# Patient Record
Sex: Male | Born: 1955
Health system: Southern US, Community
[De-identification: ages and names within clinical notes are randomized; demographics above are authoritative.]

## PROBLEM LIST (undated history)

## (undated) DIAGNOSIS — C8589 Other specified types of non-Hodgkin lymphoma, extranodal and solid organ sites: Secondary | ICD-10-CM

## (undated) DIAGNOSIS — K2270 Barrett's esophagus without dysplasia: Secondary | ICD-10-CM

## (undated) DIAGNOSIS — K649 Unspecified hemorrhoids: Secondary | ICD-10-CM

## (undated) DIAGNOSIS — Z8719 Personal history of other diseases of the digestive system: Secondary | ICD-10-CM

## (undated) DIAGNOSIS — C801 Malignant (primary) neoplasm, unspecified: Secondary | ICD-10-CM

## (undated) DIAGNOSIS — D126 Benign neoplasm of colon, unspecified: Secondary | ICD-10-CM

## (undated) HISTORY — PX: VASECTOMY: SHX75

## (undated) HISTORY — PX: NO PAST SURGERIES: SHX2092

## (undated) HISTORY — DX: Malignant (primary) neoplasm, unspecified: C80.1

## (undated) HISTORY — PX: COLONOSCOPY: SHX174

## (undated) HISTORY — DX: Other specified types of non-hodgkin lymphoma, extranodal and solid organ sites: C85.89

## (undated) HISTORY — DX: Barrett's esophagus without dysplasia: K22.70

## (undated) HISTORY — DX: Benign neoplasm of colon, unspecified: D12.6

---

## 1996-08-15 HISTORY — PX: VASECTOMY: SHX75

## 2000-05-15 DIAGNOSIS — D126 Benign neoplasm of colon, unspecified: Secondary | ICD-10-CM

## 2000-05-15 HISTORY — DX: Benign neoplasm of colon, unspecified: D12.6

## 2000-09-20 ENCOUNTER — Encounter (INDEPENDENT_AMBULATORY_CARE_PROVIDER_SITE_OTHER): Payer: Self-pay | Admitting: Specialist

## 2000-09-20 ENCOUNTER — Other Ambulatory Visit: Admission: RE | Admit: 2000-09-20 | Discharge: 2000-09-20 | Payer: Self-pay | Admitting: Gastroenterology

## 2002-05-20 ENCOUNTER — Other Ambulatory Visit: Admission: RE | Admit: 2002-05-20 | Discharge: 2002-05-20 | Payer: Self-pay | Admitting: Dermatology

## 2003-05-09 ENCOUNTER — Ambulatory Visit (HOSPITAL_COMMUNITY): Admission: RE | Admit: 2003-05-09 | Discharge: 2003-05-09 | Payer: Self-pay | Admitting: Pediatrics

## 2004-03-15 ENCOUNTER — Ambulatory Visit (HOSPITAL_COMMUNITY): Admission: RE | Admit: 2004-03-15 | Discharge: 2004-03-15 | Payer: Self-pay | Admitting: Orthopedic Surgery

## 2004-12-01 ENCOUNTER — Emergency Department (HOSPITAL_COMMUNITY): Admission: EM | Admit: 2004-12-01 | Discharge: 2004-12-02 | Payer: Self-pay | Admitting: Emergency Medicine

## 2005-04-08 ENCOUNTER — Emergency Department (HOSPITAL_COMMUNITY): Admission: EM | Admit: 2005-04-08 | Discharge: 2005-04-09 | Payer: Self-pay | Admitting: *Deleted

## 2007-04-06 ENCOUNTER — Ambulatory Visit (HOSPITAL_COMMUNITY): Admission: RE | Admit: 2007-04-06 | Discharge: 2007-04-06 | Payer: Self-pay | Admitting: Family Medicine

## 2008-10-01 ENCOUNTER — Encounter (INDEPENDENT_AMBULATORY_CARE_PROVIDER_SITE_OTHER): Payer: Self-pay | Admitting: *Deleted

## 2010-06-03 ENCOUNTER — Encounter (INDEPENDENT_AMBULATORY_CARE_PROVIDER_SITE_OTHER): Payer: Self-pay | Admitting: *Deleted

## 2010-07-26 ENCOUNTER — Encounter (INDEPENDENT_AMBULATORY_CARE_PROVIDER_SITE_OTHER): Payer: Self-pay | Admitting: *Deleted

## 2010-07-28 ENCOUNTER — Ambulatory Visit: Payer: Self-pay | Admitting: Gastroenterology

## 2010-08-03 ENCOUNTER — Ambulatory Visit: Payer: Self-pay | Admitting: Gastroenterology

## 2010-09-14 NOTE — Letter (Signed)
Summary: Pre Visit Letter Revised  [facility]  [address] [place], [place] [address]   Phone: [phone]  Fax: [phone]        06/03/2010 MRN: [id number] Adam Alvarado [address] [place], [place]  [address]  [place]             Procedure Date:  08-03-10   Welcome to the Gastroenterology Division at [facility].    You are scheduled to see a nurse for your pre-procedure visit on 07-28-10 at 9:00a.m. on the 3rd floor at [facility], [address]  We ask that you try to arrive at our office 15 minutes prior to your appointment time to allow for check-in.  Please take a minute to review the attached form.  If you answer "Yes" to one or more of the questions on the first page, we ask that you call the person listed at your earliest opportunity.  If you answer "No" to all of the questions, please complete the rest of the form and bring it to your appointment.    Your nurse visit will consist of discussing your medical and surgical history, your immediate family medical history, and your medications.   If you are unable to list all of your medications on the form, please bring the medication bottles to your appointment and we will list them.  We will need to be aware of both prescribed and over the counter drugs.  We will need to know exact dosage information as well.    Please be prepared to read and sign documents such as consent forms, a financial agreement, and acknowledgement forms.  If necessary, and with your consent, a friend or relative is welcome to sit-in on the nurse visit with you.  Please bring your insurance card so that we may make a copy of it.  If your insurance requires a referral to see a specialist, please bring your referral form from your primary care physician.  No co-pay is required for this nurse visit.     If you cannot keep your appointment, please call [phone] to cancel or reschedule prior to your appointment date.  This  allows [place] the opportunity to schedule an appointment for another patient in need of care.    Thank you for choosing [facility] for your medical needs.  We appreciate the opportunity to care for you.  Please visit [place] at our website  to learn more about our practice.  Sincerely, The Gastroenterology Division

## 2010-09-16 NOTE — Miscellaneous (Signed)
Summary: [facility] Previsit/prep  Clinical Lists Changes  Medications: Added new medication of MOVIPREP 100 GM  SOLR (PEG-KCL-NACL-NASULF-NA ASC-C) As per prep instructions. - Signed Rx of MOVIPREP 100 GM  SOLR (PEG-KCL-NACL-NASULF-NA ASC-C) As per prep instructions.;  #1 x 0;  Signed;  Entered by: Wyona Almas RN;  Authorized by: Meryl Dare MD [facility];  Method used: Electronically to [facility] [address], [place], [place], [place]  [address], Ph: [phone], Fax: [phone] Observations: Added new observation of NKA: T (07/28/2010 8:49)    Prescriptions: MOVIPREP 100 GM  SOLR (PEG-KCL-NACL-NASULF-NA ASC-C) As per prep instructions.  #1 x 0   Entered by:   Wyona Almas RN   Authorized by:   Meryl Dare MD [facility]   Signed by:   Wyona Almas RN on 07/28/2010   Method used:   Electronically to        [facility]* (retail)       [address]       [place], [place]  [address]       Ph: [phone]       Fax: [phone]   RxID:   [id number]

## 2010-09-16 NOTE — Procedures (Signed)
Summary: Colonoscopy  Patient: Adam Alvarado Note: All result statuses are Final unless otherwise noted.  Tests: (1) Colonoscopy (COL)   COL Colonoscopy           DONE     [facility]     [address]     [place], [place]  [address]           COLONOSCOPY PROCEDURE REPORT     PATIENT:  Adam, Alvarado  MR#:  [id number]     BIRTHDATE:  1956-01-30, 54 yrs. old  GENDER:  male     ENDOSCOPIST:  Judie Petit T. Russella Dar, MD, [facility]           PROCEDURE DATE:  08/03/2010     PROCEDURE:  Colon w/ inj sclerosis of hemorrhoids     ASA CLASS:  Class I     INDICATIONS:  1) surveillance and high-risk screening  2) history     of adenomatous colon polyps: 05/2000     MEDICATIONS:   Fentanyl 100 mcg IV, Versed 12 mg     DESCRIPTION OF PROCEDURE:   After the risks benefits and     alternatives of the procedure were thoroughly explained, informed     consent was obtained.  Digital rectal exam was performed and     revealed external hemorrhoids.   The LB [id number]-[id number]     endoscope was introduced through the anus and advanced to the     cecum, which was identified by both the appendix and ileocecal     valve, without limitations.  The quality of the prep was good,     using MoviPrep.  The instrument was then slowly withdrawn as the     colon was fully examined.     <<PROCEDUREIMAGES>>     FINDINGS:  A normal appearing cecum, ileocecal valve, and     appendiceal orifice were identified. The ascending, hepatic     flexure, transverse, splenic flexure, descending, sigmoid colon,     and rectum appeared unremarkable.  Internal hemorrhoids were     found. They were moderately sized. Injection sclerosis of internal     hemorrhoids with 2.5 cc of 23.4% saline injected above the dentate     line. Retroflexed views in the rectum revealed no other findings     other than those already described. The time to cecum =  3     minutes. The scope was then withdrawn (time =  11.5  min) from the     patient  and the procedure completed.           COMPLICATIONS: None           ENDOSCOPIC IMPRESSION:     1) Normal colon     2) Internal and external hemorrhoids           RECOMMENDATIONS:     1) Repeat Colonoscopy in 5 years.           Venita Lick. Russella Dar, MD, [facility]           CC: Milford Cage Francoise Schaumann MD           n.     Rosalie DoctorVenita Lick. Stark at 08/03/2010 09:31 AM           Corney, Perlie Gold, [phone]  Note: An exclamation mark (!) indicates a result that was not dispersed into the flowsheet. Document Creation Date: 08/03/2010 9:31 AM _______________________________________________________________________  (1) Order result status: Final Collection or observation date-time: 08/03/2010  09:26 Requested date-time:  Receipt date-time:  Reported date-time:  Referring Physician:   Ordering Physician: Claudette Head [phone]) Specimen Source:  Source: Launa Grill Order Number: [id number] Lab site:   Appended Document: Colonoscopy    Clinical Lists Changes  Observations: Added new observation of COLONNXTDUE: 07/2015 (08/03/2010 9:36)

## 2010-09-16 NOTE — Letter (Signed)
Summary: [facility] Instructions  [facility]  [address] [place], [place] [address]   Phone: [phone]  Fax: [phone]       Adam Alvarado    02/23/1956    MRN: [id number]        Procedure Day /Date:TUESDAY  08/03/10     Arrival Time:  8:00AM     Procedure Time:  9:00AM     Location of Procedure:                    [name] _  [facility] (4th Floor)                      PREPARATION FOR COLONOSCOPY WITH MOVIPREP   Starting 5 days prior to your procedure 07/29/10 do not eat nuts, seeds, popcorn, corn, beans, peas,  salads, or any raw vegetables.  Do not take any fiber supplements (e.g. Metamucil, Citrucel, and Benefiber).  THE DAY BEFORE YOUR PROCEDURE         DATE: 08/02/10  DAY: MONDAY  1.  Drink clear liquids the entire day-NO SOLID FOOD  2.  Do not drink anything colored red or purple.  Avoid juices with pulp.  No orange juice.  3.  Drink at least 64 oz. (8 glasses) of fluid/clear liquids during the day to prevent dehydration and help the prep work efficiently.  CLEAR LIQUIDS INCLUDE: Water Jello Ice Popsicles Tea (sugar ok, no milk/cream) Powdered fruit flavored drinks Coffee (sugar ok, no milk/cream) Gatorade Juice: apple, white grape, white cranberry  Lemonade Clear bullion, consomm, broth Carbonated beverages (any kind) Strained chicken noodle soup Hard Candy                             4.  In the morning, mix first dose of MoviPrep solution:    Empty 1 Pouch A and 1 Pouch B into the disposable container    Add lukewarm drinking water to the top line of the container. Mix to dissolve    Refrigerate (mixed solution should be used within 24 hrs)  5.  Begin drinking the prep at 5:00 p.m. The MoviPrep container is divided by 4 marks.   Every 15 minutes drink the solution down to the next mark (approximately 8 oz) until the full liter is complete.   6.  Follow completed prep with 16 oz of clear liquid of your choice (Nothing  red or purple).  Continue to drink clear liquids until bedtime.  7.  Before going to bed, mix second dose of MoviPrep solution:    Empty 1 Pouch A and 1 Pouch B into the disposable container    Add lukewarm drinking water to the top line of the container. Mix to dissolve    Refrigerate  THE DAY OF YOUR PROCEDURE      DATE: 08/03/10   DAY: TUESDAY  Beginning at 4:00AM (5 hours before procedure):         1. Every 15 minutes, drink the solution down to the next mark (approx 8 oz) until the full liter is complete.  2. Follow completed prep with 16 oz. of clear liquid of your choice.    3. You may drink clear liquids until 7:00AM (2 HOURS BEFORE PROCEDURE).   MEDICATION INSTRUCTIONS  Unless otherwise instructed, you should take regular prescription medications with a small sip of water   as early as possible the morning of your procedure.  OTHER INSTRUCTIONS  You will need a responsible adult at least 54 years of age to accompany you and drive you home.   This person must remain in the waiting room during your procedure.  Wear loose fitting clothing that is easily removed.  Leave jewelry and other valuables at home.  However, you may wish to bring a book to read or  an iPod/MP3 player to listen to music as you wait for your procedure to start.  Remove all body piercing jewelry and leave at home.  Total time from sign-in until discharge is approximately 2-3 hours.  You should go home directly after your procedure and rest.  You can resume normal activities the  day after your procedure.  The day of your procedure you should not:   Drive   Make legal decisions   Operate machinery   Drink alcohol   Return to work  You will receive specific instructions about eating, activities and medications before you leave.    The above instructions have been reviewed and explained to me by  Adam Almas RN  July 28, 2010 9:33 AM     I fully understand and  can verbalize these instructions _____________________________ Date _________

## 2010-12-31 NOTE — Procedures (Signed)
   NAME:  Adam Alvarado, Adam Alvarado                         ACCOUNT NO.:  [id number]   MEDICAL RECORD NO.:  [id number]                   PATIENT TYPE:  OUT   LOCATION:  [facility]                                 FACILITY:  [facility]   PHYSICIAN:  Francoise Schaumann. Halm, D.O.                DATE OF BIRTH:  1956/07/03   DATE OF PROCEDURE:  05/09/2003  DATE OF DISCHARGE:                                    STRESS TEST   REASON FOR STUDY:  Near-syncopal episode.   BRIEF HISTORY:  The patient is a 55 year old gentleman who experienced near  syncope while cutting down a tree within weeks prior to this study.  The  study is performed to rule out myocardial ischemia as a cause of his near  syncope.   The patient underwent Bruce protocol exercise treadmill testing under  standardized conditions.  He had normal initial EKG with no evidence of LVH  or bundle-branch block.  His initial blood pressure was normal.  The patient  signed the informed consent form.  The patient underwent Bruce protocol  treadmill testing and had excellent tolerance to exercise.  The study was  stopped due to the patient reaching his target heart rate of 148.  The  patient actually exceeded his heart rate.  He had no recurrence of his  symptoms.  The patient had normal hemodynamic response to exercise.  EKG at  maximal exertion did show 1 mm ST depression which resolved quickly within  one minute of recovery.  The patient had no symptoms during the study  consistent with ischemia.  There was also no dysrhythmia noted during  exercise.   CONCLUSION:  Optimal treadmill exercise test with no evidence of ischemia.      ___________________________________________                                            Francoise Schaumann. Milford Cage, D.O.   [facility]/MEDQ  D:  05/09/2003  T:  05/09/2003  Job:  [id number]

## 2011-08-16 DIAGNOSIS — C801 Malignant (primary) neoplasm, unspecified: Secondary | ICD-10-CM

## 2011-08-16 DIAGNOSIS — C8589 Other specified types of non-Hodgkin lymphoma, extranodal and solid organ sites: Secondary | ICD-10-CM

## 2011-08-16 HISTORY — DX: Malignant (primary) neoplasm, unspecified: C80.1

## 2011-08-16 HISTORY — DX: Other specified types of non-hodgkin lymphoma, extranodal and solid organ sites: C85.89

## 2012-07-05 ENCOUNTER — Other Ambulatory Visit (HOSPITAL_COMMUNITY): Payer: Self-pay | Admitting: Internal Medicine

## 2012-07-05 DIAGNOSIS — R198 Other specified symptoms and signs involving the digestive system and abdomen: Secondary | ICD-10-CM

## 2012-07-10 ENCOUNTER — Ambulatory Visit (HOSPITAL_COMMUNITY)
Admission: RE | Admit: 2012-07-10 | Discharge: 2012-07-10 | Disposition: A | Discharge status: Home / Self Care | Payer: BC Managed Care – PPO | Source: Ambulatory Visit | Attending: Internal Medicine | Admitting: Internal Medicine

## 2012-07-10 ENCOUNTER — Other Ambulatory Visit (HOSPITAL_COMMUNITY): Payer: Self-pay | Admitting: Internal Medicine

## 2012-07-10 DIAGNOSIS — K824 Cholesterolosis of gallbladder: Secondary | ICD-10-CM | POA: Insufficient documentation

## 2012-07-10 DIAGNOSIS — R198 Other specified symptoms and signs involving the digestive system and abdomen: Secondary | ICD-10-CM

## 2012-07-10 DIAGNOSIS — R1900 Intra-abdominal and pelvic swelling, mass and lump, unspecified site: Secondary | ICD-10-CM | POA: Insufficient documentation

## 2012-07-10 MED ORDER — IOHEXOL 300 MG/ML  SOLN
100.0000 mL | Freq: Once | INTRAMUSCULAR | Status: AC | PRN
  Administered 2012-07-10: 100 mL via INTRAVENOUS

## 2012-07-11 ENCOUNTER — Other Ambulatory Visit: Payer: Self-pay | Admitting: *Deleted

## 2012-07-11 ENCOUNTER — Telehealth: Payer: Self-pay | Admitting: Oncology

## 2012-07-11 DIAGNOSIS — R1900 Intra-abdominal and pelvic swelling, mass and lump, unspecified site: Secondary | ICD-10-CM

## 2012-07-11 NOTE — Telephone Encounter (Signed)
S/W pt in re NP appt 12/04 @ 11:30 w/Dr. Truett Perna.  Referring Dr. Ouida Sills Dx-Lung Mass Welcome packet mailed.

## 2012-07-17 ENCOUNTER — Telehealth: Payer: Self-pay | Admitting: Oncology

## 2012-07-17 ENCOUNTER — Ambulatory Visit (HOSPITAL_COMMUNITY): Payer: BC Managed Care – PPO | Admitting: Oncology

## 2012-07-17 NOTE — Telephone Encounter (Signed)
C/D 07/17/12 for appt 07/18/12

## 2012-07-18 ENCOUNTER — Other Ambulatory Visit: Payer: BC Managed Care – PPO

## 2012-07-18 ENCOUNTER — Ambulatory Visit (HOSPITAL_BASED_OUTPATIENT_CLINIC_OR_DEPARTMENT_OTHER): Payer: BC Managed Care – PPO | Admitting: Oncology

## 2012-07-18 ENCOUNTER — Ambulatory Visit: Payer: BC Managed Care – PPO

## 2012-07-18 ENCOUNTER — Other Ambulatory Visit (HOSPITAL_BASED_OUTPATIENT_CLINIC_OR_DEPARTMENT_OTHER): Payer: BC Managed Care – PPO

## 2012-07-18 ENCOUNTER — Telehealth: Payer: Self-pay | Admitting: Oncology

## 2012-07-18 VITALS — BP 150/78 | HR 61 | Temp 97.7°F | Resp 18 | Ht 70.5 in | Wt 230.0 lb

## 2012-07-18 DIAGNOSIS — R1900 Intra-abdominal and pelvic swelling, mass and lump, unspecified site: Secondary | ICD-10-CM

## 2012-07-18 LAB — COMPREHENSIVE METABOLIC PANEL (CC13)
Albumin: 4.2 g/dL (ref 3.5–5.0)
Alkaline Phosphatase: 96 U/L (ref 40–150)
BUN: 14 mg/dL (ref 7.0–26.0)
Calcium: 9.9 mg/dL (ref 8.4–10.4)
Glucose: 98 mg/dl (ref 70–99)
Potassium: 4.6 mEq/L (ref 3.5–5.1)
Sodium: 142 mEq/L (ref 136–145)

## 2012-07-18 LAB — CBC WITH DIFFERENTIAL/PLATELET
Basophils Absolute: 0 10*3/uL (ref 0.0–0.1)
Eosinophils Absolute: 0.1 10*3/uL (ref 0.0–0.5)
HCT: 48 % (ref 38.4–49.9)
HGB: 16 g/dL (ref 13.0–17.1)
LYMPH%: 12 % — ABNORMAL LOW (ref 14.0–49.0)
MONO%: 12.7 % (ref 0.0–14.0)
NEUT#: 4.7 10*3/uL (ref 1.5–6.5)
Platelets: 225 10*3/uL (ref 140–400)
RBC: 5.48 10*6/uL (ref 4.20–5.82)
WBC: 6.3 10*3/uL (ref 4.0–10.3)

## 2012-07-18 NOTE — Progress Notes (Signed)
[facility] New Patient Consult   Referring MD: Dayron Odland Brierley 56 y.o.  Nov 02, 1955    Reason for Referral: Abdominal mass     HPI: He saw Dr. Ouida Sills to establish as the patient and on physical exam a left abdominal fullness was noted. An abdominal ultrasound on 07/10/2012 revealed a heterogenous mass in the vicinity of the pancreas and central abdomen measuring 19.3 x 10 cm. A CT was recommended. A CT on the same day revealed a normal liver, spleen, gallbladder, pancreas, and adrenal glands. A large mass was noted in the mid abdomen centrally and to the left of midline displacing bowel the blood vessels extending through the mass without evidence of encasement or displacement. Surround the mass numerous small oval soft tissue nodules were consistent with lymph nodes. Small retroperitoneal nodes with the largest measuring 18 x 6 mm in the periaortic region. The CT findings were felt to be most consistent with lymphoma.  Mr. Flenner feels well.  Past medical history: None  Past surgical history: Mastectomy  Current medications: None  Family history: His mother had breast cancer in her 13s. A second cousin had non-Hodgkin's lymphoma. No other family history of cancer.  Allergies: No Known Allergies  Social History: He works in [employer]. He does not smoke. Rare alcohol use. No transfusion history. No risk factor for HIV or hepatitis. No unusual exposure a side from embalming chemicals.   ROS:   Positives include: Few night sweats over the past several months, increased flatulence and intermittent loose stool, "gags "when brushing his tongue and vomits occasionally after brushing his tongue for the past few months. He had a "viral "illness for a few days in September  A complete ROS was otherwise negative.  Physical Exam:  Blood pressure 150/78, pulse 61, temperature 97.7 F (36.5 C), temperature source Oral, resp. rate 18, height 5'  10.5" (1.791 m), weight 230 lb (104.327 kg).  HEENT: Oropharynx without visible mass, neck without mass Lungs: Clear bilaterally Cardiac: Regular rate and rhythm Abdomen: There is a mass filling the majority of the left abdomen. No hepatomegaly, no apparent ascites GU: Testes without mass  Vascular: No leg edema Lymph nodes: No cervical, supraclavicular, axillary, or inguinal nodes Neurologic: Alert and oriented, the motor exam appears intact in the upper and lower extremities Skin: No rash Musculoskeletal: No spine tenderness   LAB:  CBC  Lab Results  Component Value Date   WBC 6.3 07/18/2012   HGB 16.0 07/18/2012   HCT 48.0 07/18/2012   MCV 87.5 07/18/2012   PLT 225 07/18/2012   ANC 4.7  CMP      Component Value Date/Time   NA 142 07/18/2012 1054   K 4.6 07/18/2012 1054   CL 102 07/18/2012 1054   CO2 30* 07/18/2012 1054   GLUCOSE 98 07/18/2012 1054   BUN 14.0 07/18/2012 1054   CREATININE 1.3 07/18/2012 1054   CALCIUM 9.9 07/18/2012 1054   PROT 7.1 07/18/2012 1054   ALBUMIN 4.2 07/18/2012 1054   AST 18 07/18/2012 1054   ALT 20 07/18/2012 1054   ALKPHOS 96 07/18/2012 1054   BILITOT 0.53 07/18/2012 1054   LDH 196  Radiology: I reviewed the abdominal CT from 07/10/2012 with Mr. Nygard and his wife. There is a large abdominal mass centered to the left of midline.    Assessment/Plan:   1. Large Abdominal mass with surrounding small lymph nodes  2. Episodes of vomiting after brushing his tongue,?  Related to the abdominal mass   Disposition:   He was discovered to have a large abdominal mass when he was seen for a routine physical. I reviewed the CT images and discussed the differential diagnosis with Mr. Kirkendall and his wife. The differential diagnosis includes lymphoma and other malignancies. The abdominal mass appears to be amenable to a percutaneous core biopsy. He will be referred to interventional radiology for a core biopsy to be sent for a lymphoma evaluation.  Mr. Mcgrail  will return for an office visit on 07/26/2012. We will initiate additional staging evaluation based on the biopsy result.  Walsie Smeltz 07/18/2012, 8:21 PM

## 2012-07-18 NOTE — Telephone Encounter (Signed)
gv pt appt schedule for December. Pt aware central will contact him w/appt for bx. Per pt he has already been to [facility] and BS asked him to let the schedulers know to disregard request on pof to send him to [facility].

## 2012-07-19 ENCOUNTER — Other Ambulatory Visit (HOSPITAL_COMMUNITY): Payer: Self-pay | Admitting: Oncology

## 2012-07-19 ENCOUNTER — Other Ambulatory Visit: Payer: Self-pay | Admitting: Radiology

## 2012-07-20 ENCOUNTER — Encounter (HOSPITAL_COMMUNITY): Payer: Self-pay

## 2012-07-20 ENCOUNTER — Ambulatory Visit (HOSPITAL_COMMUNITY)
Admission: RE | Admit: 2012-07-20 | Discharge: 2012-07-20 | Disposition: A | Discharge status: Home / Self Care | Payer: BC Managed Care – PPO | Source: Ambulatory Visit | Attending: Oncology | Admitting: Oncology

## 2012-07-20 DIAGNOSIS — R1900 Intra-abdominal and pelvic swelling, mass and lump, unspecified site: Secondary | ICD-10-CM

## 2012-07-20 DIAGNOSIS — Z807 Family history of other malignant neoplasms of lymphoid, hematopoietic and related tissues: Secondary | ICD-10-CM | POA: Insufficient documentation

## 2012-07-20 DIAGNOSIS — Z803 Family history of malignant neoplasm of breast: Secondary | ICD-10-CM | POA: Insufficient documentation

## 2012-07-20 DIAGNOSIS — R1909 Other intra-abdominal and pelvic swelling, mass and lump: Secondary | ICD-10-CM | POA: Insufficient documentation

## 2012-07-20 LAB — PROTIME-INR
INR: 1.05 (ref 0.00–1.49)
Prothrombin Time: 13.6 seconds (ref 11.6–15.2)

## 2012-07-20 MED ORDER — MIDAZOLAM HCL 2 MG/2ML IJ SOLN
INTRAMUSCULAR | Status: AC
  Filled 2012-07-20: qty 4

## 2012-07-20 MED ORDER — HYDROCODONE-ACETAMINOPHEN 5-325 MG PO TABS
1.0000 | ORAL_TABLET | ORAL | Status: DC | PRN
  Filled 2012-07-20: qty 2

## 2012-07-20 MED ORDER — FENTANYL CITRATE 0.05 MG/ML IJ SOLN
INTRAMUSCULAR | Status: AC
  Filled 2012-07-20: qty 4

## 2012-07-20 MED ORDER — SODIUM CHLORIDE 0.9 % IV SOLN
Freq: Once | INTRAVENOUS | Status: AC
  Administered 2012-07-20: 08:00:00 via INTRAVENOUS

## 2012-07-20 MED ORDER — MIDAZOLAM HCL 2 MG/2ML IJ SOLN
INTRAMUSCULAR | Status: AC | PRN
  Administered 2012-07-20: 2 mg via INTRAVENOUS

## 2012-07-20 MED ORDER — FENTANYL CITRATE 0.05 MG/ML IJ SOLN
INTRAMUSCULAR | Status: AC | PRN
  Administered 2012-07-20: 100 ug via INTRAVENOUS

## 2012-07-20 NOTE — H&P (Signed)
Agree 

## 2012-07-20 NOTE — H&P (Signed)
Adam Alvarado is an 56 y.o. male.   Chief Complaint: abdominal mass HPI: Patient with history of large mid abdominal mass suspicious for lymphoma presents today for CT guided biopsy.  ZOX:WRUE AVW:UJWJXBJYN FH:    Mother had breast cancer; second cousin with NHL.                                                                                                                                                                                                                                    Social History:  reports that he has never smoked. He has never used smokeless tobacco. 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   Allergies: No Known Allergies  No current outpatient prescriptions on file. Current facility-administered medications:[COMPLETED] 0.9 %  sodium chloride infusion, , Intravenous, Once, Brayton El, PA, Last Rate: 20 mL/hr at 07/20/12 0800   Results for orders placed in visit on 07/18/12 (from the past 48 hour(s))  CBC WITH DIFFERENTIAL     Status: Abnormal   Collection Time   07/18/12 10:54 AM      Component Value Range Comment   WBC 6.3  4.0 - 10.3 10e3/uL    NEUT# 4.7  1.5 - 6.5 10e3/uL    HGB 16.0  13.0 - 17.1 g/dL    HCT 82.9  56.2 - 13.0 %    Platelets 225  140 - 400 10e3/uL    MCV 87.5  79.3 - 98.0 fL    MCH 29.2  27.2 - 33.4 pg    MCHC 33.3  32.0 - 36.0 g/dL    RBC 8.65  7.84 - 6.96 10e6/uL    RDW 13.4  11.0 - 14.6 %    lymph# 0.8 (*) 0.9 - 3.3 10e3/uL    MONO# 0.8  0.1 - 0.9 10e3/uL    Eosinophils Absolute 0.1  0.0 - 0.5 10e3/uL    Basophils Absolute 0.0  0.0 - 0.1 10e3/uL    NEUT% 73.7  39.0 - 75.0 %    LYMPH% 12.0 (*) 14.0 - 49.0 %    MONO% 12.7  0.0 - 14.0 %    EOS% 1.1  0.0 - 7.0 %    BASO% 0.5  0.0 - 2.0 %   LACTATE DEHYDROGENASE (CC13)     Status: Normal   Collection Time   07/18/12 10:54 AM      Component Value  Range Comment   LDH 196  125 - 245 U/L 06/21/12 - NOTE new reference range.  COMPREHENSIVE  METABOLIC PANEL (CC13)     Status: Abnormal   Collection Time   07/18/12 10:54 AM      Component Value Range Comment   Sodium 142  136 - 145 mEq/L    Potassium 4.6  3.5 - 5.1 mEq/L    Chloride 102  98 - 107 mEq/L    CO2 30 (*) 22 - 29 mEq/L    Glucose 98  70 - 99 mg/dl    BUN 16.1  7.0 - 09.6 mg/dL    Creatinine 1.3  0.7 - 1.3 mg/dL    Total Bilirubin 0.45  0.20 - 1.20 mg/dL    Alkaline Phosphatase 96  40 - 150 U/L    AST 18  5 - 34 U/L    ALT 20  0 - 55 U/L    Total Protein 7.1  6.4 - 8.3 g/dL    Albumin 4.2  3.5 - 5.0 g/dL    Calcium 9.9  8.4 - 40.9 mg/dL    Results for orders placed in visit on 07/18/12  CBC WITH DIFFERENTIAL      Component Value Range   WBC 6.3  4.0 - 10.3 10e3/uL   NEUT# 4.7  1.5 - 6.5 10e3/uL   HGB 16.0  13.0 - 17.1 g/dL   HCT 81.1  91.4 - 78.2 %   Platelets 225  140 - 400 10e3/uL   MCV 87.5  79.3 - 98.0 fL   MCH 29.2  27.2 - 33.4 pg   MCHC 33.3  32.0 - 36.0 g/dL   RBC 9.56  2.13 - 0.86 10e6/uL   RDW 13.4  11.0 - 14.6 %   lymph# 0.8 (*) 0.9 - 3.3 10e3/uL   MONO# 0.8  0.1 - 0.9 10e3/uL   Eosinophils Absolute 0.1  0.0 - 0.5 10e3/uL   Basophils Absolute 0.0  0.0 - 0.1 10e3/uL   NEUT% 73.7  39.0 - 75.0 %   LYMPH% 12.0 (*) 14.0 - 49.0 %   MONO% 12.7  0.0 - 14.0 %   EOS% 1.1  0.0 - 7.0 %   BASO% 0.5  0.0 - 2.0 %  LACTATE DEHYDROGENASE (CC13)      Component Value Range   LDH 196  125 - 245 U/L  COMPREHENSIVE METABOLIC PANEL (CC13)      Component Value Range   Sodium 142  136 - 145 mEq/L   Potassium 4.6  3.5 - 5.1 mEq/L   Chloride 102  98 - 107 mEq/L   CO2 30 (*) 22 - 29 mEq/L   Glucose 98  70 - 99 mg/dl   BUN 57.8  7.0 - 46.9 mg/dL   Creatinine 1.3  0.7 - 1.3 mg/dL   Total Bilirubin 6.29  0.20 - 1.20 mg/dL   Alkaline Phosphatase 96  40 - 150 U/L   AST 18  5 - 34 U/L   ALT 20  0 - 55 U/L   Total Protein 7.1  6.4 - 8.3 g/dL   Albumin 4.2  3.5 - 5.0 g/dL   Calcium 9.9  8.4 - 52.8 mg/dL   PT/PTT pending  41/10/2438 Review of Systems   Constitutional: Negative for fever and weight loss.       Few night sweats over last several months  Respiratory: Negative for cough and shortness of breath.   Cardiovascular: Negative for chest pain.  Gastrointestinal: Negative for nausea  and abdominal pain.       Occ vomiting after brushing tongue  Musculoskeletal: Negative for back pain.  Neurological: Negative for headaches.  Endo/Heme/Allergies: Does not bruise/bleed easily.    Blood pressure 150/69, pulse 59, temperature 98.3 F (36.8 C), temperature source Oral, resp. rate 16, height 5\' 10"  (1.778 m), weight 230 lb (104.327 kg), SpO2 98.00%. Physical Exam  Constitutional: He is oriented to person, place, and time. He appears well-developed and well-nourished.  Cardiovascular: Normal rate and regular rhythm.   Respiratory: Effort normal and breath sounds normal.  GI: Soft. Bowel sounds are normal.       Palpable abdominal mass, sl to left of midline  Musculoskeletal: Normal range of motion. He exhibits no edema.  Neurological: He is alert and oriented to person, place, and time.     Assessment/Plan: Pt with large mid abdominal mass . Plan is for CT guided biopsy of the abdominal mass today. Details/risks of procedure d/w pt/wife with their understanding and consent. ALLRED,D KEVIN 07/20/2012, 7:58 AM

## 2012-07-20 NOTE — Procedures (Signed)
Procedure:  CT guided core biopsy of mesenteric mass Findings:  Solid 18 G core biopsy x 6 via 17 G needle of large left mesenteric mass.  No complications.

## 2012-07-26 ENCOUNTER — Telehealth: Payer: Self-pay | Admitting: Oncology

## 2012-07-26 ENCOUNTER — Ambulatory Visit (HOSPITAL_BASED_OUTPATIENT_CLINIC_OR_DEPARTMENT_OTHER): Payer: BC Managed Care – PPO | Admitting: Oncology

## 2012-07-26 VITALS — BP 134/79 | HR 64 | Temp 97.9°F | Resp 18 | Ht 70.0 in | Wt 224.0 lb

## 2012-07-26 DIAGNOSIS — R197 Diarrhea, unspecified: Secondary | ICD-10-CM

## 2012-07-26 DIAGNOSIS — R1900 Intra-abdominal and pelvic swelling, mass and lump, unspecified site: Secondary | ICD-10-CM

## 2012-07-26 DIAGNOSIS — R1110 Vomiting, unspecified: Secondary | ICD-10-CM

## 2012-07-26 DIAGNOSIS — C8589 Other specified types of non-Hodgkin lymphoma, extranodal and solid organ sites: Secondary | ICD-10-CM

## 2012-07-26 NOTE — Progress Notes (Signed)
   Adam Alvarado    OFFICE PROGRESS NOTE   INTERVAL HISTORY:   He returns as scheduled. He underwent a CT-guided biopsy of the abdominal mass on 07/20/2012. He reports tolerating the procedure well. The pathology revealed "lymphoid atypia "consistent with a B-cell non-Hodgkin's lymphoma.  He reports fullness in the left abdomen. He had one episode of emesis since the last office visit. He now has frequent "loose "stools after eating.  Objective:  Vital signs in last 24 hours:  Blood pressure 134/79, pulse 64, temperature 97.9 F (36.6 C), temperature source Oral, resp. rate 18, height 5\' 10"  (1.778 m), weight 224 lb (101.606 kg).  Resp: Lungs clear bilaterally Cardio: Regular rate and rhythm GI: Palpable mass in the left abdomen, no hepatomegaly Vascular: No leg edema   Lab Results:  Lab Results  Component Value Date   WBC 6.3 07/18/2012   HGB 16.0 07/18/2012   HCT 48.0 07/18/2012   MCV 87.5 07/18/2012   PLT 225 07/18/2012   BUN 14, creatinine 1.3, LDH 196 on 07/18/2012    Medications: I have reviewed the patient's current medications.  Assessment/Plan: 1. Large Abdominal mass with surrounding small lymph nodes -status post a CT-guided biopsy on 07/20/2012 with the pathology suggestive of non-Hodgkin's lymphoma. 2. Episodes of vomiting after brushing his tongue,? Related to the abdominal mass  3. Loose stools after eating-potentially related to the abdominal mass  Disposition:  He appears stable. I discussed the biopsy findings with Dr. Laureen Ochs of hematopathology. He indicates a definitive diagnosis of lymphoma cannot be made off of the core biopsy material. He recommends an incisional biopsy.  I contacted Dr. Derrell Lolling and he graciously agreed to see Adam Alvarado on 07/30/2012. He will schedule the biopsy after this visit. Adam Alvarado will return for an office visit after the biopsy to formulate a treatment plan. A bone marrow biopsy and staging PET scan will be  scheduled as indicated based on the final pathology result.  He is scheduled for a return visit here on 08/10/2012. He will contact us in the interim for new symptoms.  We discussed the likely need for treatment with systemic therapy.  Approximately 30 minutes were spent with patient today. The majority of the time was spent in counseling/coordination of care.   [name], MD  07/26/2012  8:08 PM

## 2012-07-26 NOTE — Telephone Encounter (Signed)
Gave pt appt for December 2013 MD visit only , pt will see Dr. Derrell Lolling on 07/30/12

## 2012-07-30 ENCOUNTER — Encounter (INDEPENDENT_AMBULATORY_CARE_PROVIDER_SITE_OTHER): Payer: Self-pay | Admitting: General Surgery

## 2012-07-30 ENCOUNTER — Ambulatory Visit (INDEPENDENT_AMBULATORY_CARE_PROVIDER_SITE_OTHER): Payer: BC Managed Care – PPO | Admitting: General Surgery

## 2012-07-30 VITALS — BP 148/86 | HR 71 | Temp 98.2°F | Resp 16 | Ht 70.0 in | Wt 226.0 lb

## 2012-07-30 DIAGNOSIS — R1902 Left upper quadrant abdominal swelling, mass and lump: Secondary | ICD-10-CM | POA: Insufficient documentation

## 2012-07-30 NOTE — Patient Instructions (Signed)
You have an abdominal mass in the left upper quadrant which is almost certainly some type of lymphoma. We cannot find any lymph nodes elsewhere to biopsy.  You'll be scheduled for laparoscopy and biopsy of this mass, possible open laparotomy in the near future. He will need to stay in the hospital a night or two after the surgery for observation.

## 2012-07-30 NOTE — Progress Notes (Signed)
Patient ID: Adam Alvarado, male   DOB: 1955-12-19, 56 y.o.   MRN: [id number]  No chief complaint on file.   HPI Adam Alvarado is a 56 y.o. male.  He is referred by Dr. Mancel Bale for evaluation of abdominal mass, presumed lymphoma, for laparoscopic versus open biopsy. His primary care physician is Dr. [name].  The patient is asymptomatic. No weight loss. No night sweats. No bowel pain or change in his GI habits. Dr. Ouida Sills felt something in his upper abdomen. This led to an ultrasound and a CT scan. CT scan shows a 12 x 12 x 20 cm mass in the upper abdomen intimately associated with the jejunal mesentery and, this abuts the aorta and ligament of Treitz. In the upper outer area of this on the left side there does not appear to be as many vessels. A needle biopsy was not diagnosed diagnostic. They're pretty sure this is some type of lymphoma but could not give a type. Dr. Truett Perna needs more tissue to subategorize the lymphoma and direct therapy  The patient is otherwise pretty healthy healthy other than some obesity. He lives in [place]. HPI  Past Medical History  Diagnosis Date  . Cancer 2013    Non-Hodgkins Lymphoma  . Other malignant lymphomas, unspecified site, extranodal and solid organ sites 2013    Past Surgical History  Procedure Date  . Vasectomy   . Vasectomy 1998    Family History  Problem Relation Age of Onset  . Cancer Mother     Breast    Social History History  Substance Use Topics  . Smoking status: Never Smoker   . Smokeless tobacco: Never Used  . Alcohol Use: Yes     Comment: on occassion    No Known Allergies  No current outpatient prescriptions on file.    Review of Systems Review of Systems  Constitutional: Negative for fever, chills and unexpected weight change.  HENT: Negative for hearing loss, congestion, sore throat, trouble swallowing and voice change.   Eyes: Negative for visual disturbance.  Respiratory: Negative for cough  and wheezing.   Cardiovascular: Negative for chest pain, palpitations and leg swelling.  Gastrointestinal: Negative for nausea, vomiting, abdominal pain, diarrhea, constipation, blood in stool, abdominal distention, anal bleeding and rectal pain.  Genitourinary: Negative for hematuria and difficulty urinating.  Musculoskeletal: Negative for arthralgias.  Skin: Negative for rash and wound.  Neurological: Negative for seizures, syncope, weakness and headaches.  Hematological: Negative for adenopathy. Does not bruise/bleed easily.  Psychiatric/Behavioral: Negative for confusion.    Blood pressure 148/86, pulse 71, temperature 98.2 F (36.8 C), temperature source Temporal, resp. rate 16, height 5\' 10"  (1.778 m), weight 226 lb (102.513 kg).  Physical Exam Physical Exam  Constitutional: He is oriented to person, place, and time. He appears well-developed and well-nourished. No distress.  HENT:  Head: Normocephalic.  Nose: Nose normal.  Mouth/Throat: No oropharyngeal exudate.  Eyes: Conjunctivae normal and EOM are normal. Pupils are equal, round, and reactive to light. Right eye exhibits no discharge. Left eye exhibits no discharge. No scleral icterus.  Neck: Normal range of motion. Neck supple. No JVD present. No tracheal deviation present. No thyromegaly present.  Cardiovascular: Normal rate, regular rhythm, normal heart sounds and intact distal pulses.   No murmur heard. Pulmonary/Chest: Effort normal and breath sounds normal. No stridor. No respiratory distress. He has no wheezes. He has no rales. He exhibits no tenderness.       No axillary adenopathy  Abdominal: Soft. Bowel sounds are normal. He exhibits no distension and no mass. There is no tenderness. There is no rebound and no guarding.       No scars. Fixed, nontender palpable mass in the left upper quadrant. I could feel the lateral and I think the medial aspect of this. It does not extend below the umbilicus.  Genitourinary:        No inguinal adenopathy  Musculoskeletal: Normal range of motion. He exhibits no edema and no tenderness.  Lymphadenopathy:    He has no cervical adenopathy.  Neurological: He is alert and oriented to person, place, and time. He has normal reflexes. Coordination normal.  Skin: Skin is warm and dry. No rash noted. He is not diaphoretic. No erythema. No pallor.  Psychiatric: He has a normal mood and affect. His behavior is normal. Judgment and thought content normal.    Data Reviewed CT scan. Cytopathology. Dr. Kalman Drape notes.  Assessment    Left upper quadrant abdominal mass, mesenteric, suspect lymphoma  Mild obesity    Plan    Scheduled for laparoscopic biopsy of his abdominal mass, possible conversion to open laparotomy.  I explained the patient and his wife that we would have to be very careful about how much tissue we took to avoid injury to the intestinal tract into the mesenteric vessels. He knows this may require a laparotomy incision for safety. They're comfortable with this plan  I discussed the indications, details, techniques, and numerous risks of the surgery with him. All their questions were answered. They understand these issues. They agree with this plan.  We will schedule this at the first opportunity       [place]. Derrell Lolling, M.D., [facility] General and Minimally invasive Surgery Breast and Colorectal Surgery Office:   [phone] Pager:   [phone]  07/30/2012, 6:02 PM

## 2012-08-01 ENCOUNTER — Telehealth: Payer: Self-pay | Admitting: Oncology

## 2012-08-01 ENCOUNTER — Other Ambulatory Visit: Payer: Self-pay | Admitting: *Deleted

## 2012-08-01 ENCOUNTER — Telehealth: Payer: Self-pay | Admitting: *Deleted

## 2012-08-01 NOTE — Telephone Encounter (Signed)
Pt's wife called and wants to r/s appt to January, biopsy has been postponed to January 6th, appt has been moved to first opening 09/06/12, nurse notified of biopsy and appt for 09/06/12

## 2012-08-01 NOTE — Telephone Encounter (Signed)
Received message from pt's wife with concerns regarding appt 09/07/11/  Called and spoke with pt's wife; informed per Dr. Truett Perna can see pt 08/23/11 at 11am.  Pt's wife verbalized understanding and expressed appreciation for call back.

## 2012-08-02 ENCOUNTER — Telehealth: Payer: Self-pay | Admitting: Oncology

## 2012-08-02 NOTE — Telephone Encounter (Signed)
LMONVM ADVIISNG THE PT OF HIS R/S JAN APPT FROM 09/06/2012 TO 08/22/2012@11 :00AM

## 2012-08-06 ENCOUNTER — Encounter (HOSPITAL_COMMUNITY): Payer: Self-pay | Admitting: Pharmacist

## 2012-08-10 ENCOUNTER — Ambulatory Visit: Payer: BC Managed Care – PPO | Admitting: Oncology

## 2012-08-14 NOTE — Pre-Procedure Instructions (Signed)
20 Adam Alvarado  08/14/2012   Your procedure is scheduled on:  Monday, January 6th.  Report to [facility] at 6:55 AM.  Call this number if you have problems the morning of surgery: [phone]   Remember: Nothing to eat or drink after Midnight.     Take these medicines the morning of surgery with A SIP OF WATER: -    Do not wear jewelry, make-up or nail polish.  Do not wear lotions, powders, or perfumes. You may wear deodorant.             Men may shave face and neck.  Do not bring valuables to the hospital.  Contacts, dentures or bridgework may not be worn into surgery.  Leave suitcase in the car. After surgery it may be brought to your room.  For patients admitted to the hospital, checkout time is 11:00 AM the day of discharge.   Patients discharged the day of surgery will not be allowed to drive home.  Name and phone number of your driver: --   Special Instructions: Shower using CHG 2 nights before surgery and the night before surgery.  If you shower the day of surgery use CHG.  Use special wash - you have one bottle of CHG for all showers.  You should use approximately 1/3 of the bottle for each shower.   Please read over the following fact sheets that you were given: Pain Booklet, Coughing and Deep Breathing and Surgical Site Infection Prevention

## 2012-08-16 ENCOUNTER — Encounter (HOSPITAL_COMMUNITY)
Admission: RE | Admit: 2012-08-16 | Discharge: 2012-08-16 | Disposition: A | Discharge status: Home / Self Care | Payer: BC Managed Care – PPO | Source: Ambulatory Visit | Attending: General Surgery | Admitting: General Surgery

## 2012-08-16 ENCOUNTER — Encounter (HOSPITAL_COMMUNITY): Payer: Self-pay

## 2012-08-16 HISTORY — DX: Unspecified hemorrhoids: K64.9

## 2012-08-16 HISTORY — DX: Personal history of other diseases of the digestive system: Z87.19

## 2012-08-16 LAB — COMPREHENSIVE METABOLIC PANEL
Albumin: 3.9 g/dL (ref 3.5–5.2)
Alkaline Phosphatase: 85 U/L (ref 39–117)
BUN: 14 mg/dL (ref 6–23)
Creatinine, Ser: 1.18 mg/dL (ref 0.50–1.35)
GFR calc Af Amer: 79 mL/min — ABNORMAL LOW (ref >90)
Glucose, Bld: 108 mg/dL — ABNORMAL HIGH (ref 70–99)
Total Protein: 7 g/dL (ref 6.0–8.3)

## 2012-08-16 LAB — CBC WITH DIFFERENTIAL/PLATELET
Basophils Relative: 0 % (ref 0–1)
Eosinophils Absolute: 0.1 10*3/uL (ref 0.0–0.7)
Eosinophils Relative: 2 % (ref 0–5)
HCT: 46.5 % (ref 39.0–52.0)
Hemoglobin: 15.9 g/dL (ref 13.0–17.0)
Lymphs Abs: 0.6 10*3/uL — ABNORMAL LOW (ref 0.7–4.0)
MCH: 29.2 pg (ref 26.0–34.0)
MCHC: 34.2 g/dL (ref 30.0–36.0)
MCV: 85.5 fL (ref 78.0–100.0)
Monocytes Absolute: 0.7 10*3/uL (ref 0.1–1.0)
Monocytes Relative: 11 % (ref 3–12)
RBC: 5.44 MIL/uL (ref 4.22–5.81)

## 2012-08-16 LAB — PROTIME-INR
INR: 1.02 (ref 0.00–1.49)
Prothrombin Time: 13.3 seconds (ref 11.6–15.2)

## 2012-08-16 LAB — SURGICAL PCR SCREEN: Staphylococcus aureus: POSITIVE — AB

## 2012-08-17 NOTE — H&P (Signed)
Adam Alvarado     MRN: [id number]   Description: 57 year old male  Provider: Ernestene Mention, MD  Department: [facility]        Diagnoses     Abdominal mass, LUQ (left upper quadrant)   - Primary    789.32         Vitals - Last Recorded    BP Pulse Temp Resp Ht Wt    148/86 71 98.2 F (36.8 C) (Temporal) 16 5\' 10"  (1.778 m) 226 lb (102.513 kg)   BMI - 32.43 kg/m2                History and Physical    Ernestene Mention, MD   Patient ID: Adam Alvarado, male   DOB: 1955/11/10, 57 y.o.   MRN: [id number]           HPI Adam Alvarado is a 57 y.o. male.  He is referred by Dr. Mancel Bale for evaluation of abdominal mass, presumed lymphoma, for laparoscopic versus open biopsy. His primary care physician is Dr. [name].   The patient is asymptomatic. No weight loss. No night sweats. No bowel pain or change in his GI habits. Dr. Ouida Sills felt something in his upper abdomen. This led to an ultrasound and a CT scan. CT scan shows a 12 x 12 x 20 cm mass in the upper abdomen intimately associated with the jejunal mesentery and, this abuts the aorta and ligament of Treitz. In the upper outer area of this on the left side there does not appear to be as many vessels. A needle biopsy was not diagnosed diagnostic. They're pretty sure this is some type of lymphoma but could not give a type. Dr. Truett Perna needs more tissue to subategorize the lymphoma and direct therapy   The patient is otherwise pretty healthy healthy other than some obesity. He lives in [place].       Past Medical History   Diagnosis  Date   .  Cancer  2013       Non-Hodgkins Lymphoma   .  Other malignant lymphomas, unspecified site, extranodal and solid organ sites  2013       Past Surgical History   Procedure  Date   .  Vasectomy     .  Vasectomy  1998       Family History   Problem  Relation  Age of Onset   .  Cancer  Mother         Breast      Social History History     Substance Use Topics   .  Smoking status:  Never Smoker    .  Smokeless tobacco:  Never Used   .  Alcohol Use:  Yes         Comment: on occassion      No Known Allergies    No current outpatient prescriptions on file.      Review of Systems   Constitutional: Negative for fever, chills and unexpected weight change.  HENT: Negative for hearing loss, congestion, sore throat, trouble swallowing and voice change.   Eyes: Negative for visual disturbance.  Respiratory: Negative for cough and wheezing.   Cardiovascular: Negative for chest pain, palpitations and leg swelling.  Gastrointestinal: Negative for nausea, vomiting, abdominal pain, diarrhea, constipation, blood in stool, abdominal distention, anal bleeding and rectal pain.  Genitourinary: Negative for hematuria and difficulty urinating.  Musculoskeletal: Negative for arthralgias.  Skin: Negative for  rash and wound.  Neurological: Negative for seizures, syncope, weakness and headaches.  Hematological: Negative for adenopathy. Does not bruise/bleed easily.  Psychiatric/Behavioral: Negative for confusion.    Blood pressure 148/86, pulse 71, temperature 98.2 F (36.8 C), temperature source Temporal, resp. rate 16, height 5\' 10"  (1.778 m), weight 226 lb (102.513 kg).   Physical Exam   Constitutional: He is oriented to person, place, and time. He appears well-developed and well-nourished. No distress.  HENT:   Head: Normocephalic.   Nose: Nose normal.   Mouth/Throat: No oropharyngeal exudate.  Eyes: Conjunctivae normal and EOM are normal. Pupils are equal, round, and reactive to light. Right eye exhibits no discharge. Left eye exhibits no discharge. No scleral icterus.  Neck: Normal range of motion. Neck supple. No JVD present. No tracheal deviation present. No thyromegaly present.  Cardiovascular: Normal rate, regular rhythm, normal heart sounds and intact distal pulses.    No murmur heard. Pulmonary/Chest: Effort normal and  breath sounds normal. No stridor. No respiratory distress. He has no wheezes. He has no rales. He exhibits no tenderness.       No axillary adenopathy  Abdominal: Soft. Bowel sounds are normal. He exhibits no distension and no mass. There is no tenderness. There is no rebound and no guarding.       No scars. Fixed, nontender palpable mass in the left upper quadrant. I could feel the lateral and I think the medial aspect of this. It does not extend below the umbilicus.  Genitourinary:       No inguinal adenopathy  Musculoskeletal: Normal range of motion. He exhibits no edema and no tenderness.  Lymphadenopathy:    He has no cervical adenopathy.  Neurological: He is alert and oriented to person, place, and time. He has normal reflexes. Coordination normal.  Skin: Skin is warm and dry. No rash noted. He is not diaphoretic. No erythema. No pallor.  Psychiatric: He has a normal mood and affect. His behavior is normal. Judgment and thought content normal.    Data Reviewed CT scan. Cytopathology. Dr. Kalman Drape notes.   Assessment Left upper quadrant abdominal mass, mesenteric, suspect lymphoma   Mild obesity   Plan Scheduled for laparoscopic biopsy of his abdominal mass, possible conversion to open laparotomy.   I explained the patient and his wife that we would have to be very careful about how much tissue we took to avoid injury to the intestinal tract into the mesenteric vessels. He knows this may require a laparotomy incision for safety. They're comfortable with this plan   I discussed the indications, details, techniques, and numerous risks of the surgery with him. All their questions were answered. They understand these issues. They agree with this plan.   We will schedule this at the first opportunity       [place]. Derrell Lolling, M.D., [facility] General and Minimally invasive Surgery Breast and Colorectal Surgery Office:   [phone] Pager:    [phone]

## 2012-08-19 MED ORDER — CEFAZOLIN SODIUM-DEXTROSE 2-3 GM-% IV SOLR
2.0000 g | INTRAVENOUS | Status: AC
  Administered 2012-08-20: 2 g via INTRAVENOUS
  Filled 2012-08-19: qty 50

## 2012-08-20 ENCOUNTER — Encounter (HOSPITAL_COMMUNITY)
Admission: RE | Disposition: A | Discharge status: Home / Self Care | Payer: Self-pay | Source: Ambulatory Visit | Attending: General Surgery

## 2012-08-20 ENCOUNTER — Encounter (HOSPITAL_COMMUNITY): Payer: Self-pay | Admitting: Anesthesiology

## 2012-08-20 ENCOUNTER — Observation Stay (HOSPITAL_COMMUNITY)
Admission: RE | Admit: 2012-08-20 | Discharge: 2012-08-21 | Disposition: A | Discharge status: Home / Self Care | Payer: BC Managed Care – PPO | Source: Ambulatory Visit | Attending: General Surgery | Admitting: General Surgery

## 2012-08-20 ENCOUNTER — Ambulatory Visit (HOSPITAL_COMMUNITY): Payer: BC Managed Care – PPO | Admitting: Anesthesiology

## 2012-08-20 DIAGNOSIS — Z01812 Encounter for preprocedural laboratory examination: Secondary | ICD-10-CM | POA: Insufficient documentation

## 2012-08-20 DIAGNOSIS — C8583 Other specified types of non-Hodgkin lymphoma, intra-abdominal lymph nodes: Secondary | ICD-10-CM

## 2012-08-20 DIAGNOSIS — R1902 Left upper quadrant abdominal swelling, mass and lump: Secondary | ICD-10-CM | POA: Diagnosis present

## 2012-08-20 HISTORY — PX: BIOPSY: SHX5522

## 2012-08-20 HISTORY — PX: LAPAROSCOPY: SHX197

## 2012-08-20 SURGERY — LAPAROSCOPY, DIAGNOSTIC
Anesthesia: General | Site: Abdomen | Wound class: Clean

## 2012-08-20 MED ORDER — MIDAZOLAM HCL 5 MG/5ML IJ SOLN
INTRAMUSCULAR | Status: DC | PRN
  Administered 2012-08-20: 2 mg via INTRAVENOUS

## 2012-08-20 MED ORDER — DEXAMETHASONE SODIUM PHOSPHATE 4 MG/ML IJ SOLN
INTRAMUSCULAR | Status: DC | PRN
  Administered 2012-08-20: 8 mg via INTRAVENOUS

## 2012-08-20 MED ORDER — NEOSTIGMINE METHYLSULFATE 1 MG/ML IJ SOLN
INTRAMUSCULAR | Status: DC | PRN
  Administered 2012-08-20: 5 mg via INTRAVENOUS

## 2012-08-20 MED ORDER — MORPHINE SULFATE 2 MG/ML IJ SOLN: 2.0000 mg | INTRAMUSCULAR | Status: DC | PRN

## 2012-08-20 MED ORDER — PROMETHAZINE HCL 25 MG/ML IJ SOLN: 6.2500 mg | INTRAMUSCULAR | Status: DC | PRN

## 2012-08-20 MED ORDER — HYDROMORPHONE HCL PF 1 MG/ML IJ SOLN
INTRAMUSCULAR | Status: AC
  Filled 2012-08-20: qty 1

## 2012-08-20 MED ORDER — ONDANSETRON HCL 4 MG/2ML IJ SOLN
INTRAMUSCULAR | Status: DC | PRN
  Administered 2012-08-20: 4 mg via INTRAVENOUS

## 2012-08-20 MED ORDER — 0.9 % SODIUM CHLORIDE (POUR BTL) OPTIME
TOPICAL | Status: DC | PRN
  Administered 2012-08-20 (×2): 1000 mL

## 2012-08-20 MED ORDER — CHLORHEXIDINE GLUCONATE 4 % EX LIQD: 1.0000 "application " | Freq: Once | CUTANEOUS | Status: DC

## 2012-08-20 MED ORDER — LIDOCAINE HCL (CARDIAC) 20 MG/ML IV SOLN
INTRAVENOUS | Status: DC | PRN
  Administered 2012-08-20: 100 mg via INTRAVENOUS

## 2012-08-20 MED ORDER — HYDROCODONE-ACETAMINOPHEN 10-325 MG PO TABS: 2.0000 | ORAL_TABLET | ORAL | Status: DC | PRN

## 2012-08-20 MED ORDER — PROPOFOL 10 MG/ML IV BOLUS
INTRAVENOUS | Status: DC | PRN
  Administered 2012-08-20: 200 mg via INTRAVENOUS

## 2012-08-20 MED ORDER — BUPIVACAINE-EPINEPHRINE PF 0.25-1:200000 % IJ SOLN
INTRAMUSCULAR | Status: DC | PRN
  Administered 2012-08-20: 9 mL

## 2012-08-20 MED ORDER — OXYCODONE HCL 5 MG PO TABS: 5.0000 mg | ORAL_TABLET | Freq: Once | ORAL | Status: DC | PRN

## 2012-08-20 MED ORDER — SODIUM CHLORIDE 0.9 % IR SOLN
Status: DC | PRN
  Administered 2012-08-20: 1000 mL

## 2012-08-20 MED ORDER — ROCURONIUM BROMIDE 100 MG/10ML IV SOLN
INTRAVENOUS | Status: DC | PRN
  Administered 2012-08-20: 50 mg via INTRAVENOUS

## 2012-08-20 MED ORDER — ONDANSETRON HCL 4 MG PO TABS: 4.0000 mg | ORAL_TABLET | Freq: Four times a day (QID) | ORAL | Status: DC | PRN

## 2012-08-20 MED ORDER — OXYCODONE HCL 5 MG/5ML PO SOLN: 5.0000 mg | Freq: Once | ORAL | Status: DC | PRN

## 2012-08-20 MED ORDER — ONDANSETRON HCL 4 MG/2ML IJ SOLN: 4.0000 mg | Freq: Four times a day (QID) | INTRAMUSCULAR | Status: DC | PRN

## 2012-08-20 MED ORDER — HEMOSTATIC AGENTS (NO CHARGE) OPTIME
TOPICAL | Status: DC | PRN
  Administered 2012-08-20: 1 via TOPICAL

## 2012-08-20 MED ORDER — GLYCOPYRROLATE 0.2 MG/ML IJ SOLN
INTRAMUSCULAR | Status: DC | PRN
  Administered 2012-08-20: .8 mg via INTRAVENOUS

## 2012-08-20 MED ORDER — HYDROMORPHONE HCL PF 1 MG/ML IJ SOLN
0.2500 mg | INTRAMUSCULAR | Status: DC | PRN
  Administered 2012-08-20 (×2): 0.5 mg via INTRAVENOUS

## 2012-08-20 MED ORDER — FENTANYL CITRATE 0.05 MG/ML IJ SOLN
INTRAMUSCULAR | Status: DC | PRN
  Administered 2012-08-20: 50 ug via INTRAVENOUS
  Administered 2012-08-20: 100 ug via INTRAVENOUS
  Administered 2012-08-20 (×2): 50 ug via INTRAVENOUS

## 2012-08-20 MED ORDER — HEPARIN SODIUM (PORCINE) 5000 UNIT/ML IJ SOLN
5000.0000 [IU] | Freq: Three times a day (TID) | INTRAMUSCULAR | Status: DC
  Filled 2012-08-20 (×4): qty 1

## 2012-08-20 MED ORDER — BUPIVACAINE-EPINEPHRINE PF 0.25-1:200000 % IJ SOLN
INTRAMUSCULAR | Status: AC
  Filled 2012-08-20: qty 30

## 2012-08-20 MED ORDER — LACTATED RINGERS IV SOLN
INTRAVENOUS | Status: DC | PRN
  Administered 2012-08-20 (×2): via INTRAVENOUS

## 2012-08-20 MED ORDER — POTASSIUM CHLORIDE IN NACL 20-0.9 MEQ/L-% IV SOLN
INTRAVENOUS | Status: DC
  Administered 2012-08-20 (×2): via INTRAVENOUS
  Filled 2012-08-20 (×4): qty 1000

## 2012-08-20 SURGICAL SUPPLY — 60 items
ADH SKN CLS LQ APL DERMABOND (GAUZE/BANDAGES/DRESSINGS) ×2
BAG SPEC RTRVL LRG 6X4 10 (ENDOMECHANICALS) ×2
BLADE SURG ROTATE 9660 (MISCELLANEOUS) ×2 IMPLANT
CANISTER SUCTION 2500CC (MISCELLANEOUS) ×3 IMPLANT
CHLORAPREP W/TINT 26ML (MISCELLANEOUS) ×3 IMPLANT
CLOTH BEACON ORANGE TIMEOUT ST (SAFETY) ×3 IMPLANT
CONT SPEC 4OZ CLIKSEAL STRL BL (MISCELLANEOUS) ×2 IMPLANT
COVER MAYO STAND STRL (DRAPES) IMPLANT
COVER SURGICAL LIGHT HANDLE (MISCELLANEOUS) ×3 IMPLANT
DERMABOND ADHESIVE PROPEN (GAUZE/BANDAGES/DRESSINGS) ×1
DERMABOND ADVANCED .7 DNX6 (GAUZE/BANDAGES/DRESSINGS) ×1 IMPLANT
DRAPE LAPAROSCOPIC ABDOMINAL (DRAPES) ×3 IMPLANT
DRAPE UTILITY 15X26 W/TAPE STR (DRAPE) ×6 IMPLANT
DRAPE WARM FLUID 44X44 (DRAPE) ×3 IMPLANT
ELECT BLADE 6.5 EXT (BLADE) IMPLANT
ELECT CAUTERY BLADE 6.4 (BLADE) ×3 IMPLANT
ELECT REM PT RETURN 9FT ADLT (ELECTROSURGICAL) ×3
ELECTRODE REM PT RTRN 9FT ADLT (ELECTROSURGICAL) ×2 IMPLANT
GLOVE BIOGEL PI IND STRL 6.5 (GLOVE) ×1 IMPLANT
GLOVE BIOGEL PI IND STRL 7.0 (GLOVE) ×1 IMPLANT
GLOVE BIOGEL PI INDICATOR 6.5 (GLOVE) ×1
GLOVE BIOGEL PI INDICATOR 7.0 (GLOVE) ×1
GLOVE EUDERMIC 7 POWDERFREE (GLOVE) ×3 IMPLANT
GLOVE SS BIOGEL STRL SZ 6.5 (GLOVE) ×1 IMPLANT
GLOVE SUPERSENSE BIOGEL SZ 6.5 (GLOVE) ×1
GLOVE SURG SIGNA 7.5 PF LTX (GLOVE) ×2 IMPLANT
GLOVE SURG SS PI 6.5 STRL IVOR (GLOVE) ×2 IMPLANT
GLOVE SURG SS PI 7.0 STRL IVOR (GLOVE) ×2 IMPLANT
GOWN PREVENTION PLUS XLARGE (GOWN DISPOSABLE) ×5 IMPLANT
GOWN STRL NON-REIN LRG LVL3 (GOWN DISPOSABLE) ×6 IMPLANT
HEMOSTAT SNOW SURGICEL 2X4 (HEMOSTASIS) ×2 IMPLANT
KIT BASIN OR (CUSTOM PROCEDURE TRAY) ×3 IMPLANT
KIT ROOM TURNOVER OR (KITS) ×3 IMPLANT
LIGASURE IMPACT 36 18CM CVD LR (INSTRUMENTS) IMPLANT
NS IRRIG 1000ML POUR BTL (IV SOLUTION) ×6 IMPLANT
PACK GENERAL/GYN (CUSTOM PROCEDURE TRAY) ×3 IMPLANT
PAD ARMBOARD 7.5X6 YLW CONV (MISCELLANEOUS) ×3 IMPLANT
PAD SHARPS MAGNETIC DISPOSAL (MISCELLANEOUS) IMPLANT
POUCH SPECIMEN RETRIEVAL 10MM (ENDOMECHANICALS) ×2 IMPLANT
SCALPEL HARMONIC ACE (MISCELLANEOUS) ×2 IMPLANT
SET IRRIG TUBING LAPAROSCOPIC (IRRIGATION / IRRIGATOR) ×2 IMPLANT
SLEEVE ENDOPATH XCEL 5M (ENDOMECHANICALS) ×2 IMPLANT
SPECIMEN JAR X LARGE (MISCELLANEOUS) IMPLANT
SPONGE LAP 18X18 X RAY DECT (DISPOSABLE) IMPLANT
STAPLER VISISTAT 35W (STAPLE) ×3 IMPLANT
SUCTION POOLE TIP (SUCTIONS) ×1 IMPLANT
SUT MNCRL AB 4-0 PS2 18 (SUTURE) ×2 IMPLANT
SUT PDS AB 1 TP1 96 (SUTURE) ×2 IMPLANT
SUT SILK 2 0 SH CR/8 (SUTURE) ×1 IMPLANT
SUT SILK 2 0 TIES 10X30 (SUTURE) ×1 IMPLANT
SUT SILK 3 0 SH CR/8 (SUTURE) ×1 IMPLANT
SUT SILK 3 0 TIES 10X30 (SUTURE) ×1 IMPLANT
SYR BULB IRRIGATION 50ML (SYRINGE) IMPLANT
TOWEL OR 17X26 10 PK STRL BLUE (TOWEL DISPOSABLE) ×3 IMPLANT
TRAY FOLEY CATH 14FRSI W/METER (CATHETERS) ×2 IMPLANT
TRAY LAPAROSCOPIC (CUSTOM PROCEDURE TRAY) ×2 IMPLANT
TROCAR XCEL BLUNT TIP 100MML (ENDOMECHANICALS) ×2 IMPLANT
TROCAR XCEL NON-BLD 5MMX100MML (ENDOMECHANICALS) ×2 IMPLANT
WATER STERILE IRR 1000ML POUR (IV SOLUTION) IMPLANT
YANKAUER SUCT BULB TIP NO VENT (SUCTIONS) IMPLANT

## 2012-08-20 NOTE — Anesthesia Preprocedure Evaluation (Addendum)
Anesthesia Evaluation  Patient identified by MRN, date of birth, ID band Patient awake    Reviewed: Allergy & Precautions, H&P , NPO status , Patient's Chart, lab work & pertinent test results  History of Anesthesia Complications Negative for: history of anesthetic complications  Airway Mallampati: II TM Distance: >3 FB   Mouth opening: Limited Mouth Opening  Dental  (+) Teeth Intact and Dental Advisory Given   Pulmonary neg pulmonary ROS,    Pulmonary exam normal       Cardiovascular negative cardio ROS      Neuro/Psych negative neurological ROS  negative psych ROS   GI/Hepatic Neg liver ROS, hiatal hernia,   Endo/Other  negative endocrine ROS  Renal/GU negative Renal ROS     Musculoskeletal negative musculoskeletal ROS (+)   Abdominal   Peds  Hematology negative hematology ROS (+)   Anesthesia Other Findings   Reproductive/Obstetrics negative OB ROS                          Anesthesia Physical Anesthesia Plan  ASA: III  Anesthesia Plan: General   Post-op Pain Management:    Induction: Intravenous  Airway Management Planned: Oral ETT  Additional Equipment:   Intra-op Plan:   Post-operative Plan: Extubation in OR  Informed Consent: I have reviewed the patients History and Physical, chart, labs and discussed the procedure including the risks, benefits and alternatives for the proposed anesthesia with the patient or authorized representative who has indicated his/her understanding and acceptance.   Dental advisory given  Plan Discussed with: CRNA, Anesthesiologist and Surgeon  Anesthesia Plan Comments:         Anesthesia Quick Evaluation

## 2012-08-20 NOTE — Op Note (Signed)
Patient Name:           Adam Alvarado   Date of Surgery:        08/20/2012  Pre op Diagnosis:      Abdominal mesenteric mass, suspect lymphoma  Post op Diagnosis:    Same  Procedure:                 Diagnostic laparoscopy, laparoscopic biopsy of abdominal mesenteric mass  Surgeon:                     Angelia Mould. Derrell Lolling, M.D., FACS  Assistant:                      Ovidio Kin, M.D., FACS  Operative Indications:   Adam Alvarado is a 57 y.o. male. He is referred by Dr. Mancel Alvarado for evaluation of abdominal mass, presumed lymphoma, for laparoscopic versus open biopsy. His primary care physician is Dr. Carylon Alvarado.  The patient is asymptomatic. No weight loss. No night sweats. No abdominal pain or change in his GI habits. Dr. Ouida Alvarado felt something in his upper abdomen. This led to an ultrasound and a CT scan. CT scan shows a 12 x 12 x 20 cm mass in the upper abdomen intimately associated with the jejunal mesentery and  this abuts the aorta and ligament of Treitz. In the upper outer area of this on the left side there does not appear to be as many vessels. A needle biopsy was not diagnostic. They're pretty sure this is some type of lymphoma but could not give a type. Dr. Truett Alvarado needs more tissue to subategorize the lymphoma and direct therapy  The patient is otherwise pretty healthy healthy other than some mild obesity. He lives in [place].   Operative Findings:       There was a large pinkish propyl lobulated mass in the left abdomen. The transverse colon and stomach could be seen above  and the  small bowel could be seen below this. This was multinodular, allowing for a laparoscopic biopsy. The case was discussed with Dr. Colonel Bald in pathology who stated that they had plenty of tissue.  Procedure in Detail:          Following the induction of general endotracheal anesthesia a Foley catheter was placed, the abdomen was prepped and draped in a sterile fashion, intravenous antibiotics were  given, and a surgical time out was performed. 0.5% Marcaine with epinephrine was used as a local infiltration anesthetic. A vertical incision was made at the lower rim of the umbilicus. The fascia was incised in the midline and the abdominal cavity entered under direct vision. An 11 mm Hassan trocar was inserted and secured with purse string suture of 0 Vicryl. Pneumoperitoneum was created. Visualization was performed with findings as described above. I placed tw 5 mm trocars in the right paramedian abdomen. A placed a 5 mm trocar camera in the most upper trocar. After surveying the anatomy of the small bowel and large bowel and the tumor I excised probably 5  g of tissue from the surface of the tumor mass. I scored this with the cautery and then resected it using the Harmonic Scalpel. Hemostasis was excellent. I placed some Snow hemostatic sponge upon this and there was no bleeding. I sent the specimen fresh to the lab and discussed it with Dr. Colonel Bald, who stated we had plenty of tissue for lymphoma workup. At this point we felt nothing  further needed to be done.We irrigated little bit and evacuated irrigation fluid. The trocars were removed and there was no bleeding from the trocar sites. The pneumoperitoneum was released. The fascia at the umbilicus was closed with 0 Vicryl sutures and the skin incisions closed with subcuticular sutures of 4-0 Monocryl and Dermabond. The patient tolerated the procedure well and was taken to recovery room stable condition. EBL 15 cc. Counts correct. Consultations none.     Angelia Mould. Derrell Lolling, M.D., FACS General and Minimally Invasive Surgery Breast and Colorectal Surgery  08/20/2012 10:30 AM

## 2012-08-20 NOTE — Interval H&P Note (Signed)
History and Physical Interval Note:  08/20/2012 8:59 AM  Adam Alvarado  has presented today for surgery, with the diagnosis of abdominal mass, suspect lymphoma  The goals and the  various methods of treatment have been discussed with the patient and family. After consideration of risks, benefits and other options for treatment, the patient has consented to  Procedure(s) (LRB) with comments: EXPLORATORY LAPAROTOMY (N/A) - Laparoscopic biopsy of abdominal mass, possible exploratory laparotomy as a surgical intervention .  The patient's history has been reviewed, patient examined today, no change in status, stable for surgery.  I have reviewed the patient's chart and labs.  Questions were answered to the patient's satisfaction.     Ernestene Mention

## 2012-08-20 NOTE — Preoperative (Signed)
Beta Blockers   Reason not to administer Beta Blockers:Not Applicable 

## 2012-08-20 NOTE — Anesthesia Postprocedure Evaluation (Signed)
Anesthesia Post Note  Patient: Adam Alvarado  Procedure(s) Performed: Procedure(s) (LRB): LAPAROSCOPY DIAGNOSTIC (N/A) BIOPSY (N/A)  Anesthesia type: general  Patient location: PACU  Post pain: Pain level controlled  Post assessment: Patient's Cardiovascular Status Stable  Last Vitals:  Filed Vitals:   08/20/12 1030  BP: 171/85  Pulse: 66  Temp: 36.7 C  Resp: 17    Post vital signs: Reviewed and stable  Level of consciousness: sedated  Complications: No apparent anesthesia complications

## 2012-08-20 NOTE — Transfer of Care (Signed)
Immediate Anesthesia Transfer of Care Note  Patient: Adam Alvarado  Procedure(s) Performed: Procedure(s) (LRB) with comments: LAPAROSCOPY DIAGNOSTIC (N/A) BIOPSY (N/A) - Laparoscopic biopsy of abdominal mesenteric mass  Patient Location: PACU  Anesthesia Type:General  Level of Consciousness: awake, alert  and oriented  Airway & Oxygen Therapy: Patient Spontanous Breathing and Patient connected to nasal cannula oxygen  Post-op Assessment: Report given to PACU RN and Post -op Vital signs reviewed and stable  Post vital signs: Reviewed and stable  Complications: No apparent anesthesia complications

## 2012-08-21 ENCOUNTER — Encounter (HOSPITAL_COMMUNITY): Payer: Self-pay | Admitting: *Deleted

## 2012-08-21 MED ORDER — HYDROCODONE-ACETAMINOPHEN 5-325 MG PO TABS: 1.0000 | ORAL_TABLET | ORAL | Status: DC | PRN

## 2012-08-21 NOTE — Discharge Summary (Signed)
  Patient ID: Adam Alvarado [id number] 56 y.o. September 01, 1955  08/20/2012  Discharge date and time: August 21, 2012  Admitting Physician: Ernestene Mention  Discharge Physician: Ernestene Mention  Admission Diagnoses: abdominal mass, suspect lymphoma  Discharge Diagnoses: same  Operations: Procedure(s): LAPAROSCOPY DIAGNOSTIC BIOPSY  Admission Condition: good  Discharged Condition: good  Indication for Admission: [name] is a 57 y.o. male. He is referred by Dr. Mancel Bale for evaluation of abdominal mass, presumed lymphoma, for laparoscopic versus open biopsy. His primary care physician is Dr. [name].  The patient is asymptomatic. No weight loss. No night sweats. No bowel pain or change in his GI habits. Dr. Ouida Sills felt something in his upper abdomen. This led to an ultrasound and a CT scan. CT scan shows a 12 x 12 x 20 cm mass in the upper abdomen intimately associated with the jejunal mesentery and, this abuts the aorta and ligament of Treitz. In the upper outer area of this on the left side there does not appear to be as many vessels. A needle biopsy was not diagnosed diagnostic. They're pretty sure this is some type of lymphoma but could not give a type. Dr. Truett Perna needs more tissue to subategorize the lymphoma and direct therapy.On examination you can feel a fullness in the left mid abdomen. There is no peripheral adenopathy.   Hospital Course: On the day of admission the patient was taken to the operating room and underwent diagnostic laparoscopy and laparoscopic biopsy of his abdominal mass. Findings were a multilobulated purplish- pink mass presenting below the transverse colon in the left mid abdomen. We were able to laparoscopically excised 4-5 g of tissue which appeared to be adequate. Hemostasis was good. He was observed overnight and did well, resuming diet and activities with minimal pain and no alteration in his vital signs. On the day of discharge he looked well.  His abdomen was soft. His wounds looked good. He was given a prescription for Vicodin for pain. Diet and activities were discussed. He was asked to return to see Dr. Derrell Lolling in 3 weeks for a wound check. He has an appointment to see Dr. Truett Perna tomorrow for further discussion.  Consults: None  Significant Diagnostic Studies: pathology (pending)  Treatments: surgery: Laparoscopic biopsy of abdominal mass.  Disposition: Home  Patient Instructions:   Abelino, Tippin  Home Medication Instructions WUJ:[id number]   Printed on:08/21/12 9562  Medication Information                    HYDROcodone-acetaminophen (NORCO/VICODIN) 5-325 MG per tablet Take 1-2 tablets by mouth every 4 (four) hours as needed for pain.             Activity: no heavy lifting for 2 weeks Diet: low fat, low cholesterol diet Wound Care: none needed  Follow-up:  With Dr. Derrell Lolling in 3 weeks.  Signed: Angelia Mould. Derrell Lolling, M.D., FACS General and minimally invasive surgery Breast and Colorectal Surgery  08/21/2012, 7:26 AM

## 2012-08-21 NOTE — Progress Notes (Signed)
1 Day Post-Op  Subjective: Doing well. Tolerating diet. Ambulatory. Voiding well. Pain very well controlled. Ready to go home. Operative findings discussed with patient and wife.  Objective: Vital signs in last 24 hours: Temp:  [97.8 F (36.6 C)-98.4 F (36.9 C)] 98.2 F (36.8 C) (01/07 0552) Pulse Rate:  [57-76] 70  (01/07 0552) Resp:  [17-20] 18  (01/07 0552) BP: (117-171)/(51-85) 117/61 mmHg (01/07 0552) SpO2:  [95 %-100 %] 98 % (01/07 0552) Weight:  [230 lb 2.6 oz (104.4 kg)] 230 lb 2.6 oz (104.4 kg) (01/06 1300) Last BM Date: 08/19/12  Intake/Output from previous day: 01/06 0701 - 01/07 0700 In: 3543.3 [P.O.:360; I.V.:3183.3] Out: 90 [Urine:60; Blood:30] Intake/Output this shift:    General appearance: alert. Mental status normal. Cooperative. No distress. GI: abdomen soft. Wounds looked fine. Not distended. Minimal tenderness. Benign postop exam.  Lab Results:  No results found for this or any previous visit (from the past 24 hour(s)).   Studies/Results: @RISRSLT24 @     . heparin  5,000 Units Subcutaneous Q8H     Assessment/Plan: s/p Procedure(s): LAPAROSCOPY DIAGNOSTIC BIOPSY  Doing well.POD#1. Discharge home today. Prescription for Vicodin given. Diet and  activities discussed Please see Dr. Truett Perna in medical oncology tomorrow Return to see me in 3 weeks.    LOS: 1 day    Zair Borawski M. Derrell Lolling, M.D., [facility] General and Minimally invasive Surgery Breast and Colorectal Surgery Office:   [phone] Pager:   [phone]  08/21/2012  . .prob

## 2012-08-22 ENCOUNTER — Telehealth: Payer: Self-pay | Admitting: Oncology

## 2012-08-22 ENCOUNTER — Ambulatory Visit (HOSPITAL_BASED_OUTPATIENT_CLINIC_OR_DEPARTMENT_OTHER): Payer: BC Managed Care – PPO | Admitting: Oncology

## 2012-08-22 VITALS — BP 139/77 | HR 61 | Temp 98.1°F | Resp 20 | Ht 70.0 in | Wt 231.5 lb

## 2012-08-22 DIAGNOSIS — R1900 Intra-abdominal and pelvic swelling, mass and lump, unspecified site: Secondary | ICD-10-CM

## 2012-08-22 DIAGNOSIS — R1902 Left upper quadrant abdominal swelling, mass and lump: Secondary | ICD-10-CM

## 2012-08-22 NOTE — Telephone Encounter (Signed)
Gave pt appr for 1/14 MD and chemo class

## 2012-08-22 NOTE — Progress Notes (Signed)
   [facility]    OFFICE PROGRESS NOTE   INTERVAL HISTORY:   He returns as scheduled. He underwent a laparoscopic biopsy of the abdominal mass by Dr. Derrell Lolling on 08/21/2011. He reports tolerating the procedure well. He was discharged yesterday.  Adam Alvarado feels well. No new complaint. He has not experienced significant nausea or vomiting over the past several weeks. He has noted mild constipation.  Objective:  Vital signs in last 24 hours:  Blood pressure 139/77, pulse 61, temperature 98.1 F (36.7 C), temperature source Oral, resp. rate 20, height 5\' 10"  (1.778 m), weight 231 lb 8 oz (105.008 kg).   Resp: Lungs clear bilaterally Cardio: Regular rate and rhythm GI: Mild fullness in the left upper abdomen, healing surgical incisions Vascular: No leg edema  Lab Results:  Lab Results  Component Value Date   WBC 6.4 08/16/2012   HGB 15.9 08/16/2012   HCT 46.5 08/16/2012   MCV 85.5 08/16/2012   PLT 215 08/16/2012      Medications: I have reviewed the patient's current medications.  Assessment/Plan: 1.Large Abdominal mass with surrounding small lymph nodes -status post a CT-guided biopsy on 07/20/2012 with the pathology suggestive of non-Hodgkin's lymphoma. Status post a laparoscopic biopsy of the left abdominal mass on 08/21/2011 with the final pathology pending 2. Episodes of vomiting after brushing his tongue,? Related to the abdominal mass    He underwent an incisional biopsy of the left abdominal mass on 08/21/2011. The final pathology is pending. Dr. Laureen Ochs indicates the biopsy is diagnostic of non-Hodgkin's lymphoma. Additional immunohistochemical stains are planned in order to arrive at a specific diagnosis.  Adam Alvarado will return for an office visit and chemotherapy teaching class on 08/29/2011. He will be referred for a staging PET scan and bone marrow biopsy depending on the lymphoma subtype. The plan is to initiate systemic therapy within the next one to 2  weeks.   [name], MD  08/22/2012  1:14 PM

## 2012-08-24 ENCOUNTER — Other Ambulatory Visit: Payer: Self-pay | Admitting: Oncology

## 2012-08-24 ENCOUNTER — Telehealth: Payer: Self-pay | Admitting: *Deleted

## 2012-08-24 DIAGNOSIS — C8589 Other specified types of non-Hodgkin lymphoma, extranodal and solid organ sites: Secondary | ICD-10-CM

## 2012-08-24 NOTE — Telephone Encounter (Signed)
Dr. Truett Perna called pt and discussed pathology report. Pt will need BMBX in radiology. Port a cath placement and a PET ASAP. POF to schedulers.

## 2012-08-27 ENCOUNTER — Other Ambulatory Visit: Payer: Self-pay | Admitting: *Deleted

## 2012-08-27 ENCOUNTER — Encounter: Payer: Self-pay | Admitting: Oncology

## 2012-08-27 ENCOUNTER — Other Ambulatory Visit: Payer: Self-pay | Admitting: Radiology

## 2012-08-27 ENCOUNTER — Telehealth: Payer: Self-pay | Admitting: Oncology

## 2012-08-27 ENCOUNTER — Other Ambulatory Visit: Payer: Self-pay | Admitting: Oncology

## 2012-08-27 DIAGNOSIS — C8590 Non-Hodgkin lymphoma, unspecified, unspecified site: Secondary | ICD-10-CM

## 2012-08-27 NOTE — Progress Notes (Signed)
Will attempt to reschedule chemo class to 08/29/12 and have ECHO also done on this day. Message to MD for MD visit reschedule and to confirm waiting for 09/05/12 for PET scan is OK.

## 2012-08-27 NOTE — Telephone Encounter (Signed)
Per cheryl in [place] pt is having bx tomorrow

## 2012-08-28 ENCOUNTER — Encounter (HOSPITAL_COMMUNITY): Payer: Self-pay | Admitting: Pharmacy Technician

## 2012-08-28 ENCOUNTER — Telehealth (INDEPENDENT_AMBULATORY_CARE_PROVIDER_SITE_OTHER): Payer: Self-pay | Admitting: General Surgery

## 2012-08-28 ENCOUNTER — Telehealth: Payer: Self-pay | Admitting: Oncology

## 2012-08-28 ENCOUNTER — Ambulatory Visit (HOSPITAL_COMMUNITY)
Admission: RE | Admit: 2012-08-28 | Discharge: 2012-08-28 | Disposition: A | Discharge status: Home / Self Care | Payer: BC Managed Care – PPO | Source: Ambulatory Visit | Attending: Oncology | Admitting: Oncology

## 2012-08-28 ENCOUNTER — Ambulatory Visit: Payer: BC Managed Care – PPO | Admitting: Oncology

## 2012-08-28 ENCOUNTER — Encounter (HOSPITAL_COMMUNITY): Payer: Self-pay

## 2012-08-28 ENCOUNTER — Other Ambulatory Visit: Payer: BC Managed Care – PPO

## 2012-08-28 DIAGNOSIS — C8589 Other specified types of non-Hodgkin lymphoma, extranodal and solid organ sites: Secondary | ICD-10-CM | POA: Insufficient documentation

## 2012-08-28 LAB — PROTIME-INR: INR: 1.06 (ref 0.00–1.49)

## 2012-08-28 LAB — CBC
HCT: 43.2 % (ref 39.0–52.0)
Platelets: 238 10*3/uL (ref 150–400)
RBC: 5.08 MIL/uL (ref 4.22–5.81)
RDW: 12.5 % (ref 11.5–15.5)
WBC: 5.6 10*3/uL (ref 4.0–10.5)

## 2012-08-28 LAB — APTT: aPTT: 27 seconds (ref 24–37)

## 2012-08-28 MED ORDER — FENTANYL CITRATE 0.05 MG/ML IJ SOLN
INTRAMUSCULAR | Status: AC
  Filled 2012-08-28: qty 4

## 2012-08-28 MED ORDER — SODIUM CHLORIDE 0.9 % IV SOLN
INTRAVENOUS | Status: DC
  Administered 2012-08-28: 08:00:00 via INTRAVENOUS

## 2012-08-28 MED ORDER — MIDAZOLAM HCL 2 MG/2ML IJ SOLN
INTRAMUSCULAR | Status: AC | PRN
  Administered 2012-08-28 (×2): 2 mg via INTRAVENOUS

## 2012-08-28 MED ORDER — FENTANYL CITRATE 0.05 MG/ML IJ SOLN
INTRAMUSCULAR | Status: AC | PRN
  Administered 2012-08-28: 100 ug via INTRAVENOUS

## 2012-08-28 MED ORDER — MIDAZOLAM HCL 2 MG/2ML IJ SOLN
INTRAMUSCULAR | Status: AC
  Filled 2012-08-28: qty 4

## 2012-08-28 NOTE — Telephone Encounter (Signed)
Called and spoke with patient spouse Adam Alvarado offered appointment to be seen on 09/04/12. However, due to patient other appointments with oncology they preferred a later appointment as they were told they would be seen in 3 weeks post op and not in 2. Offered appointment to be seen on 09/14/12 at 10:30, which worked much better for them.

## 2012-08-28 NOTE — H&P (Signed)
Adam Alvarado is an 57 y.o. male.   Chief Complaint: "I'm here for a bone marrow biopsy" HPI: Patient with history of NHL presents today for CT guided bone marrow biopsy.  Past Medical History  Diagnosis Date  . Cancer 2013    Non-Hodgkins Lymphoma  . Other malignant lymphomas, unspecified site, extranodal and solid organ sites 2013  . Hemorrhoid   . H/O hiatal hernia     small seen on CT Scan    Past Surgical History  Procedure Date  . Vasectomy   . Vasectomy 1998  . No past surgeries   . Laparoscopy 08/20/2012    Procedure: LAPAROSCOPY DIAGNOSTIC;  Surgeon: Ernestene Mention, MD;  Location: [facility] OR;  Service: General;  Laterality: N/A;  . Esophageal biopsy 08/20/2012    Procedure: BIOPSY;  Surgeon: Ernestene Mention, MD;  Location: [facility] OR;  Service: General;  Laterality: N/A;  Laparoscopic biopsy of abdominal mesenteric mass    Family History  Problem Relation Age of Onset  . Cancer Mother     Breast   Social History:  reports that he has never smoked. He has never used smokeless tobacco. He reports that he drinks alcohol. He reports that he does not use illicit drugs.  Allergies: No Known Allergies  Current outpatient prescriptions:HYDROcodone-acetaminophen (NORCO/VICODIN) 5-325 MG per tablet, Take 1-2 tablets by mouth every 4 (four) hours as needed for pain., Disp: 30 tablet, Rfl: 1 Current facility-administered medications:0.9 %  sodium chloride infusion, , Intravenous, Continuous, Brayton El, PA, Last Rate: 20 mL/hr at 08/28/12 0813   Results for orders placed during the hospital encounter of 08/28/12 (from the past 48 hour(s))  CBC     Status: Normal   Collection Time   08/28/12  8:00 AM      Component Value Range Comment   WBC 5.6  4.0 - 10.5 K/uL    RBC 5.08  4.22 - 5.81 MIL/uL    Hemoglobin 14.7  13.0 - 17.0 g/dL    HCT 40.9  81.1 - 91.4 %    MCV 85.0  78.0 - 100.0 fL    MCH 28.9  26.0 - 34.0 pg    MCHC 34.0  30.0 - 36.0 g/dL    RDW 78.2  95.6 - 21.3 %    Platelets 238  150 - 400 K/uL    No results found.  Review of Systems  Constitutional: Negative for fever and chills.  Respiratory: Negative for cough and shortness of breath.   Cardiovascular: Negative for chest pain.  Gastrointestinal: Negative for nausea, vomiting and abdominal pain.  Musculoskeletal: Negative for back pain.  Neurological: Negative for headaches.   Vitals:   BP 118/71  HR  52  R 18  O2 SATS 94% RA  TEMP 98.3 Physical Exam  Constitutional: He is oriented to person, place, and time. He appears well-developed and well-nourished.  Cardiovascular: Normal rate and regular rhythm.   Respiratory: Effort normal and breath sounds normal.  GI: Soft. Bowel sounds are normal.       Clean lap abd wounds covered with dermabond  Musculoskeletal: Normal range of motion. He exhibits no edema.  Neurological: He is alert and oriented to person, place, and time.     Assessment/Plan Patient with NHL. Plan is for CT guided bone marrow biopsy today. Details/risks of procedure d/w pt/wife with their understanding and consent.  Daila Elbert,D KEVIN 08/28/2012, 8:25 AM

## 2012-08-28 NOTE — Telephone Encounter (Signed)
Wife stopped by to r/s chemo ed and dr gbs visit,done and printed.

## 2012-08-28 NOTE — Telephone Encounter (Signed)
Received note from triage nurse yesterday for f/u w/BS 1/17 @ 3:30pm and pt sooner (current pet was on schedule for 1/22). Not able to get pet before 1/17. S/w desk nurse today and per desk nurse he still wants to see pt 1/17 even though pet not until 1/23. appt for 12/23 f/u moved to 1/17. S/w pt today re change and new f/u for 1/17. Also confirmed 1/15 ched, 1/16 port placement and 1/22 pet scan.

## 2012-08-28 NOTE — Procedures (Signed)
Technically successful CT guided bone marrow aspiration and biopsy of left iliac crest. No immediate complications. Awaiting pathology report.    

## 2012-08-29 ENCOUNTER — Other Ambulatory Visit: Payer: BC Managed Care – PPO

## 2012-08-29 ENCOUNTER — Encounter: Payer: Self-pay | Admitting: *Deleted

## 2012-08-29 ENCOUNTER — Other Ambulatory Visit: Payer: Self-pay | Admitting: Radiology

## 2012-08-29 ENCOUNTER — Telehealth: Payer: Self-pay | Admitting: Oncology

## 2012-08-29 NOTE — Telephone Encounter (Signed)
Pt came in today and was given appt schedule for January. Not able to arrange echo at any of the [place] facilities while pt here on 1/17 or 1/22. Per pt request will try [facility]. Pt lives in [place].

## 2012-08-30 ENCOUNTER — Ambulatory Visit (HOSPITAL_COMMUNITY)
Admission: RE | Admit: 2012-08-30 | Discharge: 2012-08-30 | Disposition: A | Discharge status: Home / Self Care | Payer: BC Managed Care – PPO | Source: Ambulatory Visit | Attending: Oncology | Admitting: Oncology

## 2012-08-30 ENCOUNTER — Telehealth: Payer: Self-pay | Admitting: Oncology

## 2012-08-30 ENCOUNTER — Other Ambulatory Visit: Payer: Self-pay | Admitting: Oncology

## 2012-08-30 ENCOUNTER — Encounter (HOSPITAL_COMMUNITY): Payer: Self-pay

## 2012-08-30 ENCOUNTER — Other Ambulatory Visit: Payer: Self-pay | Admitting: Radiology

## 2012-08-30 DIAGNOSIS — C8589 Other specified types of non-Hodgkin lymphoma, extranodal and solid organ sites: Secondary | ICD-10-CM

## 2012-08-30 DIAGNOSIS — C8190 Hodgkin lymphoma, unspecified, unspecified site: Secondary | ICD-10-CM | POA: Insufficient documentation

## 2012-08-30 MED ORDER — MIDAZOLAM HCL 2 MG/2ML IJ SOLN
INTRAMUSCULAR | Status: AC | PRN
  Administered 2012-08-30 (×3): 1 mg via INTRAVENOUS

## 2012-08-30 MED ORDER — FENTANYL CITRATE 0.05 MG/ML IJ SOLN
INTRAMUSCULAR | Status: AC | PRN
  Administered 2012-08-30: 100 ug via INTRAVENOUS
  Administered 2012-08-30: 50 ug via INTRAVENOUS

## 2012-08-30 MED ORDER — FENTANYL CITRATE 0.05 MG/ML IJ SOLN
INTRAMUSCULAR | Status: AC
  Filled 2012-08-30: qty 6

## 2012-08-30 MED ORDER — LIDOCAINE HCL 1 % IJ SOLN
INTRAMUSCULAR | Status: AC
  Filled 2012-08-30: qty 20

## 2012-08-30 MED ORDER — MIDAZOLAM HCL 2 MG/2ML IJ SOLN
INTRAMUSCULAR | Status: AC
  Filled 2012-08-30: qty 6

## 2012-08-30 MED ORDER — CEFAZOLIN SODIUM-DEXTROSE 2-3 GM-% IV SOLR
2.0000 g | Freq: Once | INTRAVENOUS | Status: AC
  Administered 2012-08-30: 2 g via INTRAVENOUS
  Filled 2012-08-30: qty 50

## 2012-08-30 MED ORDER — SODIUM CHLORIDE 0.9 % IV SOLN: INTRAVENOUS | Status: DC

## 2012-08-30 MED ORDER — HEPARIN SOD (PORK) LOCK FLUSH 100 UNIT/ML IV SOLN
INTRAVENOUS | Status: AC | PRN
  Administered 2012-08-30: 500 [IU]

## 2012-08-30 NOTE — H&P (Signed)
Adam Alvarado is an 57 y.o. male.   Chief Complaint: "I'm here for a portacath" HPI: Patient with history of NHL underwent BM biopsy yesterday and returns today for port placement. Feels fine since procedure yesterday. PMHX and meds reviewed as below.  Past Medical History  Diagnosis Date  . Cancer 2013    Non-Hodgkins Lymphoma  . Other malignant lymphomas, unspecified site, extranodal and solid organ sites 2013  . Hemorrhoid   . H/O hiatal hernia     small seen on CT Scan    Past Surgical History  Procedure Date  . Vasectomy   . Vasectomy 1998  . No past surgeries   . Laparoscopy 08/20/2012    Procedure: LAPAROSCOPY DIAGNOSTIC;  Surgeon: Ernestene Mention, MD;  Location: [facility] OR;  Service: General;  Laterality: N/A;  . Esophageal biopsy 08/20/2012    Procedure: BIOPSY;  Surgeon: Ernestene Mention, MD;  Location: [facility] OR;  Service: General;  Laterality: N/A;  Laparoscopic biopsy of abdominal mesenteric mass    Family History  Problem Relation Age of Onset  . Cancer Mother     Breast   Social History:  reports that he has never smoked. He has never used smokeless tobacco. He reports that he drinks alcohol. He reports that he does not use illicit drugs.  Allergies: No Known Allergies  No current outpatient prescriptions on file.  Review of Systems  Constitutional: Negative for fever and chills.  Respiratory: Negative for cough and shortness of breath.   Cardiovascular: Negative for chest pain.  Gastrointestinal: Negative for nausea, vomiting and abdominal pain.  Musculoskeletal: Negative for back pain.  Neurological: Negative for headaches.   BP 127/81  Pulse 53  Temp 98.6 F (37 C) (Oral)  Resp 21  SpO2 95%  Physical Exam  Constitutional: He is oriented to person, place, and time. He appears well-developed and well-nourished.  Cardiovascular: Normal rate and regular rhythm.   Respiratory: Effort normal and breath sounds normal.  Musculoskeletal: Normal range of motion.  He exhibits no edema.  Neurological: He is alert and oriented to person, place, and time.     Assessment/Plan NHL Discussed portacath placement, including risks, complications, care. Labs from prior procedure reviewed, ok. Consent signed in chart  Brayton El 08/30/2012, 10:49 AM

## 2012-08-30 NOTE — H&P (Signed)
Agree with PA note.  RIght IJ approach [employer].  Signed,  Sterling Big, MD Vascular & Interventional Radiologist [facility]

## 2012-08-30 NOTE — Telephone Encounter (Signed)
S/w wife re appt for echo @ Adam Alvarado 1/20 @ 3pm. Pt will register @ Adam Alvarado front desk.

## 2012-08-31 ENCOUNTER — Ambulatory Visit (HOSPITAL_BASED_OUTPATIENT_CLINIC_OR_DEPARTMENT_OTHER): Payer: BC Managed Care – PPO | Admitting: Oncology

## 2012-08-31 ENCOUNTER — Telehealth: Payer: Self-pay | Admitting: Oncology

## 2012-08-31 ENCOUNTER — Ambulatory Visit (HOSPITAL_COMMUNITY)
Admission: RE | Admit: 2012-08-31 | Discharge: 2012-08-31 | Disposition: A | Discharge status: Home / Self Care | Payer: BC Managed Care – PPO | Source: Ambulatory Visit | Attending: Oncology | Admitting: Oncology

## 2012-08-31 VITALS — BP 136/66 | HR 69 | Temp 97.9°F | Resp 20 | Ht 70.0 in | Wt 226.7 lb

## 2012-08-31 DIAGNOSIS — C8590 Non-Hodgkin lymphoma, unspecified, unspecified site: Secondary | ICD-10-CM

## 2012-08-31 DIAGNOSIS — C8293 Follicular lymphoma, unspecified, intra-abdominal lymph nodes: Secondary | ICD-10-CM

## 2012-08-31 DIAGNOSIS — C8589 Other specified types of non-Hodgkin lymphoma, extranodal and solid organ sites: Secondary | ICD-10-CM | POA: Insufficient documentation

## 2012-08-31 MED ORDER — ALLOPURINOL 300 MG PO TABS: 300.0000 mg | ORAL_TABLET | Freq: Every day | ORAL | Status: DC

## 2012-08-31 MED ORDER — PREDNISONE 20 MG PO TABS: 100.0000 mg | ORAL_TABLET | Freq: Every day | ORAL | Status: DC

## 2012-08-31 MED ORDER — LIDOCAINE-PRILOCAINE 2.5-2.5 % EX CREA: TOPICAL_CREAM | CUTANEOUS | Status: DC | PRN

## 2012-08-31 MED ORDER — PROCHLORPERAZINE MALEATE 10 MG PO TABS: 10.0000 mg | ORAL_TABLET | Freq: Four times a day (QID) | ORAL | Status: DC | PRN

## 2012-08-31 NOTE — Telephone Encounter (Signed)
Gave pt appt for 1/22 labs, MD chemo for January and February 2014

## 2012-08-31 NOTE — Progress Notes (Signed)
   [facility]    OFFICE PROGRESS NOTE   INTERVAL HISTORY:   He returns as scheduled. The final pathology from the left abdominal mass biopsy confirmed involvement by a large B cell lymphoma, CD20 positive, with both follicular and diffuse patterns.  A bone marrow biopsy on 08/28/2012 was negative for involvement by lymphoma. He underwent placement of a Port-A-Cath yesterday.  He has noted once daily diarrhea this week. No nausea or vomiting. No other complaint.  Objective:  Vital signs in last 24 hours:  Blood pressure 136/66, pulse 69, temperature 97.9 F (36.6 C), temperature source Oral, resp. rate 20, height 5\' 10"  (1.778 m), weight 226 lb 11.2 oz (102.83 kg).   Resp: Lungs clear bilaterally Cardio: Regular rate and rhythm GI: No hepatomegaly, left mid to lower abdominal mass Vascular: No leg edema  Skin: Mild erythema in a tape distribution surrounding the right neck puncture site  Portacath/PICC-without erythema  Lab Results:  Lab Results  Component Value Date   WBC 5.6 08/28/2012   HGB 14.7 08/28/2012   HCT 43.2 08/28/2012   MCV 85.0 08/28/2012   PLT 238 08/28/2012   Echocardiogram 08/31/2012-mild concentric left ventricular hypertrophy, normal systolic function with an estimated ejection fracture between 60 and 65%, normal wall motion.   Medications: I have reviewed the patient's current medications.  Assessment/Plan: 1.Large Abdominal mass with surrounding small lymph nodes -status post a CT-guided biopsy on 07/20/2012 with the pathology suggestive of non-Hodgkin's lymphoma. Status post a laparoscopic biopsy of the left abdominal mass on 08/21/2011 with the final pathology confirming a large B-cell lymphoma, CD20 positive, with both follicular and diffuse patterns.          -negative staging bone marrow biopsy 2. Episodes of vomiting after brushing his tongue,? Related to the abdominal mass  3. Status post Port-A-Cath placement  08/30/2012   Disposition:  He has been diagnosed with large B-cell lymphoma. I discussed treatment options with [name] and his wife. He will complete a staging PET scan on 09/05/2012. I recommend treatment with CHOP-rituximab. He has attended a chemotherapy teaching class.  We reviewed the potential toxicities associated with this chemotherapy regimen including the chance for nausea/vomiting, mucositis, diarrhea, alopecia, and hematologic toxicity. We discussed the heart failure associated with doxorubicin. We discussed the vesicant property of doxorubicin and vincristine. We reviewed the neuropathy associated with vincristine. He understands the potential for an allergic reaction, reactivation of hepatitis, hematologic toxicity, and leukoencephalopathy with rituximab.  The plan is to proceed with a first cycle of CHOP/rituximab on 09/06/2012. He will complete a course of allopurinol surrounding the first cycle of chemotherapy. He will return for a nadir CBC on 09/17/2012.  Mr. Heacock is scheduled for an office visit and cycle 2 of chemotherapy on 09/27/2012. We will plan for a restaging x-ray evaluation after approximately 4 cycles of systemic therapy.   [name], MD  08/31/2012  5:00 PM

## 2012-08-31 NOTE — Progress Notes (Signed)
*  PRELIMINARY RESULTS* Echocardiogram 2D Echocardiogram has been performed.  Adam Alvarado 08/31/2012, 8:44 AM

## 2012-09-02 ENCOUNTER — Other Ambulatory Visit: Payer: Self-pay | Admitting: Oncology

## 2012-09-02 DIAGNOSIS — C8589 Other specified types of non-Hodgkin lymphoma, extranodal and solid organ sites: Secondary | ICD-10-CM

## 2012-09-02 DIAGNOSIS — C8593 Non-Hodgkin lymphoma, unspecified, intra-abdominal lymph nodes: Secondary | ICD-10-CM

## 2012-09-03 ENCOUNTER — Other Ambulatory Visit (HOSPITAL_COMMUNITY): Payer: BC Managed Care – PPO

## 2012-09-05 ENCOUNTER — Encounter (HOSPITAL_COMMUNITY)
Admission: RE | Admit: 2012-09-05 | Discharge: 2012-09-05 | Disposition: A | Discharge status: Home / Self Care | Payer: BC Managed Care – PPO | Source: Ambulatory Visit | Attending: Oncology | Admitting: Oncology

## 2012-09-05 ENCOUNTER — Other Ambulatory Visit (HOSPITAL_BASED_OUTPATIENT_CLINIC_OR_DEPARTMENT_OTHER): Payer: BC Managed Care – PPO

## 2012-09-05 ENCOUNTER — Encounter: Payer: Self-pay | Admitting: Oncology

## 2012-09-05 ENCOUNTER — Other Ambulatory Visit: Payer: Self-pay | Admitting: *Deleted

## 2012-09-05 ENCOUNTER — Telehealth: Payer: Self-pay | Admitting: Oncology

## 2012-09-05 DIAGNOSIS — C8589 Other specified types of non-Hodgkin lymphoma, extranodal and solid organ sites: Secondary | ICD-10-CM | POA: Insufficient documentation

## 2012-09-05 DIAGNOSIS — C8590 Non-Hodgkin lymphoma, unspecified, unspecified site: Secondary | ICD-10-CM

## 2012-09-05 DIAGNOSIS — C8583 Other specified types of non-Hodgkin lymphoma, intra-abdominal lymph nodes: Secondary | ICD-10-CM

## 2012-09-05 LAB — CBC WITH DIFFERENTIAL/PLATELET
Basophils Absolute: 0 10*3/uL (ref 0.0–0.1)
Eosinophils Absolute: 0.1 10*3/uL (ref 0.0–0.5)
HCT: 46.4 % (ref 38.4–49.9)
HGB: 15.4 g/dL (ref 13.0–17.1)
MCV: 85.4 fL (ref 79.3–98.0)
MONO%: 10.9 % (ref 0.0–14.0)
NEUT#: 4.7 10*3/uL (ref 1.5–6.5)
NEUT%: 77.4 % — ABNORMAL HIGH (ref 39.0–75.0)
RDW: 13.4 % (ref 11.0–14.6)

## 2012-09-05 LAB — BASIC METABOLIC PANEL (CC13)
BUN: 14 mg/dL (ref 7.0–26.0)
Chloride: 107 mEq/L (ref 98–107)
Creatinine: 1.1 mg/dL (ref 0.7–1.3)
Glucose: 96 mg/dl (ref 70–99)
Potassium: 4.3 mEq/L (ref 3.5–5.1)

## 2012-09-05 LAB — GLUCOSE, CAPILLARY: Glucose-Capillary: 94 mg/dL (ref 70–99)

## 2012-09-05 MED ORDER — FLUDEOXYGLUCOSE F - 18 (FDG) INJECTION
19.7000 | Freq: Once | INTRAVENOUS | Status: AC | PRN
  Administered 2012-09-05: 19.7 via INTRAVENOUS

## 2012-09-05 NOTE — Telephone Encounter (Signed)
pts wife stopped by as she thought  Dr Truett Perna needed him to have a lab on 2/3.  There are no lab orders in for that day and i have left a message for the desk nurse to add orders and i will call the wife with an appt when the pof is placed    anne

## 2012-09-05 NOTE — Progress Notes (Signed)
Received Health Care Power of Attorney papers from [place]. Forward to Medical Records.

## 2012-09-05 NOTE — Telephone Encounter (Signed)
s/w pt and she  aware of the lab appt on 2/3

## 2012-09-05 NOTE — Progress Notes (Signed)
Wife came in and I had her sign the [employer] co-pay assistance form. She said he may not have it, but I advised her if he does, I have already signed him up for it. She inquired about financial assistance. I advised her she can apply if need be. I gave her my card.

## 2012-09-06 ENCOUNTER — Ambulatory Visit: Payer: BC Managed Care – PPO | Admitting: Oncology

## 2012-09-06 ENCOUNTER — Ambulatory Visit (HOSPITAL_BASED_OUTPATIENT_CLINIC_OR_DEPARTMENT_OTHER): Payer: BC Managed Care – PPO

## 2012-09-06 VITALS — BP 142/67 | HR 69 | Temp 97.0°F | Resp 18

## 2012-09-06 DIAGNOSIS — Z5112 Encounter for antineoplastic immunotherapy: Secondary | ICD-10-CM

## 2012-09-06 DIAGNOSIS — C8589 Other specified types of non-Hodgkin lymphoma, extranodal and solid organ sites: Secondary | ICD-10-CM

## 2012-09-06 DIAGNOSIS — Z5111 Encounter for antineoplastic chemotherapy: Secondary | ICD-10-CM

## 2012-09-06 DIAGNOSIS — C8583 Other specified types of non-Hodgkin lymphoma, intra-abdominal lymph nodes: Secondary | ICD-10-CM

## 2012-09-06 LAB — HEPATITIS B CORE ANTIBODY, TOTAL: Hep B Core Total Ab: NEGATIVE

## 2012-09-06 MED ORDER — DIPHENHYDRAMINE HCL 25 MG PO CAPS
50.0000 mg | ORAL_CAPSULE | Freq: Once | ORAL | Status: AC
  Administered 2012-09-06: 50 mg via ORAL

## 2012-09-06 MED ORDER — SODIUM CHLORIDE 0.9 % IV SOLN
375.0000 mg/m2 | Freq: Once | INTRAVENOUS | Status: AC
  Administered 2012-09-06: 800 mg via INTRAVENOUS
  Filled 2012-09-06: qty 80

## 2012-09-06 MED ORDER — ACETAMINOPHEN 325 MG PO TABS
650.0000 mg | ORAL_TABLET | Freq: Once | ORAL | Status: AC
  Administered 2012-09-06: 650 mg via ORAL

## 2012-09-06 MED ORDER — VINCRISTINE SULFATE CHEMO INJECTION 1 MG/ML
2.0000 mg | Freq: Once | INTRAVENOUS | Status: AC
  Administered 2012-09-06: 2 mg via INTRAVENOUS
  Filled 2012-09-06: qty 2

## 2012-09-06 MED ORDER — SODIUM CHLORIDE 0.9 % IV SOLN
750.0000 mg/m2 | Freq: Once | INTRAVENOUS | Status: AC
  Administered 2012-09-06: 1680 mg via INTRAVENOUS
  Filled 2012-09-06: qty 84

## 2012-09-06 MED ORDER — DOXORUBICIN HCL CHEMO IV INJECTION 2 MG/ML
50.0000 mg/m2 | Freq: Once | INTRAVENOUS | Status: AC
  Administered 2012-09-06: 112 mg via INTRAVENOUS
  Filled 2012-09-06: qty 56

## 2012-09-06 MED ORDER — SODIUM CHLORIDE 0.9 % IV SOLN
Freq: Once | INTRAVENOUS | Status: AC
  Administered 2012-09-06: 10:00:00 via INTRAVENOUS

## 2012-09-06 MED ORDER — SODIUM CHLORIDE 0.9 % IJ SOLN
10.0000 mL | INTRAMUSCULAR | Status: DC | PRN
  Administered 2012-09-06: 10 mL
  Filled 2012-09-06: qty 10

## 2012-09-06 MED ORDER — HEPARIN SOD (PORK) LOCK FLUSH 100 UNIT/ML IV SOLN
500.0000 [IU] | Freq: Once | INTRAVENOUS | Status: AC | PRN
  Administered 2012-09-06: 500 [IU]
  Filled 2012-09-06: qty 5

## 2012-09-06 MED ORDER — ONDANSETRON 16 MG/50ML IVPB (CHCC)
16.0000 mg | Freq: Once | INTRAVENOUS | Status: AC
  Administered 2012-09-06: 16 mg via INTRAVENOUS

## 2012-09-06 NOTE — Patient Instructions (Addendum)
[facility] Discharge Instructions for Patients Receiving Chemotherapy  Today you received the following chemotherapy agents: Adriamycin, Cytoxan, Vincristine, Rituxan  To help prevent nausea and vomiting after your treatment, we encourage you to take your nausea medication as directed by your MD. If you develop nausea and vomiting that is not controlled by your nausea medication, call the clinic. If it is after clinic hours your family physician or the after hours number for the clinic or go to the Emergency Department.   BELOW ARE SYMPTOMS THAT SHOULD BE REPORTED IMMEDIATELY:  *FEVER GREATER THAN 100.5 F  *CHILLS WITH OR WITHOUT FEVER  NAUSEA AND VOMITING THAT IS NOT CONTROLLED WITH YOUR NAUSEA MEDICATION  *UNUSUAL SHORTNESS OF BREATH  *UNUSUAL BRUISING OR BLEEDING  TENDERNESS IN MOUTH AND THROAT WITH OR WITHOUT PRESENCE OF ULCERS  *URINARY PROBLEMS  *BOWEL PROBLEMS  UNUSUAL RASH Items with * indicate a potential emergency and should be followed up as soon as possible.  One of the nurses will contact you 24 hours after your treatment. Please let the nurse know about any problems that you may have experienced. Feel free to call the clinic you have any questions or concerns. The clinic phone number is [phone].

## 2012-09-07 ENCOUNTER — Telehealth: Payer: Self-pay | Admitting: *Deleted

## 2012-09-07 NOTE — Telephone Encounter (Signed)
Left VM for patient to call office with update on how he is doing post chemo and if he has any questions.

## 2012-09-07 NOTE — Telephone Encounter (Signed)
Message copied by Wandalee Ferdinand on Fri Sep 07, 2012 10:03 AM ------      Message from: Faith Rogue F      Created: Thu Sep 06, 2012 10:16 AM      Regarding: chemo f/u call       Adriamycin, Vincristine, Cytoxan, Rituxan            Dr. Truett Perna            Mobile number is the best number to call.

## 2012-09-11 ENCOUNTER — Telehealth: Payer: Self-pay | Admitting: *Deleted

## 2012-09-11 NOTE — Telephone Encounter (Signed)
PT. CALLED WIFE AT WORK CONCERNING FEVER AND HAVING "SHIVERS". CALLED PT. AT HOME. HE HAS A DRY COUGH. PT. IS NOT HAVING URINARY PROBLEMS. THERE ARE NO SIGNS OF INFECTION AT PT.'S PORT A CATH SITE. VERBAL ORDER AND READ BACK TO DR.SHERRILL- MAY TAKE TYLENOL FOR FEVER. IF TEMPERATURE GOES ABOVE 100.8 AND/OR PT. HAS SHAKING CHILLS; HE NEEDS TO CALL THE ON CALL PHYSICIAN AND GO TO THE EMERGENCY ROOM. NOTIFIED PT. AND HIS WIFE OF THE ABOVE INSTRUCTIONS. PT. STATES HE TOLD DR.SHERRILL TYLENOL DOES NOT WORK FOR HIM AND DR.SHERRILL SAID IT WOULD BE OK TO USE ASPIRIN. PT. AND HIS WIFE VOICE UNDERSTANDING OF THE ABOVE INSTRUCTIONS.

## 2012-09-13 ENCOUNTER — Telehealth: Payer: Self-pay | Admitting: *Deleted

## 2012-09-13 NOTE — Telephone Encounter (Signed)
Received vm call from pt reporting constipation with no BM in 3 days & has taken prune juice & eating roughage foods.  Returned call to pt @ 5pm & discussed miralax & encouraged to get & start daily prn.  He reports his wife bought docusate sodium.  Informed OK to take daily but to also get miralax to see if we can get things moving. He reports he has one allopurinol left & states he is having a problem with not knowing when he is finished voiding.  He reports that he is urinating well & thinks he is finished but isn't & has wet himself.  He thought it was from the allopurinol but this nurse suggested it might be from the chemo & to observe & if this doesn't improve to make sure Dr Truett Perna knows about it.  Asked if he has run any fever & he reports not since 09/11/12 & he states no rash, no burning when he urinates & no blood in urine.  Encouraged to finish allopurinol & to start miralax & stool softener & to call back if no improvement.

## 2012-09-14 ENCOUNTER — Encounter (INDEPENDENT_AMBULATORY_CARE_PROVIDER_SITE_OTHER): Payer: Self-pay | Admitting: General Surgery

## 2012-09-14 ENCOUNTER — Ambulatory Visit (INDEPENDENT_AMBULATORY_CARE_PROVIDER_SITE_OTHER): Payer: BC Managed Care – PPO | Admitting: General Surgery

## 2012-09-14 VITALS — BP 136/78 | HR 74 | Temp 97.9°F | Resp 18 | Ht 70.0 in | Wt 223.2 lb

## 2012-09-14 DIAGNOSIS — C8589 Other specified types of non-Hodgkin lymphoma, extranodal and solid organ sites: Secondary | ICD-10-CM

## 2012-09-14 DIAGNOSIS — R1902 Left upper quadrant abdominal swelling, mass and lump: Secondary | ICD-10-CM

## 2012-09-14 NOTE — Patient Instructions (Signed)
Your abdominal wounds have healed normally without any sign of any complication.  You may resume normal physical activities without restriction.  For yojur constipation, I would drink lots of water, take MiralaX 2, 3, or even 4 times a day. I would also take 3 Metamucil capsules twice a day.  Return to see Dr. Derrell Lolling if further problems arise.

## 2012-09-14 NOTE — Progress Notes (Signed)
Patient ID: Adam Alvarado, male   DOB: August 18, 1955, 57 y.o.   MRN: [id number] History: This gentleman underwent laparoscopic biopsy of a mesenteric and retroperitoneal mass on 08/20/2012. He recovered uneventfully. Pathology showed a follicular B-cell lymphoma, large cell type. Radiology put a port in and he is receiving chemotherapy. He does have some side effects but he's tolerating this with good spirits. He's had some constipation. We talked about that for some time.  Exam: Patient looks well. No distress. Vital signs stable. Weight 223. Heart rate 74. BP 136/78 Abdomen soft. Nontender. Trocar sites well-healed. I don't really feel the abdominal mass anymore.  Assessment: Mesenteric and retroperitoneal mass, recovering uneventfully following laparoscopic biopsy Follicular B-cell lymphoma, large cell type. Clinically responding to chemotherapy, in my opinion. Status post Port-A-Cath insertion by interventional radiology  Plan: I Encouraged to him to go forward with his chemotherapy, which she states that he will do Strategies for constipation were discussed and written Return to see me if further problems arise.   Angelia Mould. Derrell Lolling, M.D., [facility] General and Minimally invasive Surgery Breast and Colorectal Surgery Office:   [phone] Pager:   [phone]

## 2012-09-17 ENCOUNTER — Other Ambulatory Visit (HOSPITAL_BASED_OUTPATIENT_CLINIC_OR_DEPARTMENT_OTHER): Payer: BC Managed Care – PPO

## 2012-09-17 DIAGNOSIS — C8589 Other specified types of non-Hodgkin lymphoma, extranodal and solid organ sites: Secondary | ICD-10-CM

## 2012-09-17 LAB — CBC WITH DIFFERENTIAL/PLATELET
BASO%: 1.6 % (ref 0.0–2.0)
Eosinophils Absolute: 0 10*3/uL (ref 0.0–0.5)
HCT: 43.5 % (ref 38.4–49.9)
MCHC: 33.5 g/dL (ref 32.0–36.0)
MONO#: 0.2 10*3/uL (ref 0.1–0.9)
NEUT#: 1.3 10*3/uL — ABNORMAL LOW (ref 1.5–6.5)
RBC: 5.15 10*6/uL (ref 4.20–5.82)
WBC: 2.3 10*3/uL — ABNORMAL LOW (ref 4.0–10.3)
lymph#: 0.7 10*3/uL — ABNORMAL LOW (ref 0.9–3.3)

## 2012-09-18 ENCOUNTER — Encounter: Payer: Self-pay | Admitting: *Deleted

## 2012-09-18 ENCOUNTER — Telehealth: Payer: Self-pay | Admitting: *Deleted

## 2012-09-18 NOTE — Telephone Encounter (Signed)
Message copied by Caleb Popp on Tue Sep 18, 2012  4:12 PM ------      Message from: [name] B      Created: Mon Sep 17, 2012  6:23 PM       Please call Adam Alvarado, counts look good, f/u as scheduled

## 2012-09-20 ENCOUNTER — Telehealth: Payer: Self-pay | Admitting: *Deleted

## 2012-09-20 MED ORDER — HYDROCORTISONE 2.5 % RE CREA: TOPICAL_CREAM | Freq: Two times a day (BID) | RECTAL | Status: DC

## 2012-09-20 NOTE — Telephone Encounter (Signed)
Message from pt reporting he developed hemorrhoids after chemo induced constipation. Requesting prescription hemorrhoid treatment. OTC preparation is not effective.

## 2012-09-27 ENCOUNTER — Ambulatory Visit (HOSPITAL_BASED_OUTPATIENT_CLINIC_OR_DEPARTMENT_OTHER): Payer: BC Managed Care – PPO

## 2012-09-27 ENCOUNTER — Telehealth: Payer: Self-pay | Admitting: *Deleted

## 2012-09-27 ENCOUNTER — Ambulatory Visit (HOSPITAL_BASED_OUTPATIENT_CLINIC_OR_DEPARTMENT_OTHER): Payer: BC Managed Care – PPO | Admitting: Oncology

## 2012-09-27 ENCOUNTER — Telehealth: Payer: Self-pay | Admitting: Oncology

## 2012-09-27 ENCOUNTER — Other Ambulatory Visit (HOSPITAL_BASED_OUTPATIENT_CLINIC_OR_DEPARTMENT_OTHER): Payer: BC Managed Care – PPO | Admitting: Lab

## 2012-09-27 VITALS — BP 163/67 | HR 74 | Temp 98.9°F | Resp 20 | Ht 70.0 in | Wt 227.4 lb

## 2012-09-27 VITALS — BP 126/62 | HR 67 | Temp 98.0°F

## 2012-09-27 DIAGNOSIS — Z5111 Encounter for antineoplastic chemotherapy: Secondary | ICD-10-CM

## 2012-09-27 DIAGNOSIS — Z5112 Encounter for antineoplastic immunotherapy: Secondary | ICD-10-CM

## 2012-09-27 DIAGNOSIS — C8589 Other specified types of non-Hodgkin lymphoma, extranodal and solid organ sites: Secondary | ICD-10-CM

## 2012-09-27 DIAGNOSIS — C8293 Follicular lymphoma, unspecified, intra-abdominal lymph nodes: Secondary | ICD-10-CM

## 2012-09-27 DIAGNOSIS — R112 Nausea with vomiting, unspecified: Secondary | ICD-10-CM

## 2012-09-27 DIAGNOSIS — C8590 Non-Hodgkin lymphoma, unspecified, unspecified site: Secondary | ICD-10-CM

## 2012-09-27 LAB — CBC WITH DIFFERENTIAL/PLATELET
Basophils Absolute: 0 10*3/uL (ref 0.0–0.1)
Eosinophils Absolute: 0 10*3/uL (ref 0.0–0.5)
HCT: 45 % (ref 38.4–49.9)
HGB: 15.1 g/dL (ref 13.0–17.1)
MONO#: 0.8 10*3/uL (ref 0.1–0.9)
NEUT%: 54.2 % (ref 39.0–75.0)
Platelets: 258 10*3/uL (ref 140–400)
WBC: 4 10*3/uL (ref 4.0–10.3)
lymph#: 1.1 10*3/uL (ref 0.9–3.3)

## 2012-09-27 LAB — COMPREHENSIVE METABOLIC PANEL (CC13)
ALT: 33 U/L (ref 0–55)
BUN: 17.3 mg/dL (ref 7.0–26.0)
CO2: 24 mEq/L (ref 22–29)
Calcium: 8.9 mg/dL (ref 8.4–10.4)
Chloride: 109 mEq/L — ABNORMAL HIGH (ref 98–107)
Creatinine: 1.2 mg/dL (ref 0.7–1.3)
Glucose: 127 mg/dl — ABNORMAL HIGH (ref 70–99)

## 2012-09-27 MED ORDER — SODIUM CHLORIDE 0.9 % IV SOLN
2.0000 mg | Freq: Once | INTRAVENOUS | Status: AC
  Administered 2012-09-27: 2 mg via INTRAVENOUS
  Filled 2012-09-27: qty 2

## 2012-09-27 MED ORDER — HEPARIN SOD (PORK) LOCK FLUSH 100 UNIT/ML IV SOLN
500.0000 [IU] | Freq: Once | INTRAVENOUS | Status: AC | PRN
  Administered 2012-09-27: 500 [IU]
  Filled 2012-09-27: qty 5

## 2012-09-27 MED ORDER — ACETAMINOPHEN 325 MG PO TABS
650.0000 mg | ORAL_TABLET | Freq: Once | ORAL | Status: AC
  Administered 2012-09-27: 650 mg via ORAL

## 2012-09-27 MED ORDER — SODIUM CHLORIDE 0.9 % IV SOLN
375.0000 mg/m2 | Freq: Once | INTRAVENOUS | Status: AC
  Administered 2012-09-27: 800 mg via INTRAVENOUS
  Filled 2012-09-27: qty 80

## 2012-09-27 MED ORDER — DIPHENHYDRAMINE HCL 25 MG PO CAPS
50.0000 mg | ORAL_CAPSULE | Freq: Once | ORAL | Status: AC
  Administered 2012-09-27: 50 mg via ORAL

## 2012-09-27 MED ORDER — DOXORUBICIN HCL CHEMO IV INJECTION 2 MG/ML
50.0000 mg/m2 | Freq: Once | INTRAVENOUS | Status: AC
  Administered 2012-09-27: 112 mg via INTRAVENOUS
  Filled 2012-09-27: qty 56

## 2012-09-27 MED ORDER — SODIUM CHLORIDE 0.9 % IV SOLN
Freq: Once | INTRAVENOUS | Status: AC
  Administered 2012-09-27: 10:00:00 via INTRAVENOUS

## 2012-09-27 MED ORDER — PALONOSETRON HCL INJECTION 0.25 MG/5ML
0.2500 mg | Freq: Once | INTRAVENOUS | Status: AC
  Administered 2012-09-27: 0.25 mg via INTRAVENOUS

## 2012-09-27 MED ORDER — SODIUM CHLORIDE 0.9 % IJ SOLN
10.0000 mL | INTRAMUSCULAR | Status: DC | PRN
  Administered 2012-09-27: 10 mL
  Filled 2012-09-27: qty 10

## 2012-09-27 MED ORDER — LORAZEPAM 1 MG PO TABS: 1.0000 mg | ORAL_TABLET | Freq: Four times a day (QID) | ORAL | Status: DC | PRN

## 2012-09-27 MED ORDER — DEXAMETHASONE SODIUM PHOSPHATE 10 MG/ML IJ SOLN
10.0000 mg | Freq: Once | INTRAMUSCULAR | Status: AC
  Administered 2012-09-27: 10 mg via INTRAVENOUS

## 2012-09-27 MED ORDER — SODIUM CHLORIDE 0.9 % IV SOLN
750.0000 mg/m2 | Freq: Once | INTRAVENOUS | Status: AC
  Administered 2012-09-27: 1680 mg via INTRAVENOUS
  Filled 2012-09-27: qty 84

## 2012-09-27 MED ORDER — FOSAPREPITANT DIMEGLUMINE INJECTION 150 MG
150.0000 mg | Freq: Once | INTRAVENOUS | Status: AC
  Administered 2012-09-27: 150 mg via INTRAVENOUS
  Filled 2012-09-27: qty 5

## 2012-09-27 NOTE — Progress Notes (Signed)
1225- Rituxan started at x50cchr. 1245 pt complained of feeling sweaty and flushed. Rituxan stopped and normal saline at 100cc/hr started. 1300 pt feels better all symptom subsided.

## 2012-09-27 NOTE — Progress Notes (Signed)
   [facility]    OFFICE PROGRESS NOTE   INTERVAL HISTORY:   He returns as scheduled. He completed a first cycle of CHOP/rituximab on 09/06/2012. He had nausea and vomiting for 2-3 days beginning on the evening of chemotherapy. Compazine helped the nausea partially. He reports a few days of diarrhea followed by constipation. He developed irritation of hemorrhoids after the episode of constipation. The hemorrhoids improved with Anusol. No neuropathy symptoms. No mouth sores. He felt well over the past week and returned to work.  Objective:  Vital signs in last 24 hours:  Blood pressure 163/67, pulse 74, temperature 98.9 F (37.2 C), temperature source Oral, resp. rate 20, height 5\' 10"  (1.778 m), weight 227 lb 6.4 oz (103.148 kg).    HEENT: No thrush or ulcers Resp: Lungs clear bilaterally Cardio: Regular rate and rhythm GI: Nontender, no mass, no hepatosplenomegaly Vascular: No leg edema   Portacath/PICC-without erythema  Lab Results:  Lab Results  Component Value Date   WBC 4.0 09/27/2012   HGB 15.1 09/27/2012   HCT 45.0 09/27/2012   MCV 85.4 09/27/2012   PLT 258 09/27/2012   ANC 2.2  ANC 1.3 on 09/17/2012   Medications: I have reviewed the patient's current medications.  Assessment/Plan: 1.Large Abdominal mass with surrounding small lymph nodes -status post a CT-guided biopsy on 07/20/2012 with the pathology suggestive of non-Hodgkin's lymphoma. Status post a laparoscopic biopsy of the left abdominal mass on 08/21/2011 with the final pathology confirming a large B-cell lymphoma, CD20 positive, with both follicular and diffuse patterns.                                                                                         -negative staging bone marrow biopsy  -Cycle 1 CHOP/Rituxan on 09/06/2012 2. Episodes of vomiting after brushing his tongue,? Related to the abdominal mass  3. Status post Port-A-Cath placement 08/30/2012  4. Nausea/vomiting following  cycle 1 CHOP/rituximab  Disposition:  He completed one cycle of CHOP/rituximab on 09/06/2012. The chemotherapy was complicated by nausea/vomiting lasting for several days. We will adjust antiemetic regimen today by adding Aloxi, Decadron, and Emend. He was given a prescription for Ativan to use as needed. Mr. Wandel will return for a nadir CBC on 10/08/2012. He is scheduled for an office visit and cycle 3 of chemotherapy on 10/18/2012.   [name], MD  09/27/2012  9:46 AM

## 2012-09-27 NOTE — Patient Instructions (Addendum)
[facility] Discharge Instructions for Patients Receiving Chemotherapy  Today you received the following chemotherapy agents Rituxan, Adriamycin, Vincristine and Cytoxan.  To help prevent nausea and vomiting after your treatment, we encourage you to take your nausea medication as prescribed.    If you develop nausea and vomiting that is not controlled by your nausea medication, call the clinic. If it is after clinic hours your family physician or the after hours number for the clinic or go to the Emergency Department.   BELOW ARE SYMPTOMS THAT SHOULD BE REPORTED IMMEDIATELY:  *FEVER GREATER THAN 100.5 F  *CHILLS WITH OR WITHOUT FEVER  NAUSEA AND VOMITING THAT IS NOT CONTROLLED WITH YOUR NAUSEA MEDICATION  *UNUSUAL SHORTNESS OF BREATH  *UNUSUAL BRUISING OR BLEEDING  TENDERNESS IN MOUTH AND THROAT WITH OR WITHOUT PRESENCE OF ULCERS  *URINARY PROBLEMS  *BOWEL PROBLEMS  UNUSUAL RASH Items with * indicate a potential emergency and should be followed up as soon as possible.   Please let the nurse know about any problems that you may have experienced. Feel free to call the clinic you have any questions or concerns. The clinic phone number is [phone].   I have been informed and understand all the instructions given to me. I know to contact the clinic, my physician, or go to the Emergency Department if any problems should occur. I do not have any questions at this time, but understand that I may call the clinic during office hours   should I have any questions or need assistance in obtaining follow up care.    __________________________________________  _____________  __________ Signature of Patient or Authorized Representative            Date                   Time    __________________________________________ Nurse's Signature

## 2012-09-27 NOTE — Telephone Encounter (Signed)
Per staff message and POF I have scheduled appts.  JMW  

## 2012-09-27 NOTE — Telephone Encounter (Signed)
gv and printed appt schedule for pt for Feb and March....pt aware....emailed  michelle to add tx

## 2012-09-29 ENCOUNTER — Other Ambulatory Visit: Payer: Self-pay

## 2012-10-08 ENCOUNTER — Other Ambulatory Visit: Payer: BC Managed Care – PPO

## 2012-10-10 ENCOUNTER — Other Ambulatory Visit: Payer: Self-pay | Admitting: *Deleted

## 2012-10-10 ENCOUNTER — Other Ambulatory Visit (HOSPITAL_BASED_OUTPATIENT_CLINIC_OR_DEPARTMENT_OTHER): Payer: BC Managed Care – PPO

## 2012-10-10 DIAGNOSIS — C8589 Other specified types of non-Hodgkin lymphoma, extranodal and solid organ sites: Secondary | ICD-10-CM

## 2012-10-10 LAB — CBC WITH DIFFERENTIAL/PLATELET
BASO%: 1.7 % (ref 0.0–2.0)
HCT: 39.2 % (ref 38.4–49.9)
HGB: 13.3 g/dL (ref 13.0–17.1)
MCHC: 33.9 g/dL (ref 32.0–36.0)
MONO#: 0.3 10*3/uL (ref 0.1–0.9)
NEUT#: 0.3 10*3/uL — CL (ref 1.5–6.5)
NEUT%: 24.1 % — ABNORMAL LOW (ref 39.0–75.0)
WBC: 1.2 10*3/uL — ABNORMAL LOW (ref 4.0–10.3)
lymph#: 0.6 10*3/uL — ABNORMAL LOW (ref 0.9–3.3)

## 2012-10-10 MED ORDER — CIPROFLOXACIN HCL 500 MG PO TABS: 500.0000 mg | ORAL_TABLET | Freq: Two times a day (BID) | ORAL | Status: DC

## 2012-10-10 NOTE — Telephone Encounter (Signed)
Spoke with pt; per Dr. Truett Perna instructed pt to start Cipro BID x 7 days due to white count low and will re-check labs again on Friday 10/12/12.  Pt verbalized understanding of instructions.

## 2012-10-11 ENCOUNTER — Telehealth: Payer: Self-pay | Admitting: Oncology

## 2012-10-11 NOTE — Telephone Encounter (Signed)
Pt called to scheduled lab appt 02/28 @ 8:30

## 2012-10-12 ENCOUNTER — Other Ambulatory Visit (HOSPITAL_BASED_OUTPATIENT_CLINIC_OR_DEPARTMENT_OTHER): Payer: BC Managed Care – PPO | Admitting: Lab

## 2012-10-12 ENCOUNTER — Telehealth: Payer: Self-pay | Admitting: *Deleted

## 2012-10-12 DIAGNOSIS — C8589 Other specified types of non-Hodgkin lymphoma, extranodal and solid organ sites: Secondary | ICD-10-CM

## 2012-10-12 LAB — CBC WITH DIFFERENTIAL/PLATELET
Basophils Absolute: 0 10*3/uL (ref 0.0–0.1)
Eosinophils Absolute: 0 10*3/uL (ref 0.0–0.5)
HGB: 13.6 g/dL (ref 13.0–17.1)
MCV: 84.3 fL (ref 79.3–98.0)
MONO%: 41 % — ABNORMAL HIGH (ref 0.0–14.0)
NEUT#: 0.1 10*3/uL — CL (ref 1.5–6.5)
RDW: 13.7 % (ref 11.0–14.6)
lymph#: 0.6 10*3/uL — ABNORMAL LOW (ref 0.9–3.3)

## 2012-10-12 NOTE — Telephone Encounter (Signed)
Patient here,  waiting on lab results.  Discussed with Dr. Truett Perna:  Talked with pt. And his mother, who is a Engineer, civil (consulting).  He is still neutropenic.  He is to stay on his cipro.  Discussed neutropenic precautions with them both.  Also gave him written information on "Preventing Infection during treatment".  Pt. Seems to have a good understanding on precautions and seems to be following instructions to prevent infection.  We have given him a mask to wear in our lobby.   He knows to call immediately for any fever or shaking chills.  He will return on 3/6 for lab, to see Lonna Cobb and possible tx.

## 2012-10-18 ENCOUNTER — Ambulatory Visit (HOSPITAL_BASED_OUTPATIENT_CLINIC_OR_DEPARTMENT_OTHER): Payer: BC Managed Care – PPO | Admitting: Nurse Practitioner

## 2012-10-18 ENCOUNTER — Telehealth: Payer: Self-pay | Admitting: *Deleted

## 2012-10-18 ENCOUNTER — Other Ambulatory Visit (HOSPITAL_BASED_OUTPATIENT_CLINIC_OR_DEPARTMENT_OTHER): Payer: BC Managed Care – PPO

## 2012-10-18 ENCOUNTER — Other Ambulatory Visit: Payer: Self-pay | Admitting: Oncology

## 2012-10-18 ENCOUNTER — Ambulatory Visit (HOSPITAL_BASED_OUTPATIENT_CLINIC_OR_DEPARTMENT_OTHER): Payer: BC Managed Care – PPO

## 2012-10-18 ENCOUNTER — Telehealth: Payer: Self-pay | Admitting: Oncology

## 2012-10-18 VITALS — BP 130/74 | HR 74 | Temp 97.0°F | Resp 20 | Ht 70.0 in | Wt 226.4 lb

## 2012-10-18 VITALS — BP 131/66 | HR 69 | Temp 98.1°F | Resp 18

## 2012-10-18 DIAGNOSIS — C8589 Other specified types of non-Hodgkin lymphoma, extranodal and solid organ sites: Secondary | ICD-10-CM

## 2012-10-18 DIAGNOSIS — Z5111 Encounter for antineoplastic chemotherapy: Secondary | ICD-10-CM

## 2012-10-18 DIAGNOSIS — Z5112 Encounter for antineoplastic immunotherapy: Secondary | ICD-10-CM

## 2012-10-18 LAB — COMPREHENSIVE METABOLIC PANEL (CC13)
CO2: 25 mEq/L (ref 22–29)
Calcium: 9.3 mg/dL (ref 8.4–10.4)
Creatinine: 1.1 mg/dL (ref 0.7–1.3)
Glucose: 120 mg/dl — ABNORMAL HIGH (ref 70–99)
Total Bilirubin: 0.24 mg/dL (ref 0.20–1.20)
Total Protein: 6.4 g/dL (ref 6.4–8.3)

## 2012-10-18 LAB — CBC WITH DIFFERENTIAL/PLATELET
Eosinophils Absolute: 0 10*3/uL (ref 0.0–0.5)
HCT: 42.5 % (ref 38.4–49.9)
HGB: 14.7 g/dL (ref 13.0–17.1)
LYMPH%: 11.1 % — ABNORMAL LOW (ref 14.0–49.0)
MONO#: 1.1 10*3/uL — ABNORMAL HIGH (ref 0.1–0.9)
NEUT#: 4.1 10*3/uL (ref 1.5–6.5)
NEUT%: 70.3 % (ref 39.0–75.0)
Platelets: 260 10*3/uL (ref 140–400)
WBC: 5.9 10*3/uL (ref 4.0–10.3)

## 2012-10-18 MED ORDER — DEXAMETHASONE SODIUM PHOSPHATE 10 MG/ML IJ SOLN
10.0000 mg | Freq: Once | INTRAMUSCULAR | Status: AC
  Administered 2012-10-18: 10 mg via INTRAVENOUS

## 2012-10-18 MED ORDER — DOXORUBICIN HCL CHEMO IV INJECTION 2 MG/ML
50.0000 mg/m2 | Freq: Once | INTRAVENOUS | Status: AC
  Administered 2012-10-18: 112 mg via INTRAVENOUS
  Filled 2012-10-18: qty 56

## 2012-10-18 MED ORDER — VINCRISTINE SULFATE CHEMO INJECTION 1 MG/ML
2.0000 mg | Freq: Once | INTRAVENOUS | Status: AC
  Administered 2012-10-18: 2 mg via INTRAVENOUS
  Filled 2012-10-18: qty 2

## 2012-10-18 MED ORDER — SODIUM CHLORIDE 0.9 % IV SOLN
375.0000 mg/m2 | Freq: Once | INTRAVENOUS | Status: AC
  Administered 2012-10-18: 800 mg via INTRAVENOUS
  Filled 2012-10-18: qty 80

## 2012-10-18 MED ORDER — HEPARIN SOD (PORK) LOCK FLUSH 100 UNIT/ML IV SOLN
500.0000 [IU] | Freq: Once | INTRAVENOUS | Status: AC | PRN
  Administered 2012-10-18: 500 [IU]
  Filled 2012-10-18: qty 5

## 2012-10-18 MED ORDER — PALONOSETRON HCL INJECTION 0.25 MG/5ML
0.2500 mg | Freq: Once | INTRAVENOUS | Status: AC
  Administered 2012-10-18: 0.25 mg via INTRAVENOUS

## 2012-10-18 MED ORDER — SODIUM CHLORIDE 0.9 % IV SOLN
150.0000 mg | Freq: Once | INTRAVENOUS | Status: AC
  Administered 2012-10-18: 150 mg via INTRAVENOUS
  Filled 2012-10-18: qty 5

## 2012-10-18 MED ORDER — ACETAMINOPHEN 325 MG PO TABS
650.0000 mg | ORAL_TABLET | Freq: Once | ORAL | Status: AC
  Administered 2012-10-18: 650 mg via ORAL

## 2012-10-18 MED ORDER — SODIUM CHLORIDE 0.9 % IV SOLN
Freq: Once | INTRAVENOUS | Status: AC
  Administered 2012-10-18: 11:00:00 via INTRAVENOUS

## 2012-10-18 MED ORDER — SODIUM CHLORIDE 0.9 % IJ SOLN
10.0000 mL | INTRAMUSCULAR | Status: DC | PRN
  Administered 2012-10-18: 10 mL
  Filled 2012-10-18: qty 10

## 2012-10-18 MED ORDER — DIPHENHYDRAMINE HCL 25 MG PO CAPS
50.0000 mg | ORAL_CAPSULE | Freq: Once | ORAL | Status: AC
  Administered 2012-10-18: 50 mg via ORAL

## 2012-10-18 MED ORDER — BACLOFEN 10 MG PO TABS: 5.0000 mg | ORAL_TABLET | Freq: Three times a day (TID) | ORAL | Status: DC | PRN

## 2012-10-18 MED ORDER — SODIUM CHLORIDE 0.9 % IV SOLN
750.0000 mg/m2 | Freq: Once | INTRAVENOUS | Status: AC
  Administered 2012-10-18: 1680 mg via INTRAVENOUS
  Filled 2012-10-18: qty 84

## 2012-10-18 NOTE — Telephone Encounter (Signed)
, °

## 2012-10-18 NOTE — Telephone Encounter (Signed)
Per staff phone call and POF I have schedueld appts.  JMW  

## 2012-10-18 NOTE — Patient Instructions (Addendum)
[facility] Discharge Instructions for Patients Receiving Chemotherapy  Today you received the following chemotherapy agents Adriamycin, Vincristine, Cytoxan, and Rituxan.  To help prevent nausea and vomiting after your treatment, we encourage you to take your nausea medication.   If you develop nausea and vomiting that is not controlled by your nausea medication, call the clinic. If it is after clinic hours your family physician or the after hours number for the clinic or go to the Emergency Department.   BELOW ARE SYMPTOMS THAT SHOULD BE REPORTED IMMEDIATELY:  *FEVER GREATER THAN 100.5 F  *CHILLS WITH OR WITHOUT FEVER  NAUSEA AND VOMITING THAT IS NOT CONTROLLED WITH YOUR NAUSEA MEDICATION  *UNUSUAL SHORTNESS OF BREATH  *UNUSUAL BRUISING OR BLEEDING  TENDERNESS IN MOUTH AND THROAT WITH OR WITHOUT PRESENCE OF ULCERS  *URINARY PROBLEMS  *BOWEL PROBLEMS  UNUSUAL RASH Items with * indicate a potential emergency and should be followed up as soon as possible.  One of the nurses will contact you 24 hours after your treatment. Please let the nurse know about any problems that you may have experienced. Feel free to call the clinic you have any questions or concerns. The clinic phone number is [phone].   I have been informed and understand all the instructions given to me. I know to contact the clinic, my physician, or go to the Emergency Department if any problems should occur. I do not have any questions at this time, but understand that I may call the clinic during office hours   should I have any questions or need assistance in obtaining follow up care.    __________________________________________  _____________  __________ Signature of Patient or Authorized Representative            Date                   Time    __________________________________________ Nurse's Signature

## 2012-10-18 NOTE — Progress Notes (Signed)
OFFICE PROGRESS NOTE  Interval history:  Adam Alvarado returns as scheduled. He completed cycle 2 CHOP/Rituxan on 09/27/2012. He had no nausea or vomiting following the chemotherapy. He developed hiccups a few days after the chemotherapy which lasted for 3-4 days. He vomited 1 time due to the hiccups. He denies mouth sores. No diarrhea. No numbness or tingling in his hands or feet.  The absolute neutrophil count returned at 0.3 on 10/10/2012 and at 0.1 on 10/12/2012. He denies any fever.   Objective: Blood pressure 130/74, pulse 74, temperature 97 F (36.1 C), temperature source Oral, resp. rate 20, height 5\' 10"  (1.778 m), weight 226 lb 6.4 oz (102.694 kg).  Oropharynx is without thrush or ulceration. Lungs are clear. Regular cardiac rhythm. Port-A-Cath site is without erythema. Abdomen is soft and nontender. No organomegaly. No mass. Extremities are without edema. Vibratory sense is intact over the fingertips per tuning fork exam.  Lab Results: Lab Results  Component Value Date   WBC 5.9 10/18/2012   HGB 14.7 10/18/2012   HCT 42.5 10/18/2012   MCV 85.0 10/18/2012   PLT 260 10/18/2012    Chemistry:    Chemistry      Component Value Date/Time   NA 142 10/18/2012 0920   NA 141 08/16/2012 1020   K 4.1 10/18/2012 0920   K 4.1 08/16/2012 1020   CL 106 10/18/2012 0920   CL 102 08/16/2012 1020   CO2 25 10/18/2012 0920   CO2 28 08/16/2012 1020   BUN 10.8 10/18/2012 0920   BUN 14 08/16/2012 1020   CREATININE 1.1 10/18/2012 0920   CREATININE 1.18 08/16/2012 1020      Component Value Date/Time   CALCIUM 9.3 10/18/2012 0920   CALCIUM 9.6 08/16/2012 1020   ALKPHOS 79 10/18/2012 0920   ALKPHOS 85 08/16/2012 1020   AST 18 10/18/2012 0920   AST 20 08/16/2012 1020   ALT 25 10/18/2012 0920   ALT 19 08/16/2012 1020   BILITOT 0.24 10/18/2012 0920   BILITOT 0.3 08/16/2012 1020       Studies/Results: No results found.  Medications: I have reviewed the patient's current medications.  Assessment/Plan:  1. Large abdominal mass  surrounding small lymph nodes status post CT-guided biopsy on 07/20/2012 with the pathology suggestive of non-Hodgkin's lymphoma. Status post laparoscopic biopsy of the left abdominal mass on 08/21/2011 with final pathology confirming a large B-cell lymphoma, CD20 positive, with both follicular and diffuse patterns. Negative staging bone marrow biopsy. Status post cycle 1 CHOP/Rituxan on 09/06/2012 and cycle 2 on 09/27/2012. 2. Nausea/vomiting following cycle 1 CHOP/Rituxan. Antiemetic regimen was adjusted with cycle 2 to include Aloxi, Decadron and Emend. He had no nausea following cycle 2. 3. Status post Port-A-Cath placement 08/30/2012. 4. Neutropenia following cycle 2 CHOP/Rituxan. Neulasta will be added beginning with cycle 3. 5. Episodes of vomiting after brushing tongue. Question related to the abdominal mass. He did not complain of this at today's visit. 6. Hiccups following treatment. He will try baclofen 5 mg 3 times a day as needed.  Disposition-Mr. Salley appears stable. He has completed 2 cycles of CHOP/Rituxan. Plan to proceed with cycle 3 today as scheduled.   He developed significant neutropenia following cycle 2. Neulasta will be added beginning with cycle 3. We reviewed potential toxicities associated with Neulasta including bone pain, skin rash, splenic rupture.   He will return for a followup visit and cycle 4 CHOP/Rituxan in 3 weeks. He will contact the office in the interim with any problems. Restaging CT  evaluation is planned after cycle 4.  Plan reviewed with Dr. Truett Perna.  Lonna Cobb ANP/GNP-BC

## 2012-10-19 ENCOUNTER — Ambulatory Visit (HOSPITAL_BASED_OUTPATIENT_CLINIC_OR_DEPARTMENT_OTHER): Payer: BC Managed Care – PPO

## 2012-10-19 VITALS — BP 163/66 | HR 98 | Temp 98.4°F | Resp 20

## 2012-10-19 DIAGNOSIS — C8589 Other specified types of non-Hodgkin lymphoma, extranodal and solid organ sites: Secondary | ICD-10-CM

## 2012-10-19 DIAGNOSIS — Z5189 Encounter for other specified aftercare: Secondary | ICD-10-CM

## 2012-10-19 MED ORDER — PEGFILGRASTIM INJECTION 6 MG/0.6ML
6.0000 mg | Freq: Once | SUBCUTANEOUS | Status: AC
  Administered 2012-10-19: 6 mg via SUBCUTANEOUS
  Filled 2012-10-19: qty 0.6

## 2012-11-01 ENCOUNTER — Other Ambulatory Visit (HOSPITAL_BASED_OUTPATIENT_CLINIC_OR_DEPARTMENT_OTHER): Payer: BC Managed Care – PPO

## 2012-11-01 DIAGNOSIS — C8589 Other specified types of non-Hodgkin lymphoma, extranodal and solid organ sites: Secondary | ICD-10-CM

## 2012-11-01 LAB — CBC WITH DIFFERENTIAL/PLATELET
Basophils Absolute: 0 10*3/uL (ref 0.0–0.1)
Eosinophils Absolute: 0 10*3/uL (ref 0.0–0.5)
HGB: 12.9 g/dL — ABNORMAL LOW (ref 13.0–17.1)
LYMPH%: 4 % — ABNORMAL LOW (ref 14.0–49.0)
MCV: 86.1 fL (ref 79.3–98.0)
MONO%: 8.6 % (ref 0.0–14.0)
NEUT#: 8.3 10*3/uL — ABNORMAL HIGH (ref 1.5–6.5)
NEUT%: 86.8 % — ABNORMAL HIGH (ref 39.0–75.0)
Platelets: 167 10*3/uL (ref 140–400)
RBC: 4.39 10*6/uL (ref 4.20–5.82)

## 2012-11-04 ENCOUNTER — Other Ambulatory Visit: Payer: Self-pay | Admitting: Oncology

## 2012-11-08 ENCOUNTER — Telehealth: Payer: Self-pay | Admitting: Oncology

## 2012-11-08 ENCOUNTER — Telehealth: Payer: Self-pay | Admitting: *Deleted

## 2012-11-08 ENCOUNTER — Ambulatory Visit (HOSPITAL_BASED_OUTPATIENT_CLINIC_OR_DEPARTMENT_OTHER): Payer: BC Managed Care – PPO | Admitting: Oncology

## 2012-11-08 ENCOUNTER — Ambulatory Visit (HOSPITAL_BASED_OUTPATIENT_CLINIC_OR_DEPARTMENT_OTHER): Payer: BC Managed Care – PPO

## 2012-11-08 ENCOUNTER — Other Ambulatory Visit (HOSPITAL_BASED_OUTPATIENT_CLINIC_OR_DEPARTMENT_OTHER): Payer: BC Managed Care – PPO | Admitting: Lab

## 2012-11-08 VITALS — BP 119/60 | HR 73 | Temp 98.4°F | Resp 18

## 2012-11-08 VITALS — BP 121/73 | HR 92 | Temp 97.8°F | Resp 18 | Ht 70.0 in | Wt 223.9 lb

## 2012-11-08 DIAGNOSIS — R61 Generalized hyperhidrosis: Secondary | ICD-10-CM

## 2012-11-08 DIAGNOSIS — C8589 Other specified types of non-Hodgkin lymphoma, extranodal and solid organ sites: Secondary | ICD-10-CM

## 2012-11-08 DIAGNOSIS — R066 Hiccough: Secondary | ICD-10-CM

## 2012-11-08 DIAGNOSIS — Z5112 Encounter for antineoplastic immunotherapy: Secondary | ICD-10-CM

## 2012-11-08 DIAGNOSIS — R110 Nausea: Secondary | ICD-10-CM

## 2012-11-08 LAB — CBC WITH DIFFERENTIAL/PLATELET
BASO%: 0.7 % (ref 0.0–2.0)
Basophils Absolute: 0.1 10*3/uL (ref 0.0–0.1)
EOS%: 0 % (ref 0.0–7.0)
Eosinophils Absolute: 0 10*3/uL (ref 0.0–0.5)
HCT: 37.4 % — ABNORMAL LOW (ref 38.4–49.9)
HGB: 12.5 g/dL — ABNORMAL LOW (ref 13.0–17.1)
LYMPH%: 9.2 % — ABNORMAL LOW (ref 14.0–49.0)
MCH: 28.9 pg (ref 27.2–33.4)
MCHC: 33.4 g/dL (ref 32.0–36.0)
MCV: 86.4 fL (ref 79.3–98.0)
MONO#: 1.1 10*3/uL — ABNORMAL HIGH (ref 0.1–0.9)
MONO%: 12.8 % (ref 0.0–14.0)
NEUT#: 6.9 10*3/uL — ABNORMAL HIGH (ref 1.5–6.5)
NEUT%: 77.3 % — ABNORMAL HIGH (ref 39.0–75.0)
Platelets: 354 10*3/uL (ref 140–400)
RBC: 4.33 10*6/uL (ref 4.20–5.82)
RDW: 14.9 % — ABNORMAL HIGH (ref 11.0–14.6)
WBC: 8.9 10*3/uL (ref 4.0–10.3)
lymph#: 0.8 10*3/uL — ABNORMAL LOW (ref 0.9–3.3)
nRBC: 0 % (ref 0–0)

## 2012-11-08 LAB — COMPREHENSIVE METABOLIC PANEL (CC13)
ALT: 21 U/L (ref 0–55)
AST: 17 U/L (ref 5–34)
BUN: 11.7 mg/dL (ref 7.0–26.0)
Creatinine: 1 mg/dL (ref 0.7–1.3)
Total Bilirubin: 0.22 mg/dL (ref 0.20–1.20)

## 2012-11-08 MED ORDER — VINCRISTINE SULFATE CHEMO INJECTION 1 MG/ML
2.0000 mg | Freq: Once | INTRAVENOUS | Status: AC
  Administered 2012-11-08: 2 mg via INTRAVENOUS
  Filled 2012-11-08: qty 2

## 2012-11-08 MED ORDER — HEPARIN SOD (PORK) LOCK FLUSH 100 UNIT/ML IV SOLN
500.0000 [IU] | Freq: Once | INTRAVENOUS | Status: AC | PRN
  Administered 2012-11-08: 500 [IU]
  Filled 2012-11-08: qty 5

## 2012-11-08 MED ORDER — SODIUM CHLORIDE 0.9 % IJ SOLN
10.0000 mL | INTRAMUSCULAR | Status: DC | PRN
  Administered 2012-11-08: 10 mL
  Filled 2012-11-08: qty 10

## 2012-11-08 MED ORDER — SODIUM CHLORIDE 0.9 % IV SOLN
750.0000 mg/m2 | Freq: Once | INTRAVENOUS | Status: AC
  Administered 2012-11-08: 1680 mg via INTRAVENOUS
  Filled 2012-11-08: qty 84

## 2012-11-08 MED ORDER — PALONOSETRON HCL INJECTION 0.25 MG/5ML
0.2500 mg | Freq: Once | INTRAVENOUS | Status: AC
  Administered 2012-11-08: 0.25 mg via INTRAVENOUS

## 2012-11-08 MED ORDER — SODIUM CHLORIDE 0.9 % IV SOLN
375.0000 mg/m2 | Freq: Once | INTRAVENOUS | Status: AC
  Administered 2012-11-08: 800 mg via INTRAVENOUS
  Filled 2012-11-08: qty 80

## 2012-11-08 MED ORDER — DIPHENHYDRAMINE HCL 25 MG PO CAPS
50.0000 mg | ORAL_CAPSULE | Freq: Once | ORAL | Status: AC
  Administered 2012-11-08: 50 mg via ORAL

## 2012-11-08 MED ORDER — SODIUM CHLORIDE 0.9 % IV SOLN
Freq: Once | INTRAVENOUS | Status: AC
  Administered 2012-11-08: 11:00:00 via INTRAVENOUS

## 2012-11-08 MED ORDER — SODIUM CHLORIDE 0.9 % IV SOLN
150.0000 mg | Freq: Once | INTRAVENOUS | Status: AC
  Administered 2012-11-08: 150 mg via INTRAVENOUS
  Filled 2012-11-08: qty 5

## 2012-11-08 MED ORDER — ACETAMINOPHEN 325 MG PO TABS
650.0000 mg | ORAL_TABLET | Freq: Once | ORAL | Status: AC
  Administered 2012-11-08: 650 mg via ORAL

## 2012-11-08 MED ORDER — DOXORUBICIN HCL CHEMO IV INJECTION 2 MG/ML
50.0000 mg/m2 | Freq: Once | INTRAVENOUS | Status: AC
  Administered 2012-11-08: 112 mg via INTRAVENOUS
  Filled 2012-11-08: qty 56

## 2012-11-08 MED ORDER — DEXAMETHASONE SODIUM PHOSPHATE 10 MG/ML IJ SOLN
10.0000 mg | Freq: Once | INTRAMUSCULAR | Status: AC
  Administered 2012-11-08: 10 mg via INTRAVENOUS

## 2012-11-08 NOTE — Patient Instructions (Addendum)
[facility] Discharge Instructions for Patients Receiving Chemotherapy  Today you received the following chemotherapy agents Adriamycin, Vincristine, Cytoxan and Rituxan  To help prevent nausea and vomiting after your treatment, we encourage you to take your nausea medication as prescribed.    If you develop nausea and vomiting that is not controlled by your nausea medication, call the clinic. If it is after clinic hours your family physician or the after hours number for the clinic or go to the Emergency Department.   BELOW ARE SYMPTOMS THAT SHOULD BE REPORTED IMMEDIATELY:  *FEVER GREATER THAN 100.5 F  *CHILLS WITH OR WITHOUT FEVER  NAUSEA AND VOMITING THAT IS NOT CONTROLLED WITH YOUR NAUSEA MEDICATION  *UNUSUAL SHORTNESS OF BREATH  *UNUSUAL BRUISING OR BLEEDING  TENDERNESS IN MOUTH AND THROAT WITH OR WITHOUT PRESENCE OF ULCERS  *URINARY PROBLEMS  *BOWEL PROBLEMS  UNUSUAL RASH Items with * indicate a potential emergency and should be followed up as soon as possible.   Please let the nurse know about any problems that you may have experienced. Feel free to call the clinic you have any questions or concerns. The clinic phone number is [phone].   I have been informed and understand all the instructions given to me. I know to contact the clinic, my physician, or go to the Emergency Department if any problems should occur. I do not have any questions at this time, but understand that I may call the clinic during office hours   should I have any questions or need assistance in obtaining follow up care.    __________________________________________  _____________  __________ Signature of Patient or Authorized Representative            Date                   Time    __________________________________________ Nurse's Signature    

## 2012-11-08 NOTE — Telephone Encounter (Signed)
Per staff phone call and POF I have schedueld appts.  JMW  

## 2012-11-08 NOTE — Telephone Encounter (Signed)
Gave pt appt for lab,md , injections and chemo for April and MAy 2014

## 2012-11-09 ENCOUNTER — Ambulatory Visit (HOSPITAL_BASED_OUTPATIENT_CLINIC_OR_DEPARTMENT_OTHER): Payer: BC Managed Care – PPO

## 2012-11-09 VITALS — BP 134/70 | HR 74 | Temp 98.1°F

## 2012-11-09 DIAGNOSIS — Z5189 Encounter for other specified aftercare: Secondary | ICD-10-CM

## 2012-11-09 DIAGNOSIS — C8589 Other specified types of non-Hodgkin lymphoma, extranodal and solid organ sites: Secondary | ICD-10-CM

## 2012-11-09 MED ORDER — PEGFILGRASTIM INJECTION 6 MG/0.6ML
6.0000 mg | Freq: Once | SUBCUTANEOUS | Status: AC
  Administered 2012-11-09: 6 mg via SUBCUTANEOUS
  Filled 2012-11-09: qty 0.6

## 2012-11-09 NOTE — Patient Instructions (Addendum)

## 2012-11-09 NOTE — Progress Notes (Signed)
   [facility]    OFFICE PROGRESS NOTE   INTERVAL HISTORY:   He completed a third cycle of chemotherapy on 10/18/2012. He received Neulasta 10/19/2012. He reports less nausea following this cycle. No neuropathy symptoms or bone pain. No fever. He complains of "sweats "each night while in bed for the past 2 weeks. No associated symptoms. He is sleeping under 4 or 5 covers. No sweats during the day. No other complaint. He continues to work.  Objective:  Vital signs in last 24 hours:  Blood pressure 121/73, pulse 92, temperature 97.8 F (36.6 C), temperature source Oral, resp. rate 18, height 5\' 10"  (1.778 m), weight 223 lb 14.4 oz (101.56 kg).    HEENT: No thrush or ulcers Resp: Lungs clear bilaterally Cardio: Regular rate and rhythm GI: No mass, no hepatosplenomegaly Vascular: No leg edema    Portacath/PICC-without erythema  Lab Results:  Lab Results  Component Value Date   WBC 8.9 11/08/2012   HGB 12.5* 11/08/2012   HCT 37.4* 11/08/2012   MCV 86.4 11/08/2012   PLT 354 11/08/2012   ANC 6.9    Medications: I have reviewed the patient's current medications.  Assessment/Plan: 1. Large abdominal mass surrounding small lymph nodes status post CT-guided biopsy on 07/20/2012 with the pathology suggestive of non-Hodgkin's lymphoma. Status post laparoscopic biopsy of the left abdominal mass on 08/21/2011 with final pathology confirming a large B-cell lymphoma, CD20 positive, with both follicular and diffuse patterns. Negative staging bone marrow biopsy. Status post cycle 1 CHOP/Rituxan on 09/06/2012  2. Nausea/vomiting following cycle 1 CHOP/Rituxan. Antiemetic regimen was adjusted with cycle 2 to include Aloxi, Decadron and Emend. He continues to have nausea following chemotherapy. 3. Status post Port-A-Cath placement 08/30/2012. 4. Neutropenia following cycle 2 CHOP/Rituxan. Neulasta was added beginning with cycle 3. 5. Hiccups following treatment. He will try  baclofen 5 mg 3 times a day as needed. 6. "Night sweats "-etiology unclear. I have a low clinical suspicion for an infection or progression of the lymphoma. He will try sleeping without covers to see if the night sweats continue.  Disposition:  The plan is to proceed with cycle 4 of CHOP/Rituxan today. He will undergo a restaging PET scan prior to a followup office visit in 3 weeks.  He will contact us in the interim for new symptoms.   [name], MD  11/09/2012  1:08 PM

## 2012-11-15 ENCOUNTER — Emergency Department (HOSPITAL_COMMUNITY): Payer: BC Managed Care – PPO

## 2012-11-15 ENCOUNTER — Inpatient Hospital Stay (HOSPITAL_COMMUNITY)
Admission: EM | Admit: 2012-11-15 | Discharge: 2012-11-27 | DRG: 574 | Disposition: A | Discharge status: Home Health | Payer: BC Managed Care – PPO | Attending: Internal Medicine | Admitting: Internal Medicine

## 2012-11-15 ENCOUNTER — Encounter (HOSPITAL_COMMUNITY): Payer: Self-pay | Admitting: *Deleted

## 2012-11-15 DIAGNOSIS — E860 Dehydration: Secondary | ICD-10-CM

## 2012-11-15 DIAGNOSIS — C8593 Non-Hodgkin lymphoma, unspecified, intra-abdominal lymph nodes: Secondary | ICD-10-CM | POA: Diagnosis present

## 2012-11-15 DIAGNOSIS — C8589 Other specified types of non-Hodgkin lymphoma, extranodal and solid organ sites: Secondary | ICD-10-CM

## 2012-11-15 DIAGNOSIS — D61810 Antineoplastic chemotherapy induced pancytopenia: Secondary | ICD-10-CM

## 2012-11-15 DIAGNOSIS — T451X5A Adverse effect of antineoplastic and immunosuppressive drugs, initial encounter: Secondary | ICD-10-CM | POA: Diagnosis present

## 2012-11-15 DIAGNOSIS — I951 Orthostatic hypotension: Secondary | ICD-10-CM | POA: Diagnosis present

## 2012-11-15 DIAGNOSIS — D702 Other drug-induced agranulocytosis: Secondary | ICD-10-CM | POA: Diagnosis present

## 2012-11-15 DIAGNOSIS — R509 Fever, unspecified: Secondary | ICD-10-CM | POA: Diagnosis present

## 2012-11-15 DIAGNOSIS — R61 Generalized hyperhidrosis: Secondary | ICD-10-CM | POA: Diagnosis present

## 2012-11-15 DIAGNOSIS — R55 Syncope and collapse: Secondary | ICD-10-CM | POA: Diagnosis present

## 2012-11-15 DIAGNOSIS — R1902 Left upper quadrant abdominal swelling, mass and lump: Secondary | ICD-10-CM | POA: Diagnosis present

## 2012-11-15 DIAGNOSIS — C8583 Other specified types of non-Hodgkin lymphoma, intra-abdominal lymph nodes: Secondary | ICD-10-CM | POA: Diagnosis present

## 2012-11-15 DIAGNOSIS — R197 Diarrhea, unspecified: Secondary | ICD-10-CM | POA: Diagnosis present

## 2012-11-15 DIAGNOSIS — Y849 Medical procedure, unspecified as the cause of abnormal reaction of the patient, or of later complication, without mention of misadventure at the time of the procedure: Secondary | ICD-10-CM | POA: Diagnosis present

## 2012-11-15 DIAGNOSIS — E871 Hypo-osmolality and hyponatremia: Secondary | ICD-10-CM | POA: Diagnosis present

## 2012-11-15 DIAGNOSIS — D709 Neutropenia, unspecified: Principal | ICD-10-CM | POA: Diagnosis present

## 2012-11-15 DIAGNOSIS — T80218A Other infection due to central venous catheter, initial encounter: Secondary | ICD-10-CM | POA: Diagnosis present

## 2012-11-15 DIAGNOSIS — R112 Nausea with vomiting, unspecified: Secondary | ICD-10-CM | POA: Diagnosis present

## 2012-11-15 DIAGNOSIS — R5081 Fever presenting with conditions classified elsewhere: Secondary | ICD-10-CM

## 2012-11-15 DIAGNOSIS — D649 Anemia, unspecified: Secondary | ICD-10-CM | POA: Diagnosis present

## 2012-11-15 LAB — URINALYSIS, ROUTINE W REFLEX MICROSCOPIC
Bilirubin Urine: NEGATIVE
Glucose, UA: NEGATIVE mg/dL
Hgb urine dipstick: NEGATIVE
Ketones, ur: NEGATIVE mg/dL
Leukocytes, UA: NEGATIVE
Nitrite: NEGATIVE
Protein, ur: NEGATIVE mg/dL
Specific Gravity, Urine: 1.028 (ref 1.005–1.030)
Urobilinogen, UA: 0.2 mg/dL (ref 0.0–1.0)
pH: 5.5 (ref 5.0–8.0)

## 2012-11-15 LAB — COMPREHENSIVE METABOLIC PANEL
Albumin: 3.1 g/dL — ABNORMAL LOW (ref 3.5–5.2)
Alkaline Phosphatase: 75 U/L (ref 39–117)
BUN: 17 mg/dL (ref 6–23)
CO2: 30 mEq/L (ref 19–32)
Chloride: 100 mEq/L (ref 96–112)
GFR calc non Af Amer: 81 mL/min — ABNORMAL LOW (ref >90)
Glucose, Bld: 125 mg/dL — ABNORMAL HIGH (ref 70–99)
Potassium: 4.3 mEq/L (ref 3.5–5.1)
Total Bilirubin: 0.3 mg/dL (ref 0.3–1.2)

## 2012-11-15 LAB — CBC WITH DIFFERENTIAL/PLATELET
Basophils Relative: 7 % — ABNORMAL HIGH (ref 0–1)
Eosinophils Relative: 7 % — ABNORMAL HIGH (ref 0–5)
HCT: 33 % — ABNORMAL LOW (ref 39.0–52.0)
Hemoglobin: 11.3 g/dL — ABNORMAL LOW (ref 13.0–17.0)
Lymphs Abs: 0.2 10*3/uL — ABNORMAL LOW (ref 0.7–4.0)
MCH: 29.4 pg (ref 26.0–34.0)
MCV: 85.9 fL (ref 78.0–100.0)
Monocytes Relative: 20 % — ABNORMAL HIGH (ref 3–12)
Neutro Abs: 0.1 10*3/uL — ABNORMAL LOW (ref 1.7–7.7)
RBC: 3.84 MIL/uL — ABNORMAL LOW (ref 4.22–5.81)
WBC Morphology: INCREASED
WBC: 0.4 10*3/uL — CL (ref 4.0–10.5)

## 2012-11-15 MED ORDER — DEXTROSE 5 % IV SOLN
2.0000 g | Freq: Three times a day (TID) | INTRAVENOUS | Status: DC
  Administered 2012-11-15 – 2012-11-19 (×11): 2 g via INTRAVENOUS
  Filled 2012-11-15 (×14): qty 2

## 2012-11-15 MED ORDER — VANCOMYCIN HCL IN DEXTROSE 1-5 GM/200ML-% IV SOLN
1000.0000 mg | Freq: Once | INTRAVENOUS | Status: AC
  Administered 2012-11-16: 1000 mg via INTRAVENOUS
  Filled 2012-11-15: qty 200

## 2012-11-15 MED ORDER — DEXTROSE 5 % IV SOLN
1.0000 g | Freq: Three times a day (TID) | INTRAVENOUS | Status: DC
  Filled 2012-11-15 (×2): qty 1

## 2012-11-15 MED ORDER — SODIUM CHLORIDE 0.9 % IV BOLUS (SEPSIS)
1000.0000 mL | Freq: Once | INTRAVENOUS | Status: AC
  Administered 2012-11-15: 1000 mL via INTRAVENOUS

## 2012-11-15 NOTE — Progress Notes (Addendum)
ANTIBIOTIC CONSULT NOTE - INITIAL  Pharmacy Consult for Cefepime/Vancomycin Indication: neutropenic fever  No Known Allergies  Patient Measurements:   Adjusted Body Weight  Vital Signs: Temp: 99.1 F (37.3 C) (04/03 1958) Temp src: Oral (04/03 1958) BP: 114/63 mmHg (04/03 2113) Pulse Rate: 82 (04/03 2113) Intake/Output from previous day:   Intake/Output from this shift:    Labs:  Recent Labs  11/15/12 2130  WBC 0.4*  HGB 11.3*  PLT 154  CREATININE 1.02   The CrCl is unknown because both a height and weight (above a minimum accepted value) are required for this calculation. No results found for this basename: VANCOTROUGH, VANCOPEAK, VANCORANDOM, GENTTROUGH, GENTPEAK, GENTRANDOM, TOBRATROUGH, TOBRAPEAK, TOBRARND, AMIKACINPEAK, AMIKACINTROU, AMIKACIN,  in the last 72 hours   Microbiology: No results found for this or any previous visit (from the past 720 hour(s)).  Medical History: Past Medical History  Diagnosis Date  . Cancer 2013    Non-Hodgkins Lymphoma  . Other malignant lymphomas, unspecified site, extranodal and solid organ sites 2013  . Hemorrhoid   . H/O hiatal hernia     small seen on CT Scan    Assessment:  11 yom with h/o non-Hodgkin's lymphoma currently undergoing chemotherapy (last chemo 11/08/12, with Neulasta 11/10/12) presented 4/3 with c/o lethargy, N/V, low grade fever.  Pt afebrile here so far, WBC 0.4 (ANC pending). MD ordered Vanc 1gm x 1 and Cefepime per pharmacy for neutropenic fever.  Scr 1.02, CrCl > 100 ml/min.    Goal: vancomycin level 15-20   Plan:   Cefepime 2gm IV q8h  Vancomycin 1gm iv q8hr  Geoffry Paradise, PharmD, BCPS Pager: [phone] 10:23 PM Pharmacy #: [id number]

## 2012-11-15 NOTE — ED Notes (Signed)
History of non hodgkins lymphoma; last chemo last wk; since Saturday has not felt good; felt lethargic and nauseated; having periods of sweating; wife states when pt lays flat gets a gurgling sound followed by vomiting since Sunday; low grade fever off and on since the wkend; has been in bed x 2 days; today walked to his neighbors yard and started sweating; vomited and passed out; unsure how long he was out; pt states he feels better now than this afternoon but still feels swimmy headed and weak; states feels lethargic; alert and oriented in triage; pale

## 2012-11-15 NOTE — ED Notes (Signed)
[id number]<AV> Expected date:<BR> Expected time:<BR> Means of arrival:<BR> Comments:<BR> Hold for Triage 2

## 2012-11-16 ENCOUNTER — Encounter (HOSPITAL_COMMUNITY): Payer: Self-pay

## 2012-11-16 DIAGNOSIS — E871 Hypo-osmolality and hyponatremia: Secondary | ICD-10-CM | POA: Diagnosis present

## 2012-11-16 DIAGNOSIS — R55 Syncope and collapse: Secondary | ICD-10-CM | POA: Diagnosis present

## 2012-11-16 DIAGNOSIS — D702 Other drug-induced agranulocytosis: Secondary | ICD-10-CM | POA: Diagnosis present

## 2012-11-16 LAB — BASIC METABOLIC PANEL
CO2: 26 mEq/L (ref 19–32)
Calcium: 8.1 mg/dL — ABNORMAL LOW (ref 8.4–10.5)
GFR calc Af Amer: >90 mL/min (ref >90)
GFR calc non Af Amer: >90 mL/min (ref >90)
Sodium: 132 mEq/L — ABNORMAL LOW (ref 135–145)

## 2012-11-16 LAB — TROPONIN I
Troponin I: <0.3 ng/mL (ref <0.30)
Troponin I: <0.3 ng/mL (ref <0.30)
Troponin I: <0.3 ng/mL (ref <0.30)

## 2012-11-16 LAB — CBC
Platelets: 120 10*3/uL — ABNORMAL LOW (ref 150–400)
RBC: 3.25 MIL/uL — ABNORMAL LOW (ref 4.22–5.81)
WBC: 0.3 10*3/uL — CL (ref 4.0–10.5)

## 2012-11-16 MED ORDER — ACETAMINOPHEN 650 MG RE SUPP: 650.0000 mg | Freq: Four times a day (QID) | RECTAL | Status: DC | PRN

## 2012-11-16 MED ORDER — ENOXAPARIN SODIUM 40 MG/0.4ML ~~LOC~~ SOLN
40.0000 mg | SUBCUTANEOUS | Status: DC
  Filled 2012-11-16: qty 0.4

## 2012-11-16 MED ORDER — MORPHINE SULFATE 2 MG/ML IJ SOLN: 2.0000 mg | INTRAMUSCULAR | Status: DC | PRN

## 2012-11-16 MED ORDER — SODIUM CHLORIDE 0.9 % IJ SOLN
3.0000 mL | Freq: Two times a day (BID) | INTRAMUSCULAR | Status: DC
  Administered 2012-11-16 – 2012-11-22 (×5): 3 mL via INTRAVENOUS

## 2012-11-16 MED ORDER — LIDOCAINE-PRILOCAINE 2.5-2.5 % EX CREA
TOPICAL_CREAM | CUTANEOUS | Status: DC | PRN
  Filled 2012-11-16: qty 5

## 2012-11-16 MED ORDER — PREDNISONE 50 MG PO TABS: 100.0000 mg | ORAL_TABLET | Freq: Every day | ORAL | Status: DC

## 2012-11-16 MED ORDER — SODIUM CHLORIDE 0.9 % IV SOLN: 250.0000 mL | INTRAVENOUS | Status: DC | PRN

## 2012-11-16 MED ORDER — SODIUM CHLORIDE 0.9 % IJ SOLN
3.0000 mL | Freq: Two times a day (BID) | INTRAMUSCULAR | Status: DC
  Administered 2012-11-16 – 2012-11-26 (×5): 3 mL via INTRAVENOUS

## 2012-11-16 MED ORDER — SODIUM CHLORIDE 0.9 % IJ SOLN: 3.0000 mL | INTRAMUSCULAR | Status: DC | PRN

## 2012-11-16 MED ORDER — ENOXAPARIN SODIUM 60 MG/0.6ML ~~LOC~~ SOLN
50.0000 mg | SUBCUTANEOUS | Status: DC
  Administered 2012-11-16: 50 mg via SUBCUTANEOUS
  Administered 2012-11-17: 11:00:00 via SUBCUTANEOUS
  Administered 2012-11-18 – 2012-11-27 (×9): 50 mg via SUBCUTANEOUS
  Filled 2012-11-16 (×13): qty 0.6

## 2012-11-16 MED ORDER — SODIUM CHLORIDE 0.9 % IV SOLN
INTRAVENOUS | Status: DC
  Administered 2012-11-16 – 2012-11-22 (×8): via INTRAVENOUS

## 2012-11-16 MED ORDER — HYDROCODONE-ACETAMINOPHEN 5-325 MG PO TABS
1.0000 | ORAL_TABLET | ORAL | Status: DC | PRN
Start: 2012-11-16 — End: 2012-11-27
  Administered 2012-11-16: 2 via ORAL
  Filled 2012-11-16: qty 2

## 2012-11-16 MED ORDER — VANCOMYCIN HCL IN DEXTROSE 1-5 GM/200ML-% IV SOLN
1000.0000 mg | Freq: Three times a day (TID) | INTRAVENOUS | Status: DC
  Administered 2012-11-16 – 2012-11-18 (×6): 1000 mg via INTRAVENOUS
  Filled 2012-11-16 (×9): qty 200

## 2012-11-16 MED ORDER — ACETAMINOPHEN 325 MG PO TABS
650.0000 mg | ORAL_TABLET | Freq: Four times a day (QID) | ORAL | Status: DC | PRN
  Administered 2012-11-21 – 2012-11-24 (×4): 650 mg via ORAL
  Filled 2012-11-16 (×6): qty 2

## 2012-11-16 MED ORDER — PROCHLORPERAZINE MALEATE 10 MG PO TABS
10.0000 mg | ORAL_TABLET | Freq: Four times a day (QID) | ORAL | Status: DC | PRN
  Filled 2012-11-16 (×2): qty 1

## 2012-11-16 MED ORDER — PROCHLORPERAZINE EDISYLATE 5 MG/ML IJ SOLN
10.0000 mg | Freq: Four times a day (QID) | INTRAMUSCULAR | Status: DC | PRN
  Administered 2012-11-16 – 2012-11-25 (×3): 10 mg via INTRAVENOUS
  Filled 2012-11-16 (×4): qty 2

## 2012-11-16 MED ORDER — SODIUM CHLORIDE 0.9 % IJ SOLN
10.0000 mL | INTRAMUSCULAR | Status: DC | PRN
  Administered 2012-11-16 – 2012-11-22 (×3): 10 mL

## 2012-11-16 MED ORDER — LORAZEPAM 1 MG PO TABS
1.0000 mg | ORAL_TABLET | Freq: Four times a day (QID) | ORAL | Status: DC | PRN
  Filled 2012-11-16: qty 1

## 2012-11-16 MED ORDER — BACLOFEN 5 MG HALF TABLET
5.0000 mg | ORAL_TABLET | Freq: Three times a day (TID) | ORAL | Status: DC | PRN
  Filled 2012-11-16: qty 1

## 2012-11-16 NOTE — Progress Notes (Signed)
Pharmacy: Lovenox for DVT px  Patients weight = 101.6 kg Patients CrCl > 100 mlmin   Lovenox dose increased to 50 mg Q24h (0.5 mg/kg) For BMI > 30.   BorgerdingLoma Messing PharmD Pager #: [phone] 10:35 AM 11/16/2012

## 2012-11-16 NOTE — ED Provider Notes (Signed)
Medical screening examination/treatment/procedure(s) were conducted as a shared visit with non-physician practitioner(s) and myself.  I personally evaluated the patient during the encounter  Adam Alvarado is a 57 y.o. male hx of non hodgkin's lymphoma on chemo here with weakness and night sweats and cough. He has a port of chemo. Subjective fever at home, low grade temp here. Not hypotensive. Labs showed neutropenia. CXR, UA nl. Lactate and cultures sent. He was given cefepime and vanc for neutropenic fever. Placed on reverse isolation and admitted to hospitalist. Oncology was aware.    Richardean Canal, MD 11/16/12 1501

## 2012-11-16 NOTE — H&P (Signed)
[facility]                                                                                    Patient Demographics  Adam Alvarado, is a 57 y.o. male  CSN: [id number]  MRN: [id number]  DOB - 1956-06-13  Admit Date - 11/15/2012  Outpatient Primary MD for the patient is [name], MD   With History of -  Past Medical History  Diagnosis Date  . Cancer 2013    Non-Hodgkins Lymphoma  . Other malignant lymphomas, unspecified site, extranodal and solid organ sites 2013  . Hemorrhoid   . H/O hiatal hernia     small seen on CT Scan      Past Surgical History  Procedure Laterality Date  . Vasectomy    . Vasectomy  1998  . No past surgeries    . Laparoscopy  08/20/2012    Procedure: LAPAROSCOPY DIAGNOSTIC;  Surgeon: Ernestene Mention, MD;  Location: [facility] OR;  Service: General;  Laterality: N/A;  . Esophageal biopsy  08/20/2012    Procedure: BIOPSY;  Surgeon: Ernestene Mention, MD;  Location: [facility] OR;  Service: General;  Laterality: N/A;  Laparoscopic biopsy of abdominal mesenteric mass    in for   Chief Complaint  Patient presents with  . Loss of Consciousness     HPI  Adam Alvarado  is a 56 y.o. male, with past medical history significant for lymphoma diagnosed last Thanksgiving presenting today with a few days history of" feeling bad". Patient had a nonproductive cough around 3 episodes of nausea and vomiting a day since his last chemotherapy last Thursday. He reports chills and night sweats for the last few days and he feels weak with his head swimming. Today he went to visit his neighbor and collapsed for around 2 minutes after an episode of nausea and vomiting. Reports feverishness as well. In the emergency room he was noted to have low-grade fever, his chest x-ray was negative, Dr. Juliette Alcide was consulted by the emergency room doctor and advised treating him as if febrile neutropenia.    Review of Systems    In addition to the HPI above,   No Headache, No changes  with Vision or hearing, No problems swallowing food or Liquids, No Chest pain,  or Shortness of Breath, No Abdominal pain,  Bowel movements are regular, No Blood in stool or Urine, No dysuria, No new skin rashes or bruises, No new joints pains-aches,  No recent weight gain or loss, No polyuria, polydypsia or polyphagia, No significant Mental Stressors.  A full 10 point Review of Systems was done, except as stated above, all other Review of Systems were negative.   Social History History  Substance Use Topics  . Smoking status: Never Smoker   . Smokeless tobacco: Never Used  . Alcohol Use: Yes     Comment: on occassion     Family History Family History  Problem Relation Age of Onset  . Cancer Mother     Breast     Prior to Admission medications   Medication Sig Start Date End Date Taking? Authorizing Provider  baclofen (LIORESAL) 10 MG tablet Take 0.5 tablets (  5 mg total) by mouth 3 (three) times daily as needed (FOR HICCUPS). 10/18/12  Yes Rana Snare, NP  lidocaine-prilocaine (EMLA) cream Apply topically as needed. Apply to [facility] 2 hours prior to stick and cover with plastic wrap to numb site 08/31/12  Yes Ladene Artist, MD  LORazepam (ATIVAN) 1 MG tablet Place 1 tablet (1 mg total) under the tongue every 6 (six) hours as needed (nausea). 09/27/12  Yes Ladene Artist, MD  predniSONE (DELTASONE) 20 MG tablet Take 5 tablets (100 mg total) by mouth daily. X 5 days Begin on day 1 of each chemo cycle 08/31/12  Yes Ladene Artist, MD  prochlorperazine (COMPAZINE) 10 MG tablet Take 1 tablet (10 mg total) by mouth every 6 (six) hours as needed (nausea). 08/31/12  Yes Ladene Artist, MD    No Known Allergies  Physical Exam  Vitals  Blood pressure 114/63, pulse 82, temperature 99.1 F (37.3 C), temperature source Oral, resp. rate 18, height 5' 10.08" (1.78 m), weight 101.6 kg (223 lb 15.8 oz), SpO2 100.00%.   1. General middle-aged white American male lying in bed in no  acute distress, very pleasant  2. Normal affect and insight, Not Suicidal or Homicidal, Awake Alert, Oriented X 3.  3. No F.N deficits, ALL C.Nerves Intact, Strength 5/5 all 4 extremities, Sensation intact all 4 extremities, Plantars down going.  4. Ears and Eyes appear Normal, Conjunctivae clear, PERRLA. Moist Oral Mucosa.  5. Supple Neck, No JVD, No cervical lymphadenopathy appriciated, No Carotid Bruits.  6. Symmetrical Chest wall movement, Good air movement bilaterally, CTAB.  7. RRR, No Gallops, Rubs or Murmurs, No Parasternal Heave.  8. Positive Bowel Sounds, Abdomen Soft, Non tender, No organomegaly appriciated,No rebound -guarding or rigidity.  9.  No Cyanosis, Normal Skin Turgor, No Skin Rash or Bruise.  10. Good muscle tone,  joints appear normal , no effusions, Normal ROM.  11. No Palpable Lymph Nodes in Neck or Axillae    Data Review  CBC  Recent Labs Lab 11/15/12 2130  WBC 0.4*  HGB 11.3*  HCT 33.0*  PLT 154  MCV 85.9  MCH 29.4  MCHC 34.2  RDW 14.4  LYMPHSABS 0.2*  MONOABS 0.1  EOSABS 0.0  BASOSABS 0.0   ------------------------------------------------------------------------------------------------------------------  Chemistries   Recent Labs Lab 11/15/12 2130  NA 137  K 4.3  CL 100  CO2 30  GLUCOSE 125*  BUN 17  CREATININE 1.02  CALCIUM 8.5  AST 20  ALT 42  ALKPHOS 75  BILITOT 0.3   ------------------------------------------------------------------------------------------------------------------ estimated creatinine clearance is 97.9 ml/min (by C-G formula based on Cr of 1.02). ------------------------------------------------------------------------------------------------------------------ No results found for this basename: TSH, T4TOTAL, FREET3, T3FREE, THYROIDAB,  in the last 72 hours     ---------------------------------------------------------------------------------------------------------------  Urinalysis    Component  Value Date/Time   COLORURINE YELLOW 11/15/2012 2104   APPEARANCEUR HAZY* 11/15/2012 2104   LABSPEC 1.028 11/15/2012 2104   PHURINE 5.5 11/15/2012 2104   GLUCOSEU NEGATIVE 11/15/2012 2104   HGBUR NEGATIVE 11/15/2012 2104   BILIRUBINUR NEGATIVE 11/15/2012 2104   KETONESUR NEGATIVE 11/15/2012 2104   PROTEINUR NEGATIVE 11/15/2012 2104   UROBILINOGEN 0.2 11/15/2012 2104   NITRITE NEGATIVE 11/15/2012 2104   LEUKOCYTESUR NEGATIVE 11/15/2012 2104    ----------------------------------------------------------------------------------------------------------------     Imaging results:   Dg Chest 2 View  11/15/2012  *RADIOLOGY REPORT*  Clinical Data: Loss of consciousness.  Fever.  Lymphoma.  CHEST - 2 VIEW  Comparison: 12/01/2004  Findings: Lungs are clear.  Negative for pneumonia or effusion. Vascularity normal.  Port-A-Cath tip in the SVC. Negative for adenopathy or mass.  IMPRESSION: No active cardiopulmonary abnormality.  Negative for pneumonia.   Original Report Authenticated By: Janeece Riggers, M.D.    Ct Head Wo Contrast  11/15/2012  *RADIOLOGY REPORT*  Clinical Data: History of lymphoma.  Syncope.  CT HEAD WITHOUT CONTRAST  Technique:  Contiguous axial images were obtained from the base of the skull through the vertex without contrast.  Comparison: None.  Findings: No mass effect, midline shift, or acute intracranial hemorrhage.  Dilated perivascular space in the lower left basal ganglia.  Brain parenchyma and ventricles system are within normal limits.  Mastoid air cells are clear.  Visualized paranasal sinuses are clear.  IMPRESSION: No acute intracranial pathology.   Original Report Authenticated By: Jolaine Click, M.D.       Assessment & Plan  1. febrile neutropenia: Discussed with Dr. Juliette Alcide who advised treating him as febrile neutropenia with vancomycin and cefepime, patient is usually followed by Dr. Truett Perna.  2. Lymphoma: Diagnosed last Thanksgiving and status post 4 cycles of chemotherapy last 1 on  Thursday  3. Syncopal episode: CT of the head is negative, place him telemetry and rule out MI or other cardiac event   DVT Prophylaxis Lovenox  AM Labs Ordered, also please review Full Orders  Family Communication: Admission, patients condition and plan of care including tests being ordered have been discussed with the patient and wife who indicate understanding and agree with the plan and Code Status.  Code Status full  Disposition Plan: Home  Time spent in minutes : 45 minutes  Condition GUARDED

## 2012-11-16 NOTE — ED Provider Notes (Signed)
History     CSN: [id number]  Arrival date & time 11/15/12  1939   First MD Initiated Contact with Patient 11/15/12 2038      Chief Complaint  Patient presents with  . Loss of Consciousness    (Consider location/radiation/quality/duration/timing/severity/associated sxs/prior treatment) HPI Patient presents to the emergency department with syncope and weakness.  Patient, states, that is currently being treated for non-Hodgkin's lymphoma.  Patient, states his last treatment was Thursday and then starting on Saturday, started feeling weakness, that progressed over the next 4-5 days.  Patient, states he had some fevers noted subjectively at home.  Patient denies chest pain diarrhea shortness of breath, headache, blurred vision numbness, back pain, cough, or abdominal pain.  Patient, states, today, that he went out to get his trash can and saw his neighbor in in the yard and went to speak with him.  He states he had a lightheaded, dizzy, diaphoretic vomited and then had a syncopal episode.  Patient, states, that his white count has been low. Past Medical History  Diagnosis Date  . Cancer 2013    Non-Hodgkins Lymphoma  . Other malignant lymphomas, unspecified site, extranodal and solid organ sites 2013  . Hemorrhoid   . H/O hiatal hernia     small seen on CT Scan    Past Surgical History  Procedure Laterality Date  . Vasectomy    . Vasectomy  1998  . No past surgeries    . Laparoscopy  08/20/2012    Procedure: LAPAROSCOPY DIAGNOSTIC;  Surgeon: Ernestene Mention, MD;  Location[facility] OR;  Service: General;  Laterality: N/A;  . Esophageal biopsy  08/20/2012    Procedure: BIOPSY;  Surgeon: Ernestene Mention, MD;  Location: [facility] OR;  Service: General;  Laterality: N/A;  Laparoscopic biopsy of abdominal mesenteric mass    Family History  Problem Relation Age of Onset  . Cancer Mother     Breast    History  Substance Use Topics  . Smoking status: Never Smoker   . Smokeless tobacco: Never Used   . Alcohol Use: Yes     Comment: on occassion      Review of Systems All other systems negative except as documented in the HPI. All pertinent positives and negatives as reviewed in the HPI. Allergies  Review of patient's allergies indicates no known allergies.  Home Medications   Current Outpatient Rx  Name  Route  Sig  Dispense  Refill  . baclofen (LIORESAL) 10 MG tablet   Oral   Take 0.5 tablets (5 mg total) by mouth 3 (three) times daily as needed (FOR HICCUPS).   30 each   0   . lidocaine-prilocaine (EMLA) cream   Topical   Apply topically as needed. Apply to [facility] 2 hours prior to stick and cover with plastic wrap to numb site   30 g   prn   . LORazepam (ATIVAN) 1 MG tablet   Sublingual   Place 1 tablet (1 mg total) under the tongue every 6 (six) hours as needed (nausea).   30 tablet   1   . predniSONE (DELTASONE) 20 MG tablet   Oral   Take 5 tablets (100 mg total) by mouth daily. X 5 days Begin on day 1 of each chemo cycle   50 tablet   2   . prochlorperazine (COMPAZINE) 10 MG tablet   Oral   Take 1 tablet (10 mg total) by mouth every 6 (six) hours as needed (nausea).   60  tablet   2     BP 114/63  Pulse 82  Temp(Src) 99.1 F (37.3 C) (Oral)  Resp 18  Ht 5' 10.08" (1.78 m)  Wt 223 lb 15.8 oz (101.6 kg)  BMI 32.07 kg/m2  SpO2 100%  Physical Exam  Nursing note and vitals reviewed. Constitutional: He is oriented to person, place, and time. He appears well-developed and well-nourished. No distress.  HENT:  Head: Normocephalic and atraumatic. No trismus in the jaw.  Mouth/Throat: Uvula is midline. Mucous membranes are dry. No edematous. No oropharyngeal exudate, posterior oropharyngeal edema or posterior oropharyngeal erythema.  Eyes: Pupils are equal, round, and reactive to light.  Neck: Normal range of motion. Neck supple.  Cardiovascular: Normal rate, regular rhythm and intact distal pulses.  Exam reveals no gallop and no friction rub.   No  murmur heard. Pulmonary/Chest: Effort normal and breath sounds normal.  Abdominal: Soft. Bowel sounds are normal. He exhibits no distension. There is no tenderness. There is no guarding.  Neurological: He is alert and oriented to person, place, and time. He exhibits normal muscle tone. Coordination normal.  Skin: Skin is warm and dry. No rash noted. No erythema.    ED Course  Procedures (including critical care time)  Labs Reviewed  CBC WITH DIFFERENTIAL - Abnormal; Notable for the following:    WBC 0.4 (*)    RBC 3.84 (*)    Hemoglobin 11.3 (*)    HCT 33.0 (*)    Neutrophils Relative 20 (*)    Monocytes Relative 20 (*)    Eosinophils Relative 7 (*)    Basophils Relative 7 (*)    Neutro Abs 0.1 (*)    Lymphs Abs 0.2 (*)    All other components within normal limits  COMPREHENSIVE METABOLIC PANEL - Abnormal; Notable for the following:    Glucose, Bld 125 (*)    Albumin 3.1 (*)    GFR calc non Af Amer 81 (*)    All other components within normal limits  URINALYSIS, ROUTINE W REFLEX MICROSCOPIC - Abnormal; Notable for the following:    APPearance HAZY (*)    All other components within normal limits  CULTURE, BLOOD (ROUTINE X 2)  CULTURE, BLOOD (ROUTINE X 2)  LACTIC ACID, PLASMA  PATHOLOGIST SMEAR REVIEW   Dg Chest 2 View  11/15/2012  *RADIOLOGY REPORT*  Clinical Data: Loss of consciousness.  Fever.  Lymphoma.  CHEST - 2 VIEW  Comparison: 12/01/2004  Findings: Lungs are clear.  Negative for pneumonia or effusion. Vascularity normal.  Port-A-Cath tip in the SVC. Negative for adenopathy or mass.  IMPRESSION: No active cardiopulmonary abnormality.  Negative for pneumonia.   Original Report Authenticated By: Janeece Riggers, M.D.    Ct Head Wo Contrast  11/15/2012  *RADIOLOGY REPORT*  Clinical Data: History of lymphoma.  Syncope.  CT HEAD WITHOUT CONTRAST  Technique:  Contiguous axial images were obtained from the base of the skull through the vertex without contrast.  Comparison: None.   Findings: No mass effect, midline shift, or acute intracranial hemorrhage.  Dilated perivascular space in the lower left basal ganglia.  Brain parenchyma and ventricles system are within normal limits.  Mastoid air cells are clear.  Visualized paranasal sinuses are clear.  IMPRESSION: No acute intracranial pathology.   Original Report Authenticated By: Jolaine Click, M.D.      1. Neutropenia   2. Dehydration     Patient be admitted to the hospital by [facility].  I spoke with who is on call  for oncology and they agreed that the patient most likely needs admission.  Patient is stable here in the emergency department.  MDM  MDM Reviewed: vitals and nursing note Interpretation: labs, ECG and x-ray Consults: admitting MD   Date: 11/16/2012  Rate: 75  Rhythm: normal sinus rhythm  QRS Axis: normal  Intervals: normal  ST/T Wave abnormalities: nonspecific T wave changes  Conduction Disutrbances:first-degree A-V block   Narrative Interpretation:   Old EKG Reviewed: none available           Carlyle Dolly, PA-C 11/16/12 [phone]

## 2012-11-16 NOTE — Progress Notes (Signed)
[facility] PROGRESS NOTE  Adam Alvarado ZOX:[id number] DOB: 15-Jun-1956 DOA: 11/15/2012 PCP: [name], MD  Assessment/Plan: Syncope -Suspect combination of vagal reaction and volume depletion -Check orthostatics -Cycle troponins -EKG is reassuring -Echocardiogram 08/31/2012--EF 60-65%, no WMA -Continue IV fluids -CT brain is negative Neutropenia -Tmax 99.8 -ANC approx 80 on 11/15/12--(20% bands) -Patient received Neulasta 11/09/2012 -Follow blood cultures -No pyuria but the patient is neutropenic, no dysuria -Chest x-ray is clear in the face of neutropenia B-cell lymphoma -Dr. Truett Perna has been notified of patient's hospitalization -Fourth cycle CHOP/Rituxan on 11/08/12 Night sweats - "B"-type symptoms vs occult infection    Family Communication:   wife at beside Disposition Plan:   Home when medically stable Total time spent 60 min    Antibiotics:  Vancomycin/cefepime 11/16/2012>>>      Procedures/Studies: Dg Chest 2 View  11/15/2012  *RADIOLOGY REPORT*  Clinical Data: Loss of consciousness.  Fever.  Lymphoma.  CHEST - 2 VIEW  Comparison: 12/01/2004  Findings: Lungs are clear.  Negative for pneumonia or effusion. Vascularity normal.  Port-A-Cath tip in the SVC. Negative for adenopathy or mass.  IMPRESSION: No active cardiopulmonary abnormality.  Negative for pneumonia.   Original Report Authenticated By: Janeece Riggers, M.D.    Ct Head Wo Contrast  11/15/2012  *RADIOLOGY REPORT*  Clinical Data: History of lymphoma.  Syncope.  CT HEAD WITHOUT CONTRAST  Technique:  Contiguous axial images were obtained from the base of the skull through the vertex without contrast.  Comparison: None.  Findings: No mass effect, midline shift, or acute intracranial hemorrhage.  Dilated perivascular space in the lower left basal ganglia.  Brain parenchyma and ventricles system are within normal limits.  Mastoid air cells are clear.  Visualized paranasal sinuses are clear.  IMPRESSION: No  acute intracranial pathology.   Original Report Authenticated By: Jolaine Click, M.D.          Subjective: Patient is feeling better than yesterday. Denies any fevers, chills, chest pain, shortness of breath. Has a nonproductive cough. Denies any hemoptysis. Had one episode of emesis in the emergency department. It breakfast this morning without difficulty. No abdominal pain or dysuria. No dizziness currently.  Objective: Filed Vitals:   11/15/12 2113 11/16/12 0107 11/16/12 0142 11/16/12 0609  BP: 114/63 138/67 145/81 145/81  Pulse: 82 78 77 89  Temp:  99.4 F (37.4 C) 99.8 F (37.7 C) 99.8 F (37.7 C)  TempSrc:  Oral Oral Oral  Resp:  21 18 18   Height: 5' 10.08" (1.78 m)     Weight: 101.6 kg (223 lb 15.8 oz)     SpO2:  95% 97% 96%    Intake/Output Summary (Last 24 hours) at 11/16/12 0955 Last data filed at 11/16/12 0737  Gross per 24 hour  Intake    240 ml  Output      0 ml  Net    240 ml   Weight change:  Exam:   General:  Pt is alert, follows commands appropriately, not in acute distress  HEENT: No icterus, No thrush, No neck mass, [place]/AT  Cardiovascular: RRR, S1/S2, no rubs, no gallops  Respiratory: CTA bilaterally, no wheezing, no crackles, no rhonchi  Abdomen: Soft/+BS, non tender, non distended, no guarding  Extremities: No edema, No lymphangitis, No petechiae, No rashes, no synovitis  Data Reviewed: Basic Metabolic Panel:  Recent Labs Lab 11/15/12 2130 11/16/12 0450  NA 137 132*  K 4.3 4.1  CL 100 99  CO2 30 26  GLUCOSE 125* 114*  BUN 17  14  CREATININE 1.02 0.84  CALCIUM 8.5 8.1*   Liver Function Tests:  Recent Labs Lab 11/15/12 2130  AST 20  ALT 42  ALKPHOS 75  BILITOT 0.3  PROT 6.0  ALBUMIN 3.1*   No results found for this basename: LIPASE, AMYLASE,  in the last 168 hours No results found for this basename: AMMONIA,  in the last 168 hours CBC:  Recent Labs Lab 11/15/12 2130 11/16/12 0450  WBC 0.4* 0.3*  NEUTROABS 0.1*  --    HGB 11.3* 9.8*  HCT 33.0* 27.5*  MCV 85.9 84.6  PLT 154 120*   Cardiac Enzymes: No results found for this basename: CKTOTAL, CKMB, CKMBINDEX, TROPONINI,  in the last 168 hours BNP: No components found with this basename: POCBNP,  CBG: No results found for this basename: GLUCAP,  in the last 168 hours  No results found for this or any previous visit (from the past 240 hour(s)).   Scheduled Meds: . ceFEPime (MAXIPIME) IV  2 g Intravenous Q8H  . enoxaparin (LOVENOX) injection  40 mg Subcutaneous Q24H  . sodium chloride  3 mL Intravenous Q12H  . sodium chloride  3 mL Intravenous Q12H  . vancomycin  1,000 mg Intravenous Q8H   Continuous Infusions: . sodium chloride 75 mL/hr at 11/16/12 0147     [name], DO  [facility] Pager [phone]  If 7PM-7AM, please contact night-coverage [email or web address] Password [email or web address] 11/16/2012, 9:55 AM   LOS: 1 day

## 2012-11-16 NOTE — Progress Notes (Signed)
IP PROGRESS NOTE  Subjective:   He is known to me with a history of non-Hodgkin. He completed a fourth treatment with CHOP-rituximab on 11/08/2012. He received Neulasta on 11/09/2012. He reports intermittent nausea and vomiting beginning on 11/12/2012. On 11/14/2012 he felt dizzy while on the toilet. He had a syncope event while talking to his neighbor on 11/15/2012 and presented to the emergency room. He was noted to have a low-grade fever and severe neutropenia upon presentation to the emergency room. He was admitted for further evaluation.  Adam Alvarado had an episode of emesis earlier today. He reports constipation. No diarrhea. No dysuria or tenderness at the Port-A-Cath site. Nonproductive cough.    Objective: Vital signs in last 24 hours: Blood pressure 143/79, pulse 78, temperature 100.1 F (37.8 C), temperature source Oral, resp. rate 16, height 5' 10.08" (1.78 m), weight 223 lb 15.8 oz (101.6 kg), SpO2 96.00%.  Intake/Output from previous day:    Physical Exam:  HEENT: No thrush or ulcers Lungs: Clear bilaterally Cardiac: Regular rate and rhythm Abdomen: Nontender, no hepatosplenomegaly, no mass, bowel sounds are present Extremities: No leg edema   Portacath/PICC-with faint erythema along the Port-A-Cath tract and surrounding the port. Nontender. No fluctuance. Lab Results:  Recent Labs  11/15/12 2130 11/16/12 0450  WBC 0.4* 0.3*  HGB 11.3* 9.8*  HCT 33.0* 27.5*  PLT 154 120*    BMET  Recent Labs  11/15/12 2130 11/16/12 0450  NA 137 132*  K 4.3 4.1  CL 100 99  CO2 30 26  GLUCOSE 125* 114*  BUN 17 14  CREATININE 1.02 0.84  CALCIUM 8.5 8.1*    Studies/Results: Dg Chest 2 View  11/15/2012  *RADIOLOGY REPORT*  Clinical Data: Loss of consciousness.  Fever.  Lymphoma.  CHEST - 2 VIEW  Comparison: 12/01/2004  Findings: Lungs are clear.  Negative for pneumonia or effusion. Vascularity normal.  Port-A-Cath tip in the SVC. Negative for adenopathy or mass.   IMPRESSION: No active cardiopulmonary abnormality.  Negative for pneumonia.   Original Report Authenticated By: Janeece Riggers, M.D.    Ct Head Wo Contrast  11/15/2012  *RADIOLOGY REPORT*  Clinical Data: History of lymphoma.  Syncope.  CT HEAD WITHOUT CONTRAST  Technique:  Contiguous axial images were obtained from the base of the skull through the vertex without contrast.  Comparison: None.  Findings: No mass effect, midline shift, or acute intracranial hemorrhage.  Dilated perivascular space in the lower left basal ganglia.  Brain parenchyma and ventricles system are within normal limits.  Mastoid air cells are clear.  Visualized paranasal sinuses are clear.  IMPRESSION: No acute intracranial pathology.   Original Report Authenticated By: Jolaine Click, M.D.     Medications: I have reviewed the patient's current medications.  Assessment/Plan:  1. Large abdominal mass and surrounding small lymph nodes status post CT-guided biopsy on 07/20/2012 with the pathology suggestive of non-Hodgkin's lymphoma. Status post laparoscopic biopsy of the left abdominal mass on 08/21/2011 with final pathology confirming a large B-cell lymphoma, CD20 positive, with both follicular and diffuse patterns. Negative staging bone marrow biopsy. Status post cycle 1 CHOP/Rituxan on 09/06/2012 , he completed cycle 4 on 11/08/2012 2. Nausea/vomiting-likely secondary chemotherapy despite Aloxi/emend/Decadron 3. Status post Port-A-Cath placement 08/30/2012. 4. Neutropenia following cycle 2 CHOP/Rituxan. Neulasta was added beginning with cycle 3. 5. Syncope event 11/15/2012-likely related to dehydration       6.   "Night sweats "-etiology unclear. He reports sweats when sleeping for the past 2 weeks.  7.    pancytopenia secondary to chemotherapy, he received Neulasta on 11/09/2012       8.    Fever in the setting of neutropenia-maintained on broad-spectrum antibiotics without an apparent source for infection       9.    ? Mild  erythema surrounding the right upper chest Port-A-Cath   He is admitted with febrile neutropenia and a syncope event following cycle 4 CHOP-rituximab. He is now at day 9 following chemotherapy and the severe neutropenia persists. The syncope of that was most likely related to dehydration in the setting of nausea/vomiting following chemotherapy.  Recommendations:  1. Continue broad-spectrum antibiotics and follow blood cultures 2. Continue intravenous hydration and antiemetics 3. Access Port-A-Cath for intravenous treatments and blood draws 4. Follow for erythema/tenderness at the Port-A-Cath site, I have a low clinical suspicion for an infected catheter 5. Daily CBC  I appreciate the care from the hospitalist service. Oncology will continue to follow him daily.    LOS: 1 day   Adam Alvarado, Adam Alvarado  11/16/2012, 4:33 PM

## 2012-11-17 DIAGNOSIS — R109 Unspecified abdominal pain: Secondary | ICD-10-CM

## 2012-11-17 DIAGNOSIS — D61818 Other pancytopenia: Secondary | ICD-10-CM

## 2012-11-17 DIAGNOSIS — E860 Dehydration: Secondary | ICD-10-CM

## 2012-11-17 LAB — CBC WITH DIFFERENTIAL/PLATELET
Basophils Relative: 4 % — ABNORMAL HIGH (ref 0–1)
Eosinophils Absolute: 0.1 10*3/uL (ref 0.0–0.7)
Eosinophils Relative: 5 % (ref 0–5)
HCT: 28.3 % — ABNORMAL LOW (ref 39.0–52.0)
Hemoglobin: 9.8 g/dL — ABNORMAL LOW (ref 13.0–17.0)
MCH: 29.5 pg (ref 26.0–34.0)
MCHC: 34.6 g/dL (ref 30.0–36.0)
Monocytes Absolute: 0.3 10*3/uL (ref 0.1–1.0)
Neutro Abs: 0.3 10*3/uL — ABNORMAL LOW (ref 1.7–7.7)
RBC: 3.32 MIL/uL — ABNORMAL LOW (ref 4.22–5.81)

## 2012-11-17 LAB — BASIC METABOLIC PANEL
CO2: 28 mEq/L (ref 19–32)
Calcium: 8.3 mg/dL — ABNORMAL LOW (ref 8.4–10.5)
Chloride: 100 mEq/L (ref 96–112)
Glucose, Bld: 106 mg/dL — ABNORMAL HIGH (ref 70–99)
Sodium: 135 mEq/L (ref 135–145)

## 2012-11-17 LAB — MRSA PCR SCREENING: MRSA by PCR: NEGATIVE

## 2012-11-17 NOTE — Consult Note (Signed)
[facility] INPATIENT PROGRESS NOTE  Name: Adam Alvarado      MRN: [id number]    Location: 1517/1517-01  Date: 11/17/2012 Time:10:13 AM   Subjective: Interval History:Winton L Debois reported feeling better.  He still has low grade fever.  He has had nausea/vomiting but not since yesterday.  He has loose stool but no frank diarrhea.  He denied mucositis, SOB, chest pain, abd pain, pain at portacath site, rectal pain, visible bleeding.   Objective: Vital signs in last 24 hours: Temp:  [98.8 F (37.1 C)-100.1 F (37.8 C)] 98.9 F (37.2 C) (04/05 0600) Pulse Rate:  [69-78] 69 (04/05 0600) Resp:  [16-18] 18 (04/05 0600) BP: (124-143)/(74-82) 131/82 mmHg (04/05 0600) SpO2:  [95 %-96 %] 95 % (04/05 0600)     PHYSICAL EXAM:  Gen: Well-nourished man, in no acute distress. Eyes: No scleral icterus or jaundice. ENT: There was no oropharyngeal lesions. Neck was supple without thyromegaly. Lymphatics: Negative for cervical, supraclavicular, axillary, or inguinal adenopathy.  Respiratory: Lungs were clear bilaterally without wheezing or crackles. Cardiovascular: normal heart rate and rhythm; S1/S2; without murmur, rubs, or gallop. There was no pedal edema. GI: Abdomen was soft, flat, nontender, nondistended, without organomegaly. Musculoskeletal exam: No spinal tenderness on palpation of vertebral spine. Portacath site appeared normal without erythema, purulent discharge or pain on palpation.  Skin exam was without ecchymosis, petechiae. Neuro exam was nonfocal. Patient was alert and oriented. Attention was good. Language was appropriate. Mood was normal without depression. Speech was not pressured. Thought content was not tangential.        Studies/Results: Results for orders placed during the hospital encounter of 11/15/12 (from the past 48 hour(s))  URINALYSIS, ROUTINE W REFLEX MICROSCOPIC     Status: Abnormal   Collection Time    11/15/12  9:04 PM      Result Value Range   Color, Urine YELLOW  YELLOW   APPearance HAZY (*) CLEAR   Specific Gravity, Urine 1.028  1.005 - 1.030   pH 5.5  5.0 - 8.0   Glucose, UA NEGATIVE  NEGATIVE mg/dL   Hgb urine dipstick NEGATIVE  NEGATIVE   Bilirubin Urine NEGATIVE  NEGATIVE   Ketones, ur NEGATIVE  NEGATIVE mg/dL   Protein, ur NEGATIVE  NEGATIVE mg/dL   Urobilinogen, UA 0.2  0.0 - 1.0 mg/dL   Nitrite NEGATIVE  NEGATIVE   Leukocytes, UA NEGATIVE  NEGATIVE   Comment: MICROSCOPIC NOT DONE ON URINES WITH NEGATIVE PROTEIN, BLOOD, LEUKOCYTES, NITRITE, OR GLUCOSE <1000 mg/dL.  CBC WITH DIFFERENTIAL     Status: Abnormal   Collection Time    11/15/12  9:30 PM      Result Value Range   WBC 0.4 (*) 4.0 - 10.5 K/uL   Comment: REPEATED TO VERIFY     CRITICAL RESULT CALLED TO, READ BACK BY AND VERIFIED WITH:     MTIA RN AT 2205 ON [id number] BY DLONG   RBC 3.84 (*) 4.22 - 5.81 MIL/uL   Hemoglobin 11.3 (*) 13.0 - 17.0 g/dL   HCT 82.9 (*) 56.2 - 13.0 %   MCV 85.9  78.0 - 100.0 fL   MCH 29.4  26.0 - 34.0 pg   MCHC 34.2  30.0 - 36.0 g/dL   RDW 86.5  78.4 - 69.6 %   Platelets 154  150 - 400 K/uL   Neutrophils Relative 20 (*) 43 - 77 %   Lymphocytes Relative 46  12 - 46 %   Monocytes Relative 20 (*)  3 - 12 %   Eosinophils Relative 7 (*) 0 - 5 %   Basophils Relative 7 (*) 0 - 1 %   Neutro Abs 0.1 (*) 1.7 - 7.7 K/uL   Lymphs Abs 0.2 (*) 0.7 - 4.0 K/uL   Monocytes Absolute 0.1  0.1 - 1.0 K/uL   Eosinophils Absolute 0.0  0.0 - 0.7 K/uL   Basophils Absolute 0.0  0.0 - 0.1 K/uL   WBC Morphology INCREASED BANDS (>20% BANDS)     Comment: ATYPICAL LYMPHOCYTES     DOHLE BODIES   Smear Review PENDING PATHOLOGIST REVIEW    COMPREHENSIVE METABOLIC PANEL     Status: Abnormal   Collection Time    11/15/12  9:30 PM      Result Value Range   Sodium 137  135 - 145 mEq/L   Potassium 4.3  3.5 - 5.1 mEq/L   Chloride 100  96 - 112 mEq/L   CO2 30  19 - 32 mEq/L   Glucose, Bld 125 (*) 70 - 99 mg/dL   BUN 17  6 - 23 mg/dL   Creatinine, Ser 1.61   0.50 - 1.35 mg/dL   Calcium 8.5  8.4 - 09.6 mg/dL   Total Protein 6.0  6.0 - 8.3 g/dL   Albumin 3.1 (*) 3.5 - 5.2 g/dL   AST 20  0 - 37 U/L   ALT 42  0 - 53 U/L   Alkaline Phosphatase 75  39 - 117 U/L   Total Bilirubin 0.3  0.3 - 1.2 mg/dL   GFR calc non Af Amer 81 (*) >90 mL/min   GFR calc Af Amer >90  >90 mL/min   Comment:            The eGFR has been calculated     using the CKD EPI equation.     This calculation has not been     validated in all clinical     situations.     eGFR's persistently     <90 mL/min signify     possible Chronic Kidney Disease.  PATHOLOGIST SMEAR REVIEW     Status: None   Collection Time    11/15/12  9:30 PM      Result Value Range   Path Review Reviewed By Havery Moros, M.D.     Comment: 04.04.14     NORMOCYTIC ANEMIA AND NEUTROPENIA.  LACTIC ACID, PLASMA     Status: None   Collection Time    11/15/12 10:30 PM      Result Value Range   Lactic Acid, Venous 1.3  0.5 - 2.2 mmol/L  BASIC METABOLIC PANEL     Status: Abnormal   Collection Time    11/16/12  4:50 AM      Result Value Range   Sodium 132 (*) 135 - 145 mEq/L   Potassium 4.1  3.5 - 5.1 mEq/L   Chloride 99  96 - 112 mEq/L   CO2 26  19 - 32 mEq/L   Glucose, Bld 114 (*) 70 - 99 mg/dL   BUN 14  6 - 23 mg/dL   Creatinine, Ser 0.45  0.50 - 1.35 mg/dL   Calcium 8.1 (*) 8.4 - 10.5 mg/dL   GFR calc non Af Amer >90  >90 mL/min   GFR calc Af Amer >90  >90 mL/min   Comment:            The eGFR has been calculated     using the CKD EPI  equation.     This calculation has not been     validated in all clinical     situations.     eGFR's persistently     <90 mL/min signify     possible Chronic Kidney Disease.  CBC     Status: Abnormal   Collection Time    11/16/12  4:50 AM      Result Value Range   WBC 0.3 (*) 4.0 - 10.5 K/uL   Comment: REPEATED TO VERIFY     CRITICAL VALUE NOTED.  VALUE IS CONSISTENT WITH PREVIOUSLY REPORTED AND CALLED VALUE.   RBC 3.25 (*) 4.22 - 5.81 MIL/uL    Hemoglobin 9.8 (*) 13.0 - 17.0 g/dL   HCT 16.1 (*) 09.6 - 04.5 %   MCV 84.6  78.0 - 100.0 fL   MCH 30.2  26.0 - 34.0 pg   MCHC 35.6  30.0 - 36.0 g/dL   RDW 40.9  81.1 - 91.4 %   Platelets 120 (*) 150 - 400 K/uL  TROPONIN I     Status: None   Collection Time    11/16/12 11:05 AM      Result Value Range   Troponin I <0.30  <0.30 ng/mL   Comment:            Due to the release kinetics of cTnI,     a negative result within the first hours     of the onset of symptoms does not rule out     myocardial infarction with certainty.     If myocardial infarction is still suspected,     repeat the test at appropriate intervals.  TROPONIN I     Status: None   Collection Time    11/16/12  3:53 PM      Result Value Range   Troponin I <0.30  <0.30 ng/mL   Comment:            Due to the release kinetics of cTnI,     a negative result within the first hours     of the onset of symptoms does not rule out     myocardial infarction with certainty.     If myocardial infarction is still suspected,     repeat the test at appropriate intervals.  TROPONIN I     Status: None   Collection Time    11/16/12 10:06 PM      Result Value Range   Troponin I <0.30  <0.30 ng/mL   Comment:            Due to the release kinetics of cTnI,     a negative result within the first hours     of the onset of symptoms does not rule out     myocardial infarction with certainty.     If myocardial infarction is still suspected,     repeat the test at appropriate intervals.  CBC WITH DIFFERENTIAL     Status: Abnormal   Collection Time    11/17/12  3:45 AM      Result Value Range   WBC 1.0 (*) 4.0 - 10.5 K/uL   Comment: REPEATED TO VERIFY     CRITICAL VALUE NOTED.  VALUE IS CONSISTENT WITH PREVIOUSLY REPORTED AND CALLED VALUE.   RBC 3.32 (*) 4.22 - 5.81 MIL/uL   Hemoglobin 9.8 (*) 13.0 - 17.0 g/dL   HCT 78.2 (*) 95.6 - 21.3 %   MCV 85.2  78.0 - 100.0 fL  MCH 29.5  26.0 - 34.0 pg   MCHC 34.6  30.0 - 36.0 g/dL    RDW 16.1  09.6 - 04.5 %   Platelets 119 (*) 150 - 400 K/uL   Comment: CONSISTENT WITH PREVIOUS RESULT   Neutrophils Relative 27 (*) 43 - 77 %   Lymphocytes Relative 29  12 - 46 %   Monocytes Relative 35 (*) 3 - 12 %   Eosinophils Relative 5  0 - 5 %   Basophils Relative 4 (*) 0 - 1 %   Neutro Abs 0.3 (*) 1.7 - 7.7 K/uL   Lymphs Abs 0.3 (*) 0.7 - 4.0 K/uL   Monocytes Absolute 0.3  0.1 - 1.0 K/uL   Eosinophils Absolute 0.1  0.0 - 0.7 K/uL   Basophils Absolute 0.0  0.0 - 0.1 K/uL   RBC Morphology POLYCHROMASIA PRESENT     WBC Morphology MILD LEFT SHIFT (1-5% METAS, OCC MYELO, OCC BANDS)     Comment: TOXIC GRANULATION     DOHLE BODIES     ATYPICAL LYMPHOCYTES  BASIC METABOLIC PANEL     Status: Abnormal   Collection Time    11/17/12  3:45 AM      Result Value Range   Sodium 135  135 - 145 mEq/L   Potassium 3.9  3.5 - 5.1 mEq/L   Chloride 100  96 - 112 mEq/L   CO2 28  19 - 32 mEq/L   Glucose, Bld 106 (*) 70 - 99 mg/dL   BUN 12  6 - 23 mg/dL   Creatinine, Ser 4.09  0.50 - 1.35 mg/dL   Calcium 8.3 (*) 8.4 - 10.5 mg/dL   GFR calc non Af Amer >90  >90 mL/min   GFR calc Af Amer >90  >90 mL/min   Comment:            The eGFR has been calculated     using the CKD EPI equation.     This calculation has not been     validated in all clinical     situations.     eGFR's persistently     <90 mL/min signify     possible Chronic Kidney Disease.  MRSA PCR SCREENING     Status: None   Collection Time    11/17/12  8:01 AM      Result Value Range   MRSA by PCR NEGATIVE  NEGATIVE   Comment:            The GeneXpert MRSA Assay (FDA     approved for NASAL specimens     only), is one component of a     comprehensive MRSA colonization     surveillance program. It is not     intended to diagnose MRSA     infection nor to guide or     monitor treatment for     MRSA infections.   Dg Chest 2 View  11/15/2012  *RADIOLOGY REPORT*  Clinical Data: Loss of consciousness.  Fever.  Lymphoma.  CHEST  - 2 VIEW  Comparison: 12/01/2004  Findings: Lungs are clear.  Negative for pneumonia or effusion. Vascularity normal.  Port-A-Cath tip in the SVC. Negative for adenopathy or mass.  IMPRESSION: No active cardiopulmonary abnormality.  Negative for pneumonia.   Original Report Authenticated By: Janeece Riggers, M.D.    Ct Head Wo Contrast  11/15/2012  *RADIOLOGY REPORT*  Clinical Data: History of lymphoma.  Syncope.  CT HEAD WITHOUT CONTRAST  Technique:  Contiguous axial  images were obtained from the base of the skull through the vertex without contrast.  Comparison: None.  Findings: No mass effect, midline shift, or acute intracranial hemorrhage.  Dilated perivascular space in the lower left basal ganglia.  Brain parenchyma and ventricles system are within normal limits.  Mastoid air cells are clear.  Visualized paranasal sinuses are clear.  IMPRESSION: No acute intracranial pathology.   Original Report Authenticated By: Jolaine Click, M.D.      MEDICATIONS: reviewed.     PROBLEM LIST AND IMPRESSION:  - Abdominal non Hodgkins' lymphoma.  S/p 4 cycles of RCHOP (last on 11/08/12). - Pancytopenia from recent chemo. - Neutropenic fever:  Negative UA, neg CXR, blood culture negative to date.  - Nausea/vomiting from chemo:  Improved.  - Syncope from most likely dehydration:  Improved.  - Loose stool from presumed recent chemo and now antibiotics.     RECOMMENDATION:  - Continue day #3 of empiric Vancomycin and Cefepime. - If blood culture remains negative tomorrow, consider stopping Vancomycin. - Once ANC >1.0 and no fever, consider stopping Cefepime and start on empiric oral antibiotic. - No indication for Neupogen since he had received Neulasta and his ANC is trending up.   - Follow daily CBC and transfuse for Hgb <7 or Plt <10K or active bleeding.  No indication to transfuse today.

## 2012-11-17 NOTE — Progress Notes (Signed)
[facility] PROGRESS NOTE  Adam Alvarado WUJ:[id number] DOB: [birth year] DOA: 11/15/2012 PCP: [name], MD  Assessment/Plan: Syncope  -Suspect combination of vagal reaction and volume depletion  -Check orthostatics--positive on 11/16/12 -Cycle troponins--neg -EKG is reassuring  -Echocardiogram 08/31/2012--EF 60-65%, no WMA  -Continue IV fluids  -CT brain is negative  Neutropenia  -Tmax 100.1 during this hospitalization, No temps greater than 38.0C -ANC approx 270 on 11/17/12 -Patient received Neulasta 11/09/2012  -Follow blood cultures from 11/15/12-->NEG to date  -No pyuria but the patient is neutropenic, no dysuria  -Chest x-ray is clear in the face of neutropenia  -plan to discontinue vanco in am if blood culture neg -plan to discontinue abx when ANC>500 and pt remains afebril B-cell lymphoma  -Dr. Truett Perna has been notified of patient's hospitalization  -Fourth cycle CHOP/Rituxan on 11/08/12  -Appreciate oncology followup Night sweats  - "B"-type symptoms vs occult infection    Family Communication:   Mother at beside Disposition Plan:   Home when medically stable  Antibiotics:  Vancomycin 11/16/2012>>> Cefepime 11/15/12>>>         Procedures/Studies: Dg Chest 2 View  11/15/2012  *RADIOLOGY REPORT*  Clinical Data: Loss of consciousness.  Fever.  Lymphoma.  CHEST - 2 VIEW  Comparison: 12/01/2004  Findings: Lungs are clear.  Negative for pneumonia or effusion. Vascularity normal.  Port-A-Cath tip in the SVC. Negative for adenopathy or mass.  IMPRESSION: No active cardiopulmonary abnormality.  Negative for pneumonia.   Original Report Authenticated By: Janeece Riggers, M.D.    Ct Head Wo Contrast  11/15/2012  *RADIOLOGY REPORT*  Clinical Data: History of lymphoma.  Syncope.  CT HEAD WITHOUT CONTRAST  Technique:  Contiguous axial images were obtained from the base of the skull through the vertex without contrast.  Comparison: None.  Findings: No mass effect, midline shift,  or acute intracranial hemorrhage.  Dilated perivascular space in the lower left basal ganglia.  Brain parenchyma and ventricles system are within normal limits.  Mastoid air cells are clear.  Visualized paranasal sinuses are clear.  IMPRESSION: No acute intracranial pathology.   Original Report Authenticated By: Jolaine Click, M.D.          Subjective: The patient had one episode of emesis yesterday. Since then he's been tolerating his diet. Denies any fevers, chills, chest pain, shortness breath, nausea, vomiting, diarrhea, abdominal pain, dysuria, hematuria no rashes.  Objective: Filed Vitals:   11/16/12 0609 11/16/12 1425 11/16/12 2200 11/17/12 0600  BP: 145/81 143/79 124/74 131/82  Pulse: 89 78 70 69  Temp: 99.8 F (37.7 C) 100.1 F (37.8 C) 98.8 F (37.1 C) 98.9 F (37.2 C)  TempSrc: Oral Oral Oral Oral  Resp: 18 16 18 18   Height:      Weight:      SpO2: 96% 96% 96% 95%    Intake/Output Summary (Last 24 hours) at 11/17/12 1155 Last data filed at 11/16/12 1934  Gross per 24 hour  Intake    600 ml  Output      0 ml  Net    600 ml   Weight change:  Exam:   General:  Pt is alert, follows commands appropriately, not in acute distress  HEENT: No icterus, No thrush, No neck mass, [place]/AT  Cardiovascular: RRR, S1/S2, no rubs, no gallops  Respiratory: CTA bilaterally, no wheezing, no crackles, no rhonchi  Abdomen: Soft/+BS, non tender, non distended, no guarding  Extremities: No edema, No lymphangitis, No petechiae, No rashes, no synovitis; Port-A-Cath site without erythema  Data  Reviewed: Basic Metabolic Panel:  Recent Labs Lab 11/15/12 2130 11/16/12 0450 11/17/12 0345  NA 137 132* 135  K 4.3 4.1 3.9  CL 100 99 100  CO2 30 26 28   GLUCOSE 125* 114* 106*  BUN 17 14 12   CREATININE 1.02 0.84 0.91  CALCIUM 8.5 8.1* 8.3*   Liver Function Tests:  Recent Labs Lab 11/15/12 2130  AST 20  ALT 42  ALKPHOS 75  BILITOT 0.3  PROT 6.0  ALBUMIN 3.1*   No  results found for this basename: LIPASE, AMYLASE,  in the last 168 hours No results found for this basename: AMMONIA,  in the last 168 hours CBC:  Recent Labs Lab 11/15/12 2130 11/16/12 0450 11/17/12 0345  WBC 0.4* 0.3* 1.0*  NEUTROABS 0.1*  --  0.3*  HGB 11.3* 9.8* 9.8*  HCT 33.0* 27.5* 28.3*  MCV 85.9 84.6 85.2  PLT 154 120* 119*   Cardiac Enzymes:  Recent Labs Lab 11/16/12 1105 11/16/12 1553 11/16/12 2206  TROPONINI <0.30 <0.30 <0.30   BNP: No components found with this basename: POCBNP,  CBG: No results found for this basename: GLUCAP,  in the last 168 hours  Recent Results (from the past 240 hour(s))  MRSA PCR SCREENING     Status: None   Collection Time    11/17/12  8:01 AM      Result Value Range Status   MRSA by PCR NEGATIVE  NEGATIVE Final   Comment:            The GeneXpert MRSA Assay (FDA     approved for NASAL specimens     only), is one component of a     comprehensive MRSA colonization     surveillance program. It is not     intended to diagnose MRSA     infection nor to guide or     monitor treatment for     MRSA infections.     Scheduled Meds: . ceFEPime (MAXIPIME) IV  2 g Intravenous Q8H  . enoxaparin (LOVENOX) injection  50 mg Subcutaneous Q24H  . sodium chloride  3 mL Intravenous Q12H  . sodium chloride  3 mL Intravenous Q12H  . vancomycin  1,000 mg Intravenous Q8H   Continuous Infusions: . sodium chloride 75 mL/hr at 11/16/12 1606     [name], DO  [facility] Pager [phone]  If 7PM-7AM, please contact night-coverage [email or web address] Password [email or web address] 11/17/2012, 11:55 AM   LOS: 2 days

## 2012-11-18 LAB — CBC WITH DIFFERENTIAL/PLATELET
Basophils Absolute: 0.1 10*3/uL (ref 0.0–0.1)
Eosinophils Relative: 1 % (ref 0–5)
Lymphocytes Relative: 8 % — ABNORMAL LOW (ref 12–46)
Monocytes Relative: 17 % — ABNORMAL HIGH (ref 3–12)
Neutrophils Relative %: 72 % (ref 43–77)
Platelets: 126 10*3/uL — ABNORMAL LOW (ref 150–400)
RBC: 3.37 MIL/uL — ABNORMAL LOW (ref 4.22–5.81)
RDW: 15 % (ref 11.5–15.5)
WBC: 5.5 10*3/uL (ref 4.0–10.5)

## 2012-11-18 NOTE — Progress Notes (Signed)
[facility] PROGRESS NOTE  ARLANDO LEISINGER ZOX:[id number] DOB: [birth year] DOA: 11/15/2012 PCP: [name], MD  Assessment/Plan: Syncope  -Suspect combination of vagal reaction and volume depletion  -Check orthostatics--positive on 11/16/12  -Cycle troponins--neg  -EKG is reassuring  -Echocardiogram 08/31/2012--EF 60-65%, no WMA  -Continue IV fluids  -CT brain is negative  Neutropenia  -Tmax 100.1 during this hospitalization, No temps greater than 38.0C  -resolved on 11/18/12 with WBC 5500 -Patient received Neulasta 11/09/2012  -Follow blood cultures from 11/15/12-->NEG to date  -No pyuria but the patient is neutropenic, no dysuria  -Chest x-ray is clear in the face of neutropenia  -plan to discontinue vanco  -plan to discontinue cefepime if ANC>500 and pt remains afebrile (temp <38.0C) in next 24 hrs Loose stools -Check C. difficile PCR B-cell lymphoma  -Dr. Truett Perna has been notified of patient's hospitalization  -Fourth cycle CHOP/Rituxan on 11/08/12  -Appreciate oncology followup  Night sweats  - "B"-type symptoms vs occult infection  Family Communication: wife at beside  Disposition Plan: Home 11/19/12 if ok with MedOnc Antibiotics:  Vancomycin 11/16/2012>>> 11/18/2012 Cefepime 11/15/12>>>       Procedures/Studies: Dg Chest 2 View  11/15/2012  *RADIOLOGY REPORT*  Clinical Data: Loss of consciousness.  Fever.  Lymphoma.  CHEST - 2 VIEW  Comparison: 12/01/2004  Findings: Lungs are clear.  Negative for pneumonia or effusion. Vascularity normal.  Port-A-Cath tip in the SVC. Negative for adenopathy or mass.  IMPRESSION: No active cardiopulmonary abnormality.  Negative for pneumonia.   Original Report Authenticated By: Janeece Riggers, M.D.    Ct Head Wo Contrast  11/15/2012  *RADIOLOGY REPORT*  Clinical Data: History of lymphoma.  Syncope.  CT HEAD WITHOUT CONTRAST  Technique:  Contiguous axial images were obtained from the base of the skull through the vertex without contrast.   Comparison: None.  Findings: No mass effect, midline shift, or acute intracranial hemorrhage.  Dilated perivascular space in the lower left basal ganglia.  Brain parenchyma and ventricles system are within normal limits.  Mastoid air cells are clear.  Visualized paranasal sinuses are clear.  IMPRESSION: No acute intracranial pathology.   Original Report Authenticated By: Jolaine Click, M.D.          Subjective: Patient denies fevers, chills, chest pain, shortness breath, nausea, vomiting. No dominant pain. He had 2 loose stools today. No hematochezia or melena. No dizziness or headache.  Objective: Filed Vitals:   11/17/12 1306 11/17/12 2200 11/18/12 0600 11/18/12 1200  BP: 150/93 130/74 130/73   Pulse: 78 80 69   Temp: 98.3 F (36.8 C) 99.4 F (37.4 C) 99.4 F (37.4 C) 99.4 F (37.4 C)  TempSrc: Oral Oral Oral Oral  Resp: 18 20 20    Height:      Weight:      SpO2: 9% 95% 94%     Intake/Output Summary (Last 24 hours) at 11/18/12 1756 Last data filed at 11/18/12 1352  Gross per 24 hour  Intake   2571 ml  Output      0 ml  Net   2571 ml   Weight change:  Exam:   General:  Pt is alert, follows commands appropriately, not in acute distress  HEENT: No icterus, No thrush,[place]/AT  Cardiovascular: RRR, S1/S2, no rubs, no gallops  Respiratory: CTA bilaterally, no wheezing, no crackles, no rhonchi  Abdomen: Soft/+BS, non tender, non distended, no guarding  Extremities: No edema, No lymphangitis, No petechiae, No rashes, no synovitis  Data Reviewed: Basic Metabolic Panel:  Recent Labs Lab  11/15/12 2130 11/16/12 0450 11/17/12 0345  NA 137 132* 135  K 4.3 4.1 3.9  CL 100 99 100  CO2 30 26 28   GLUCOSE 125* 114* 106*  BUN 17 14 12   CREATININE 1.02 0.84 0.91  CALCIUM 8.5 8.1* 8.3*   Liver Function Tests:  Recent Labs Lab 11/15/12 2130  AST 20  ALT 42  ALKPHOS 75  BILITOT 0.3  PROT 6.0  ALBUMIN 3.1*   No results found for this basename: LIPASE, AMYLASE,  in  the last 168 hours No results found for this basename: AMMONIA,  in the last 168 hours CBC:  Recent Labs Lab 11/15/12 2130 11/16/12 0450 11/17/12 0345 11/18/12 0530  WBC 0.4* 0.3* 1.0* 5.5  NEUTROABS 0.1*  --  0.3* 4.0  HGB 11.3* 9.8* 9.8* 9.9*  HCT 33.0* 27.5* 28.3* 29.2*  MCV 85.9 84.6 85.2 86.6  PLT 154 120* 119* 126*   Cardiac Enzymes:  Recent Labs Lab 11/16/12 1105 11/16/12 1553 11/16/12 2206  TROPONINI <0.30 <0.30 <0.30   BNP: No components found with this basename: POCBNP,  CBG: No results found for this basename: GLUCAP,  in the last 168 hours  Recent Results (from the past 240 hour(s))  CULTURE, BLOOD (ROUTINE X 2)     Status: None   Collection Time    11/15/12 10:25 PM      Result Value Range Status   Specimen Description BLOOD RIGHT HAND   Final   Special Requests BOTTLES DRAWN AEROBIC AND ANAEROBIC 3CC   Final   Culture  Setup Time 11/16/2012 01:09   Final   Culture     Final   Value:        BLOOD CULTURE RECEIVED NO GROWTH TO DATE CULTURE WILL BE HELD FOR 5 DAYS BEFORE ISSUING A FINAL NEGATIVE REPORT   Report Status PENDING   Incomplete  CULTURE, BLOOD (ROUTINE X 2)     Status: None   Collection Time    11/15/12 10:30 PM      Result Value Range Status   Specimen Description BLOOD RIGHT ARM   Final   Special Requests BOTTLES DRAWN AEROBIC AND ANAEROBIC 5CC   Final   Culture  Setup Time 11/16/2012 02:20   Final   Culture     Final   Value:        BLOOD CULTURE RECEIVED NO GROWTH TO DATE CULTURE WILL BE HELD FOR 5 DAYS BEFORE ISSUING A FINAL NEGATIVE REPORT   Report Status PENDING   Incomplete  CULTURE, BLOOD (ROUTINE X 2)     Status: None   Collection Time    11/16/12  6:55 PM      Result Value Range Status   Specimen Description BLOOD ARM   Final   Special Requests BOTTLES DRAWN AEROBIC AND ANAEROBIC 10CC   Final   Culture  Setup Time 11/17/2012 01:48   Final   Culture     Final   Value:        BLOOD CULTURE RECEIVED NO GROWTH TO DATE CULTURE  WILL BE HELD FOR 5 DAYS BEFORE ISSUING A FINAL NEGATIVE REPORT   Report Status PENDING   Incomplete  MRSA PCR SCREENING     Status: None   Collection Time    11/17/12  8:01 AM      Result Value Range Status   MRSA by PCR NEGATIVE  NEGATIVE Final   Comment:            The GeneXpert MRSA Assay (FDA  approved for NASAL specimens     only), is one component of a     comprehensive MRSA colonization     surveillance program. It is not     intended to diagnose MRSA     infection nor to guide or     monitor treatment for     MRSA infections.     Scheduled Meds: . ceFEPime (MAXIPIME) IV  2 g Intravenous Q8H  . enoxaparin (LOVENOX) injection  50 mg Subcutaneous Q24H  . sodium chloride  3 mL Intravenous Q12H  . sodium chloride  3 mL Intravenous Q12H   Continuous Infusions: . sodium chloride 75 mL/hr at 11/18/12 1352     Herrick Hartog, DO  [facility] Pager [phone]  If 7PM-7AM, please contact night-coverage [email or web address] Password [email or web address] 11/18/2012, 5:56 PM   LOS: 3 days

## 2012-11-18 NOTE — Progress Notes (Signed)
ANTIBIOTIC CONSULT NOTE - FOLLOW UP  Pharmacy Consult for Cefepime Indication: Neutropenic Fever  No Known Allergies  Patient Measurements: Height: 5' 10.08" (178 cm) Weight: 223 lb 15.8 oz (101.6 kg) IBW/kg (Calculated) : 73.18 Adjusted Body Weight:   Vital Signs: Temp: 99.4 F (37.4 C) (04/06 1200) Temp src: Oral (04/06 1200) BP: 130/73 mmHg (04/06 0600) Pulse Rate: 69 (04/06 0600) Intake/Output from previous day: 04/05 0701 - 04/06 0700 In: 4102.5 [P.O.:480; I.V.:3622.5] Out: -  Intake/Output from this shift:    Labs:  Recent Labs  11/15/12 2130 11/16/12 0450 11/17/12 0345 11/18/12 0530  WBC 0.4* 0.3* 1.0* 5.5  HGB 11.3* 9.8* 9.8* 9.9*  PLT 154 120* 119* 126*  CREATININE 1.02 0.84 0.91  --    Estimated Creatinine Clearance: 109.8 ml/min (by C-G formula based on Cr of 0.91). No results found for this basename: VANCOTROUGH, VANCOPEAK, VANCORANDOM, GENTTROUGH, GENTPEAK, GENTRANDOM, TOBRATROUGH, TOBRAPEAK, TOBRARND, AMIKACINPEAK, AMIKACINTROU, AMIKACIN,  in the last 72 hours   Microbiology: Recent Results (from the past 720 hour(s))  CULTURE, BLOOD (ROUTINE X 2)     Status: None   Collection Time    11/15/12 10:25 PM      Result Value Range Status   Specimen Description BLOOD RIGHT HAND   Final   Special Requests BOTTLES DRAWN AEROBIC AND ANAEROBIC 3CC   Final   Culture  Setup Time 11/16/2012 01:09   Final   Culture     Final   Value:        BLOOD CULTURE RECEIVED NO GROWTH TO DATE CULTURE WILL BE HELD FOR 5 DAYS BEFORE ISSUING A FINAL NEGATIVE REPORT   Report Status PENDING   Incomplete  CULTURE, BLOOD (ROUTINE X 2)     Status: None   Collection Time    11/15/12 10:30 PM      Result Value Range Status   Specimen Description BLOOD RIGHT ARM   Final   Special Requests BOTTLES DRAWN AEROBIC AND ANAEROBIC 5CC   Final   Culture  Setup Time 11/16/2012 02:20   Final   Culture     Final   Value:        BLOOD CULTURE RECEIVED NO GROWTH TO DATE CULTURE WILL BE HELD  FOR 5 DAYS BEFORE ISSUING A FINAL NEGATIVE REPORT   Report Status PENDING   Incomplete  CULTURE, BLOOD (ROUTINE X 2)     Status: None   Collection Time    11/16/12  6:55 PM      Result Value Range Status   Specimen Description BLOOD ARM   Final   Special Requests BOTTLES DRAWN AEROBIC AND ANAEROBIC 10CC   Final   Culture  Setup Time 11/17/2012 01:48   Final   Culture     Final   Value:        BLOOD CULTURE RECEIVED NO GROWTH TO DATE CULTURE WILL BE HELD FOR 5 DAYS BEFORE ISSUING A FINAL NEGATIVE REPORT   Report Status PENDING   Incomplete  MRSA PCR SCREENING     Status: None   Collection Time    11/17/12  8:01 AM      Result Value Range Status   MRSA by PCR NEGATIVE  NEGATIVE Final   Comment:            The GeneXpert MRSA Assay (FDA     approved for NASAL specimens     only), is one component of a     comprehensive MRSA colonization     surveillance program.  It is not     intended to diagnose MRSA     infection nor to guide or     monitor treatment for     MRSA infections.    Anti-infectives   Start     Dose/Rate Route Frequency Ordered Stop   11/16/12 0800  vancomycin (VANCOCIN) IVPB 1000 mg/200 mL premix     1,000 mg 200 mL/hr over 60 Minutes Intravenous Every 8 hours 11/16/12 0143     11/15/12 2230  vancomycin (VANCOCIN) IVPB 1000 mg/200 mL premix     1,000 mg 200 mL/hr over 60 Minutes Intravenous  Once 11/15/12 2210 11/16/12 0200   11/15/12 2230  ceFEPIme (MAXIPIME) 1 g in dextrose 5 % 50 mL IVPB  Status:  Discontinued     1 g 100 mL/hr over 30 Minutes Intravenous 3 times per day 11/15/12 2223 11/15/12 2224   11/15/12 2230  ceFEPIme (MAXIPIME) 2 g in dextrose 5 % 50 mL IVPB     2 g 100 mL/hr over 30 Minutes Intravenous 3 times per day 11/15/12 2224        Assessment: 56 yom with h/o non-Hodgkin's lymphoma currently undergoing chemotherapy (last chemo 11/09/12, with Neulasta 10/19/12) on D#4 antibiotics for neutropenic fever.  Today, vancomycin d/c'd, cefepime  continued.  Renal fxn remains stable.  WBC recovering, now WNL.  Afebrile.  Negative blood cultures so far.    Plan:  1.  Continue Cefepime 2g IV 8 hours.   2.  F/u clinical course  Deanda Ruddell E 11/18/2012,1:39 PM

## 2012-11-18 NOTE — Progress Notes (Signed)
Adam Alvarado   DOB:November 17, 1955   WG#:[id number]   VHQ#:[id number]  Subjective: Patient does not feel as well as he had been feeling yesterday. However he has remained afebrile. His white count is back up to 5.5. He has no nausea or vomiting. He still continues to have some diarrhea. He is on cefepime and vancomycin.   Objective:  Filed Vitals:   11/18/12 1200  BP:   Pulse:   Temp: 99.4 F (37.4 C)  Resp:     Body mass index is 32.07 kg/(m^2).  Intake/Output Summary (Last 24 hours) at 11/18/12 1302 Last data filed at 11/18/12 0600  Gross per 24 hour  Intake 3862.5 ml  Output      0 ml  Net 3862.5 ml     Sclerae unicteric  Oropharynx clear  No peripheral adenopathy  Lungs clear -- no rales or rhonchi  Heart regular rate and rhythm  Abdomen benign  MSK no focal spinal tenderness, no peripheral edema  Neuro nonfocal    CBG (last 3)  No results found for this basename: GLUCAP,  in the last 72 hours   Labs:  Lab Results  Component Value Date   WBC 5.5 11/18/2012   HGB 9.9* 11/18/2012   HCT 29.2* 11/18/2012   MCV 86.6 11/18/2012   PLT 126* 11/18/2012   NEUTROABS 4.0 11/18/2012    Urine Studies No results found for this basename: UACOL, UAPR, USPG, UPH, UTP, UGL, UKET, UBIL, UHGB, UNIT, UROB, ULEU, UEPI, UWBC, URBC, UBAC, CAST, CRYS, UCOM, BILUA,  in the last 72 hours  Basic Metabolic Panel:  Recent Labs Lab 11/15/12 2130 11/16/12 0450 11/17/12 0345  NA 137 132* 135  K 4.3 4.1 3.9  CL 100 99 100  CO2 30 26 28   GLUCOSE 125* 114* 106*  BUN 17 14 12   CREATININE 1.02 0.84 0.91  CALCIUM 8.5 8.1* 8.3*   GFR Estimated Creatinine Clearance: 109.8 ml/min (by C-G formula based on Cr of 0.91). Liver Function Tests:  Recent Labs Lab 11/15/12 2130  AST 20  ALT 42  ALKPHOS 75  BILITOT 0.3  PROT 6.0  ALBUMIN 3.1*   No results found for this basename: LIPASE, AMYLASE,  in the last 168 hours No results found for this basename: AMMONIA,  in the last 168 hours Coagulation  profile No results found for this basename: INR, PROTIME,  in the last 168 hours  CBC:  Recent Labs Lab 11/15/12 2130 11/16/12 0450 11/17/12 0345 11/18/12 0530  WBC 0.4* 0.3* 1.0* 5.5  NEUTROABS 0.1*  --  0.3* 4.0  HGB 11.3* 9.8* 9.8* 9.9*  HCT 33.0* 27.5* 28.3* 29.2*  MCV 85.9 84.6 85.2 86.6  PLT 154 120* 119* 126*   Cardiac Enzymes:  Recent Labs Lab 11/16/12 1105 11/16/12 1553 11/16/12 2206  TROPONINI <0.30 <0.30 <0.30   BNP: No components found with this basename: POCBNP,  CBG: No results found for this basename: GLUCAP,  in the last 168 hours D-Dimer No results found for this basename: DDIMER,  in the last 72 hours Hgb A1c No results found for this basename: HGBA1C,  in the last 72 hours Lipid Profile No results found for this basename: CHOL, HDL, LDLCALC, TRIG, CHOLHDL, LDLDIRECT,  in the last 72 hours Thyroid function studies No results found for this basename: TSH, T4TOTAL, FREET3, T3FREE, THYROIDAB,  in the last 72 hours Anemia work up No results found for this basename: VITAMINB12, FOLATE, FERRITIN, TIBC, IRON, RETICCTPCT,  in the last 72 hours Microbiology Recent Results (  from the past 240 hour(s))  CULTURE, BLOOD (ROUTINE X 2)     Status: None   Collection Time    11/15/12 10:25 PM      Result Value Range Status   Specimen Description BLOOD RIGHT HAND   Final   Special Requests BOTTLES DRAWN AEROBIC AND ANAEROBIC 3CC   Final   Culture  Setup Time 11/16/2012 01:09   Final   Culture     Final   Value:        BLOOD CULTURE RECEIVED NO GROWTH TO DATE CULTURE WILL BE HELD FOR 5 DAYS BEFORE ISSUING A FINAL NEGATIVE REPORT   Report Status PENDING   Incomplete  CULTURE, BLOOD (ROUTINE X 2)     Status: None   Collection Time    11/15/12 10:30 PM      Result Value Range Status   Specimen Description BLOOD RIGHT ARM   Final   Special Requests BOTTLES DRAWN AEROBIC AND ANAEROBIC 5CC   Final   Culture  Setup Time 11/16/2012 02:20   Final   Culture     Final    Value:        BLOOD CULTURE RECEIVED NO GROWTH TO DATE CULTURE WILL BE HELD FOR 5 DAYS BEFORE ISSUING A FINAL NEGATIVE REPORT   Report Status PENDING   Incomplete  CULTURE, BLOOD (ROUTINE X 2)     Status: None   Collection Time    11/16/12  6:55 PM      Result Value Range Status   Specimen Description BLOOD ARM   Final   Special Requests BOTTLES DRAWN AEROBIC AND ANAEROBIC 10CC   Final   Culture  Setup Time 11/17/2012 01:48   Final   Culture     Final   Value:        BLOOD CULTURE RECEIVED NO GROWTH TO DATE CULTURE WILL BE HELD FOR 5 DAYS BEFORE ISSUING A FINAL NEGATIVE REPORT   Report Status PENDING   Incomplete  MRSA PCR SCREENING     Status: None   Collection Time    11/17/12  8:01 AM      Result Value Range Status   MRSA by PCR NEGATIVE  NEGATIVE Final   Comment:            The GeneXpert MRSA Assay (FDA     approved for NASAL specimens     only), is one component of a     comprehensive MRSA colonization     surveillance program. It is not     intended to diagnose MRSA     infection nor to guide or     monitor treatment for     MRSA infections.      Studies:  No results found.  Assessment: 57 y.o. with  #1 abdominal non-Hodgkin lymphoma status post 4 cycles of R. CHOP. Last one given on 11/08/2012.  #2 patient with pancytopenia secondary to recent chemotherapy.  #3 neutropenic fever all cultures are negative up today. His counts are recovering.  #4 syncope improved with hydration.  #5 loose stool from recent chemotherapy and now antibiotics.    Plan:   #1 we will discontinue vancomycin but continue cefepime.  #2 hydration improved significantly encourage by mouth intake.  #3 nausea vomiting improved.  #4 disposition possibly home in the morning.   Welton Flakes, Janaria Mccammon 11/18/2012

## 2012-11-19 DIAGNOSIS — R61 Generalized hyperhidrosis: Secondary | ICD-10-CM

## 2012-11-19 DIAGNOSIS — R609 Edema, unspecified: Secondary | ICD-10-CM

## 2012-11-19 LAB — CBC WITH DIFFERENTIAL/PLATELET
Basophils Absolute: 0.1 10*3/uL (ref 0.0–0.1)
Lymphocytes Relative: 4 % — ABNORMAL LOW (ref 12–46)
Lymphs Abs: 0.4 10*3/uL — ABNORMAL LOW (ref 0.7–4.0)
MCV: 86.3 fL (ref 78.0–100.0)
Monocytes Relative: 12 % (ref 3–12)
Neutrophils Relative %: 83 % — ABNORMAL HIGH (ref 43–77)
Platelets: 128 10*3/uL — ABNORMAL LOW (ref 150–400)
RDW: 15.2 % (ref 11.5–15.5)
WBC Morphology: INCREASED
WBC: 9.4 10*3/uL (ref 4.0–10.5)

## 2012-11-19 NOTE — Progress Notes (Signed)
[facility] PROGRESS NOTE  Adam Alvarado AVW:[id number] DOB: [birth year] DOA: 11/15/2012 PCP: [name], MD  Assessment/Plan: Syncope  -Suspect combination of vagal reaction and volume depletion  -Check orthostatics--positive on 11/16/12  -Cycle troponins--neg  -EKG is reassuring  -Echocardiogram 08/31/2012--EF 60-65%, no WMA  -Continue IV fluids  -CT brain is negative  Low grade temperature -No temperatures greater than 38.0C since admission -Cultures are negative -Chest x-ray negative -CT brain without any sinus disease -Discontinue all antibiotics and observe--> occult infection will declare itself; may be drug fever -Patient WBC recovered x 48 hrs (ANC >1000) and no temp > 38.0C during entire admission -Venous duplex lower extremities for DVT -No recent travels to suggest exotic infection although patient has cats at home--patient does not change litter box -May consider echocardiogram to rule out SBE if fever arises off of antibiotics Neutropenia  -Tmax 100.1 during this hospitalization, No temps greater than 38.0C  -resolved on 11/18/12 with WBC 5500  -Patient received Neulasta 11/09/2012  -Follow blood cultures from 11/15/12-->NEG to date  -No pyuria but the patient is neutropenic, no dysuria  -Chest x-ray is clear in the face of neutropenia  -plan to discontinue vanco  Loose stools  -Check C. difficile PCR-->neg B-cell lymphoma  -Dr. Truett Perna has been notified of patient's hospitalization  -Fourth cycle CHOP/Rituxan on 11/08/12  -Appreciate oncology followup  Night sweats  - "B"-type symptoms vs occult infection  Family Communication: mother at beside  Disposition Plan: Home 11/19/12 if ok with MedOnc  Antibiotics:  Vancomycin 11/16/2012>>> 11/18/2012  Cefepime 11/15/12>>>11/19/12        Procedures/Studies: Dg Chest 2 View  11/15/2012  *RADIOLOGY REPORT*  Clinical Data: Loss of consciousness.  Fever.  Lymphoma.  CHEST - 2 VIEW  Comparison: 12/01/2004  Findings:  Lungs are clear.  Negative for pneumonia or effusion. Vascularity normal.  Port-A-Cath tip in the SVC. Negative for adenopathy or mass.  IMPRESSION: No active cardiopulmonary abnormality.  Negative for pneumonia.   Original Report Authenticated By: Janeece Riggers, M.D.    Ct Head Wo Contrast  11/15/2012  *RADIOLOGY REPORT*  Clinical Data: History of lymphoma.  Syncope.  CT HEAD WITHOUT CONTRAST  Technique:  Contiguous axial images were obtained from the base of the skull through the vertex without contrast.  Comparison: None.  Findings: No mass effect, midline shift, or acute intracranial hemorrhage.  Dilated perivascular space in the lower left basal ganglia.  Brain parenchyma and ventricles system are within normal limits.  Mastoid air cells are clear.  Visualized paranasal sinuses are clear.  IMPRESSION: No acute intracranial pathology.   Original Report Authenticated By: Jolaine Click, M.D.          Subjective: Overall patient feels much better. He just complains of general fatigue. Denies chills, riders, chest discomfort, nausea, vomiting, abdominal pain, dysuria, hematuria, dizziness, headache, visual changes, rashes.  Objective: Filed Vitals:   11/18/12 1200 11/18/12 2200 11/19/12 0600 11/19/12 1126  BP:  133/76 143/72   Pulse:  77 96   Temp: 99.4 F (37.4 C) 98.9 F (37.2 C) 100.2 F (37.9 C) 99.4 F (37.4 C)  TempSrc: Oral Oral Oral Oral  Resp:  20 18   Height:      Weight:      SpO2:  96% 95%     Intake/Output Summary (Last 24 hours) at 11/19/12 1351 Last data filed at 11/19/12 0700  Gross per 24 hour  Intake   3291 ml  Output      0 ml  Net  3291 ml   Weight change:  Exam:   General:  Pt is alert, follows commands appropriately, not in acute distress  HEENT: No icterus, No thrush,  [place]/AT  Cardiovascular: RRR, S1/S2, no rubs, no gallops  Respiratory: CTA bilaterally, no wheezing, no crackles, no rhonchi  Abdomen: Soft/+BS, non tender, non distended, no  guarding  Extremities: No edema, No lymphangitis, No petechiae, No rashes, no synovitis  Data Reviewed: Basic Metabolic Panel:  Recent Labs Lab 11/15/12 2130 11/16/12 0450 11/17/12 0345  NA 137 132* 135  K 4.3 4.1 3.9  CL 100 99 100  CO2 30 26 28   GLUCOSE 125* 114* 106*  BUN 17 14 12   CREATININE 1.02 0.84 0.91  CALCIUM 8.5 8.1* 8.3*   Liver Function Tests:  Recent Labs Lab 11/15/12 2130  AST 20  ALT 42  ALKPHOS 75  BILITOT 0.3  PROT 6.0  ALBUMIN 3.1*   No results found for this basename: LIPASE, AMYLASE,  in the last 168 hours No results found for this basename: AMMONIA,  in the last 168 hours CBC:  Recent Labs Lab 11/15/12 2130 11/16/12 0450 11/17/12 0345 11/18/12 0530 11/19/12 0625  WBC 0.4* 0.3* 1.0* 5.5 9.4  NEUTROABS 0.1*  --  0.3* 4.0 7.8*  HGB 11.3* 9.8* 9.8* 9.9* 10.0*  HCT 33.0* 27.5* 28.3* 29.2* 28.9*  MCV 85.9 84.6 85.2 86.6 86.3  PLT 154 120* 119* 126* 128*   Cardiac Enzymes:  Recent Labs Lab 11/16/12 1105 11/16/12 1553 11/16/12 2206  TROPONINI <0.30 <0.30 <0.30   BNP: No components found with this basename: POCBNP,  CBG: No results found for this basename: GLUCAP,  in the last 168 hours  Recent Results (from the past 240 hour(s))  CULTURE, BLOOD (ROUTINE X 2)     Status: None   Collection Time    11/15/12 10:25 PM      Result Value Range Status   Specimen Description BLOOD RIGHT HAND   Final   Special Requests BOTTLES DRAWN AEROBIC AND ANAEROBIC 3CC   Final   Culture  Setup Time 11/16/2012 01:09   Final   Culture     Final   Value:        BLOOD CULTURE RECEIVED NO GROWTH TO DATE CULTURE WILL BE HELD FOR 5 DAYS BEFORE ISSUING A FINAL NEGATIVE REPORT   Report Status PENDING   Incomplete  CULTURE, BLOOD (ROUTINE X 2)     Status: None   Collection Time    11/15/12 10:30 PM      Result Value Range Status   Specimen Description BLOOD RIGHT ARM   Final   Special Requests BOTTLES DRAWN AEROBIC AND ANAEROBIC 5CC   Final   Culture   Setup Time 11/16/2012 02:20   Final   Culture     Final   Value:        BLOOD CULTURE RECEIVED NO GROWTH TO DATE CULTURE WILL BE HELD FOR 5 DAYS BEFORE ISSUING A FINAL NEGATIVE REPORT   Report Status PENDING   Incomplete  CULTURE, BLOOD (ROUTINE X 2)     Status: None   Collection Time    11/16/12  6:55 PM      Result Value Range Status   Specimen Description BLOOD ARM   Final   Special Requests BOTTLES DRAWN AEROBIC AND ANAEROBIC 10CC   Final   Culture  Setup Time 11/17/2012 01:48   Final   Culture     Final   Value:  BLOOD CULTURE RECEIVED NO GROWTH TO DATE CULTURE WILL BE HELD FOR 5 DAYS BEFORE ISSUING A FINAL NEGATIVE REPORT   Report Status PENDING   Incomplete  MRSA PCR SCREENING     Status: None   Collection Time    11/17/12  8:01 AM      Result Value Range Status   MRSA by PCR NEGATIVE  NEGATIVE Final   Comment:            The GeneXpert MRSA Assay (FDA     approved for NASAL specimens     only), is one component of a     comprehensive MRSA colonization     surveillance program. It is not     intended to diagnose MRSA     infection nor to guide or     monitor treatment for     MRSA infections.  CLOSTRIDIUM DIFFICILE BY PCR     Status: None   Collection Time    11/18/12  7:49 PM      Result Value Range Status   C difficile by pcr NEGATIVE  NEGATIVE Final     Scheduled Meds: . enoxaparin (LOVENOX) injection  50 mg Subcutaneous Q24H  . sodium chloride  3 mL Intravenous Q12H  . sodium chloride  3 mL Intravenous Q12H   Continuous Infusions: . sodium chloride 75 mL/hr at 11/18/12 1352     Danicka Hourihan, DO  [facility] Pager [phone]  If 7PM-7AM, please contact night-coverage [email or web address] Password [email or web address] 11/19/2012, 1:51 PM   LOS: 4 days

## 2012-11-19 NOTE — Progress Notes (Signed)
C diff neg and MRSA neg pt taken off isolation per protocol.

## 2012-11-19 NOTE — Progress Notes (Signed)
Bilateral lower extremity venous duplex:  No evidence of DVT, superficial thrombosis, or Baker's Cyst.   

## 2012-11-19 NOTE — Progress Notes (Signed)
IP PROGRESS NOTE  Subjective:   He feels better in general, but he continues to have a low-grade fever. No nausea. He developed loose stools over the weekend.   Objective: Vital signs in last 24 hours: Blood pressure 143/72, pulse 96, temperature 100.2 F (37.9 C), temperature source Oral, resp. rate 18, height 5' 10.08" (1.78 m), weight 223 lb 15.8 oz (101.6 kg), SpO2 95.00%.  Intake/Output from previous day: 04/06 0701 - 04/07 0700 In: 3291 [P.O.:960; I.V.:2331] Out: -   Physical Exam:  HEENT: No thrush or ulcers Lungs: Clear bilaterally Cardiac: Regular rate and rhythm Abdomen: Nontender, no hepatosplenomegaly, no mass Extremities: No leg edema   Portacath/PICC-without erythema or tenderness   Lab Results:  Recent Labs  11/18/12 0530 11/19/12 0625  WBC 5.5 9.4  HGB 9.9* 10.0*  HCT 29.2* 28.9*  PLT 126* 128*   ANC 7.8  BMET  Recent Labs  11/17/12 0345  NA 135  K 3.9  CL 100  CO2 28  GLUCOSE 106*  BUN 12  CREATININE 0.91  CALCIUM 8.3*    Studies/Results: No results found.  Medications: I have reviewed the patient's current medications.  Assessment/Plan:  1. Large abdominal mass and surrounding small lymph nodes status post CT-guided biopsy on 07/20/2012 with the pathology suggestive of non-Hodgkin's lymphoma. Status post laparoscopic biopsy of the left abdominal mass on 08/21/2011 with final pathology confirming a large B-cell lymphoma, CD20 positive, with both follicular and diffuse patterns. Negative staging bone marrow biopsy. Status post cycle 1 CHOP/Rituxan on 09/06/2012 , he completed cycle 4 on 11/08/2012 2. Nausea/vomiting-likely secondary chemotherapy despite Aloxi/emend/Decadron, resolved 3. Status post Port-A-Cath placement 08/30/2012. 4. Neutropenia following cycle 2 CHOP/Rituxan. Neulasta was added beginning with cycle 3. 5. Syncope event 11/15/2012-likely related to dehydration       6.   "Night sweats "-etiology unclear. He reports  the night sweats have improved       7.    pancytopenia secondary to chemotherapy, he received Neulasta on 11/09/2012 , the neutropenia has resolved       8.    Fever in the setting of neutropenia-maintained on broad-spectrum antibiotics without an apparent source for infection, persistent low-grade fever       9.    diarrhea-C. difficile toxin negative on 11/18/2012   He continues to have a low-grade fever despite recovery of the neutrophil count and broad-spectrum antibiotics. Cultures are negative. No apparent source for infection. I recommend proceeding with a restaging CT to be sure he does not have an abscess or "tumor "fever.  Recommendations:  1. Continue broad-spectrum antibiotics and follow blood cultures 2. proceed with restaging CTs or PET scan to assess the response to CHOP/rituximab and look for a source of recurrent fever.  He had previously been scheduled for a PET scan on 11/27/2012.    LOS: 4 days   Harjit Douds  11/19/2012, 9:29 AM

## 2012-11-20 ENCOUNTER — Inpatient Hospital Stay (HOSPITAL_COMMUNITY): Payer: BC Managed Care – PPO

## 2012-11-20 ENCOUNTER — Encounter (HOSPITAL_COMMUNITY): Payer: Self-pay | Admitting: Radiology

## 2012-11-20 DIAGNOSIS — R5381 Other malaise: Secondary | ICD-10-CM

## 2012-11-20 DIAGNOSIS — R5383 Other fatigue: Secondary | ICD-10-CM

## 2012-11-20 LAB — CBC WITH DIFFERENTIAL/PLATELET
Basophils Absolute: 0.1 10*3/uL (ref 0.0–0.1)
Lymphs Abs: 0.2 10*3/uL — ABNORMAL LOW (ref 0.7–4.0)
MCV: 86.1 fL (ref 78.0–100.0)
Monocytes Absolute: 1.3 10*3/uL — ABNORMAL HIGH (ref 0.1–1.0)
Monocytes Relative: 13 % — ABNORMAL HIGH (ref 3–12)
Platelets: 141 10*3/uL — ABNORMAL LOW (ref 150–400)
RDW: 15.2 % (ref 11.5–15.5)
WBC: 9.7 10*3/uL (ref 4.0–10.5)

## 2012-11-20 MED ORDER — IOHEXOL 300 MG/ML  SOLN
50.0000 mL | INTRAMUSCULAR | Status: AC
  Administered 2012-11-20 (×2): 25 mL via ORAL

## 2012-11-20 MED ORDER — ASPIRIN 325 MG PO TABS
650.0000 mg | ORAL_TABLET | Freq: Every day | ORAL | Status: DC
  Administered 2012-11-20 – 2012-11-27 (×8): 650 mg via ORAL
  Filled 2012-11-20 (×10): qty 2

## 2012-11-20 MED ORDER — IOHEXOL 300 MG/ML  SOLN
100.0000 mL | Freq: Once | INTRAMUSCULAR | Status: AC | PRN
  Administered 2012-11-20: 100 mL via INTRAVENOUS

## 2012-11-20 NOTE — Progress Notes (Signed)
IP PROGRESS NOTE  Subjective:   He continues to feel weak. He reports to soft stools yesterday. The low-grade fever and night sweats persist. No nausea   Objective: Vital signs in last 24 hours: Blood pressure 150/78, pulse 79, temperature 99.7 F (37.6 C), temperature source Oral, resp. rate 18, height 5' 10.08" (1.78 m), weight 223 lb 15.8 oz (101.6 kg), SpO2 92.00%.  Intake/Output from previous day: 04/07 0701 - 04/08 0700 In: 1675.3 [P.O.:744; I.V.:931.3] Out: -   Physical Exam:  HEENT: No thrush or ulcers Lungs: Clear bilaterally Cardiac: Regular rate and rhythm Abdomen: Nontender, no hepatosplenomegaly, no mass Extremities: No leg edema   Portacath/PICC-without erythema or tenderness   Lab Results:  Recent Labs  11/19/12 0625 11/20/12 0524  WBC 9.4 9.7  HGB 10.0* 10.0*  HCT 28.9* 29.1*  PLT 128* 141*   ANC 8.1  BMET No results found for this basename: NA, K, CL, CO2, GLUCOSE, BUN, CREATININE, CALCIUM,  in the last 72 hours  Studies/Results: No results found.  Medications: I have reviewed the patient's current medications.  Assessment/Plan:  1. Large abdominal mass and surrounding small lymph nodes status post CT-guided biopsy on 07/20/2012 with the pathology suggestive of non-Hodgkin's lymphoma. Status post laparoscopic biopsy of the left abdominal mass on 08/21/2011 with final pathology confirming a large B-cell lymphoma, CD20 positive, with both follicular and diffuse patterns. Negative staging bone marrow biopsy. Status post cycle 1 CHOP/Rituxan on 09/06/2012 , he completed cycle 4 on 11/08/2012 2. Nausea/vomiting-likely secondary chemotherapy despite Aloxi/emend/Decadron, resolved 3. Status post Port-A-Cath placement 08/30/2012. 4. Neutropenia following cycle 2 CHOP/Rituxan. Neulasta was added beginning with cycle 3. 5. Syncope event 11/15/2012-likely related to dehydration       6.   "Night sweats "-etiology unclear.       7.    pancytopenia  secondary to chemotherapy, he received Neulasta on 11/09/2012 , the neutropenia has resolved       8.    Fever in the setting of neutropenia-no apparent source for infection, cultures remain negative, now off of antibiotics       9.    diarrhea-C. difficile toxin negative on 11/18/2012   The low-grade fever and night sweats persist. I am concerned the fever is related to lymphoma or an unrecognized infection. His insurance will not pay for inpatient PET scan. We will proceed with restaging CT scans today..  Recommendations:  1. CT chest, abdomen, and pelvis today 2. Consider infectious disease consult if the CT does not reveal an explanation for the fever/sweats.      LOS: 5 days   Orma Cheetham, Jillyn Hidden  11/20/2012, 8:43 AM

## 2012-11-20 NOTE — Progress Notes (Signed)
[facility] PROGRESS NOTE  Adam Alvarado ZOX:[id number] DOB: 02/27/1956 DOA: 11/15/2012 PCP: [name], MD   Brief Summary 57 year old male with a history of large B-cell lymphoma diagnosed on a biopsy from an abdominal mass on 08/20/2012. The patient recently completed his fourth cycle of CHOP and Rituxan on 11/08/2012. He received Neulasta on 11/09/2012. He presented to the emergency department on 11/15/2012 due to a syncopal episode. The patient was talking with his neighbor during which time the patient had an episode of emesis associated with diaphoresis and then subsequently collapsed. The patient did not have any urine or bowel incontinence. There is no post ictal state. He is back to the emergency department where he was found to have white blood cell count of 300. Of note, the patient has had vomiting since Sunday, 11/11/2012, likely related to his chemotherapy. He is a low-grade temperatures at home with the highest temperature of 99.13F. After a syncopal episode, the patient was too weak to get up, and as a result EMS was activated. The patient was started on intravenous fluids. It was thought that his syncopal episode was likely due to volume depletion. The patient's orthostatics were positive. Troponins were negative. EKG was unremarkable. Echocardiogram on 08/31/2012 showed ejection fraction 60-65% with no wall motion abnormalities. CT of the brain was negative. The patient did not have any further dizziness or syncopal episodes. Regarding the patient's neutropenia, the patient's white blood cell count gradually increased throughout the hospitalization. The patient was started on empiric vancomycin and cefepime after blood and urine cultures were obtained. His cultures remained negative throughout the hospitalization. His initial absolute neutrophil count was 80 at the time of admission. Chest x-ray was negative and blood cultures are negative. There is no pyuria on his UA. The patient did  not have any temperatures greater than 100.54F during the hospitalization. After his surgical count increased to greater than 500, the patient's antibiotics were discontinued and the patient was observed clinically. There are no other signs of infection. Dr. Truett Perna continued to follow the patient throughout the hospitalization. On 11/18/2012, the patient's neutropenia resolved. His WBC count was 5500 total. The patient remained afebrile. He has not had any temperatures greater than 38.0C. Blood cultures remain negative. Vancomycin was discontinued on 11/18/2012. The patient did have loose stools. C. difficile PCR was checked and was negative. Blood cultures remained negative on 11/19/2012, and cefepime was discontinued. The patient was monitored off of antibiotics. The patient had a temperature of 100.70F on 11/20/2012. He has not received any Tylenol, and his temperature improved to 100.54F. Surveillance CT of the chest on 11/20/2012 showed improvement in the size of his prior prevascular mass with groundglass 0.5 cm left upper lobe nodule. CT abdomen and pelvis on 11/20/2012 showed considerable reduction in the size of his left upper quadrant mesenteric mass as well as decreased retroperitoneal lymph nodes.   Assessment/Plan: Syncope  -Suspect combination of vagal reaction and volume depletion  -Check orthostatics--positive on 11/16/12-->given IVF -Cycle troponins--neg  -EKG is reassuring  -Echocardiogram 08/31/2012--EF 60-65%, no WMA  -CT brain is negative  Low grade temperature  -No temperatures greater than 38.0C since admission until 11/20/12 when he had single temp of 100.70F which spontaneously resolved without any antipyretics -Cultures are negative  -Chest x-ray negative  -CT brain without any sinus disease  -Discontinue all antibiotics and observe--> occult infection will declare itself; may be drug fever  -Patient WBC recovered x 48 hrs (ANC >1000) and no temp > 38.0C during entire  admission  until 11/20/12 when he had single temp of 100.85F which spontaneously resolved without any antipyretics -Venous duplex lower extremities for DVT  -No recent travels to suggest exotic infection although patient has cats at home--patient does not change litter box  -May consider echocardiogram to rule out SBE if fever arises off of antibiotics  -Overall very low suspicion for occult infection suspect neoplastic fever with residual mass in his abdomen and lung nodules. -Clinically, patient has looked good and remains hemodynamically stable.  Neutropenia  -Tmax 100.1 during this hospitalization, No temps greater than 38.0C until 11/20/12 when he had single temp of 100.85F which spontaneously resolved without any antipyretics -resolved on 11/18/12 with WBC 5500  -Patient received Neulasta 11/09/2012  -Follow blood cultures from 11/15/12-->NEG to date  -No pyuria but the patient is neutropenic, no dysuria  -Chest x-ray is clear in the face of neutropenia  -plan to discontinue vanco  Loose stools  -Check C. difficile PCR-->neg  -Diarrhea has B-cell lymphoma  -Dr. Truett Perna has been notified of patient's hospitalization  -Fourth cycle CHOP/Rituxan on 11/08/12  -Appreciate oncology followup  Night sweats  - "B"-type symptoms vs occult infection  Family Communication: mother at beside  Disposition Plan: Home when ok with MedOnc  Antibiotics:  Vancomycin 11/16/2012>>> 11/18/2012  Cefepime 11/15/12            Procedures/Studies: Dg Chest 2 View  11/15/2012  *RADIOLOGY REPORT*  Clinical Data: Loss of consciousness.  Fever.  Lymphoma.  CHEST - 2 VIEW  Comparison: 12/01/2004  Findings: Lungs are clear.  Negative for pneumonia or effusion. Vascularity normal.  Port-A-Cath tip in the SVC. Negative for adenopathy or mass.  IMPRESSION: No active cardiopulmonary abnormality.  Negative for pneumonia.   Original Report Authenticated By: Janeece Riggers, M.D.    Ct Head Wo Contrast  11/15/2012   *RADIOLOGY REPORT*  Clinical Data: History of lymphoma.  Syncope.  CT HEAD WITHOUT CONTRAST  Technique:  Contiguous axial images were obtained from the base of the skull through the vertex without contrast.  Comparison: None.  Findings: No mass effect, midline shift, or acute intracranial hemorrhage.  Dilated perivascular space in the lower left basal ganglia.  Brain parenchyma and ventricles system are within normal limits.  Mastoid air cells are clear.  Visualized paranasal sinuses are clear.  IMPRESSION: No acute intracranial pathology.   Original Report Authenticated By: Jolaine Click, M.D.    Ct Chest W Contrast  11/20/2012  *RADIOLOGY REPORT*  Clinical Data:  Non-Hodgkins lymphoma.  Low grade fever.  Left upper quadrant abdominal mass.  CT CHEST, ABDOMEN AND PELVIS WITH CONTRAST  Technique:  Multidetector CT imaging of the chest, abdomen and pelvis was performed following the standard protocol during bolus administration of intravenous contrast.  Contrast: OMNIPAQUE IOHEXOL 300 MG/ML  SOLN  Comparison:  Multiple exams, including 09/05/2012  CT CHEST  Findings:  Prior prevascular mass revealed by fat planes to be to separate lymph nodes, improved in size, with the more medial lesion previously having a short axis diameter of 1.9 cm, currently 0.9 cm; more lateral noted previously having a short axis of 1.5 cm, currently 1.1 cm.  No other adenopathy in the chest.  Small hiatal hernia.  No significant vascular abnormality observed. Right Port-A-Cath tip:  Cavoatrial junction.  Ground-glass nodule, left upper lobe, 0.5 by 0.4 cm, not well shown on prior PET CT if present at all.  0.4 cm nodule in the left lower lobe, image 30 of series 4.  Mild subsegmental atelectasis in  the posterior basal segments of both lower lobes.  Very faint mosaic attenuation in the upper lobes, query air trapping.  IMPRESSION:  1.  Reduced size of the two adjacent prevascular lymph nodes. 2.  Small hiatal hernia. 3.  4 mm  ground-glass nodule in the left upper lobe; 4 mm potentially solid nodule in the left lower lobe. If the patient is at high risk for bronchogenic carcinoma, follow-up chest CT at 1 year is recommended.  If the patient is at low risk, no follow-up is needed.  This recommendation follows the consensus statement: Guidelines for Management of Small Pulmonary Nodules Detected on CT Scans:  A Statement from the Fleischner Society as published in Radiology 2005; 237:395-400. 4.  Faint to mosaic attenuation in the upper lobes, query air trapping.  CT ABDOMEN AND PELVIS  Findings:  The liver, spleen, pancreas, and adrenal glands appear unremarkable.  The gallbladder and biliary system appear unremarkable.  The kidneys appear unremarkable, as do the proximal ureters.  Left upper quadrant mesenteric mass partially involves the superior mesenteric artery and wraps around the left side of the superior mesenteric vein.  Mass currently measures 5.4 x 6.1 cm (formerly 11.4 x 11.2 cm) and demonstrates reduced density.  Appendix normal. No pathologic pelvic adenopathy is identified.  Urinary bladder unremarkable.  No dilated bowel. Left periaortic node short axis diameter 0.4 cm (formerly 1.1 cm).  IMPRESSION:  1.  Considerable reduction in size of the left upper quadrant mesenteric mass and in the retroperitoneal lymph node compared to prior.   Original Report Authenticated By: Gaylyn Rong, M.D.    Ct Abdomen Pelvis W Contrast  11/20/2012  *RADIOLOGY REPORT*  Clinical Data:  Non-Hodgkins lymphoma.  Low grade fever.  Left upper quadrant abdominal mass.  CT CHEST, ABDOMEN AND PELVIS WITH CONTRAST  Technique:  Multidetector CT imaging of the chest, abdomen and pelvis was performed following the standard protocol during bolus administration of intravenous contrast.  Contrast: OMNIPAQUE IOHEXOL 300 MG/ML  SOLN  Comparison:  Multiple exams, including 09/05/2012  CT CHEST  Findings:  Prior prevascular mass revealed by fat  planes to be to separate lymph nodes, improved in size, with the more medial lesion previously having a short axis diameter of 1.9 cm, currently 0.9 cm; more lateral noted previously having a short axis of 1.5 cm, currently 1.1 cm.  No other adenopathy in the chest.  Small hiatal hernia.  No significant vascular abnormality observed. Right Port-A-Cath tip:  Cavoatrial junction.  Ground-glass nodule, left upper lobe, 0.5 by 0.4 cm, not well shown on prior PET CT if present at all.  0.4 cm nodule in the left lower lobe, image 30 of series 4.  Mild subsegmental atelectasis in the posterior basal segments of both lower lobes.  Very faint mosaic attenuation in the upper lobes, query air trapping.  IMPRESSION:  1.  Reduced size of the two adjacent prevascular lymph nodes. 2.  Small hiatal hernia. 3.  4 mm ground-glass nodule in the left upper lobe; 4 mm potentially solid nodule in the left lower lobe. If the patient is at high risk for bronchogenic carcinoma, follow-up chest CT at 1 year is recommended.  If the patient is at low risk, no follow-up is needed.  This recommendation follows the consensus statement: Guidelines for Management of Small Pulmonary Nodules Detected on CT Scans:  A Statement from the Fleischner Society as published in Radiology 2005; 237:395-400. 4.  Faint to mosaic attenuation in the upper lobes, query air trapping.  CT ABDOMEN AND PELVIS  Findings:  The liver, spleen, pancreas, and adrenal glands appear unremarkable.  The gallbladder and biliary system appear unremarkable.  The kidneys appear unremarkable, as do the proximal ureters.  Left upper quadrant mesenteric mass partially involves the superior mesenteric artery and wraps around the left side of the superior mesenteric vein.  Mass currently measures 5.4 x 6.1 cm (formerly 11.4 x 11.2 cm) and demonstrates reduced density.  Appendix normal. No pathologic pelvic adenopathy is identified.  Urinary bladder unremarkable.  No dilated bowel. Left  periaortic node short axis diameter 0.4 cm (formerly 1.1 cm).  IMPRESSION:  1.  Considerable reduction in size of the left upper quadrant mesenteric mass and in the retroperitoneal lymph node compared to prior.   Original Report Authenticated By: Gaylyn Rong, M.D.          Subjective:  patient denies any chills, chest pain, shortness breath, nausea, vomiting, diarrhea, abdominal pain, dysuria, headache dizziness, visual changes, rashes, synovitis.  Objective: Filed Vitals:   11/20/12 1024 11/20/12 1122 11/20/12 1417 11/20/12 1833  BP:   136/68   Pulse:   86   Temp: 99.5 F (37.5 C) 100.1 F (37.8 C) 100.7 F (38.2 C) 100.2 F (37.9 C)  TempSrc: Oral Oral Oral Oral  Resp:   16   Height:      Weight:      SpO2:   92%     Intake/Output Summary (Last 24 hours) at 11/20/12 1907 Last data filed at 11/20/12 1834  Gross per 24 hour  Intake   1320 ml  Output    480 ml  Net    840 ml   Weight change:  Exam:   General:  Pt is alert, follows commands appropriately, not in acute distress  HEENT: No icterus, No thrush,  [place]/AT  Cardiovascular: RRR, S1/S2, no rubs, no gallops  Respiratory: CTA bilaterally, no wheezing, no crackles, no rhonchi  Abdomen: Soft/+BS, non tender, non distended, no guarding  Extremities: No edema, No lymphangitis, No petechiae, No rashes, no synovitis  Data Reviewed: Basic Metabolic Panel:  Recent Labs Lab 11/15/12 2130 11/16/12 0450 11/17/12 0345  NA 137 132* 135  K 4.3 4.1 3.9  CL 100 99 100  CO2 30 26 28   GLUCOSE 125* 114* 106*  BUN 17 14 12   CREATININE 1.02 0.84 0.91  CALCIUM 8.5 8.1* 8.3*   Liver Function Tests:  Recent Labs Lab 11/15/12 2130  AST 20  ALT 42  ALKPHOS 75  BILITOT 0.3  PROT 6.0  ALBUMIN 3.1*   No results found for this basename: LIPASE, AMYLASE,  in the last 168 hours No results found for this basename: AMMONIA,  in the last 168 hours CBC:  Recent Labs Lab 11/15/12 2130 11/16/12 0450  11/17/12 0345 11/18/12 0530 11/19/12 0625 11/20/12 0524  WBC 0.4* 0.3* 1.0* 5.5 9.4 9.7  NEUTROABS 0.1*  --  0.3* 4.0 7.8* 8.1*  HGB 11.3* 9.8* 9.8* 9.9* 10.0* 10.0*  HCT 33.0* 27.5* 28.3* 29.2* 28.9* 29.1*  MCV 85.9 84.6 85.2 86.6 86.3 86.1  PLT 154 120* 119* 126* 128* 141*   Cardiac Enzymes:  Recent Labs Lab 11/16/12 1105 11/16/12 1553 11/16/12 2206  TROPONINI <0.30 <0.30 <0.30   BNP: No components found with this basename: POCBNP,  CBG: No results found for this basename: GLUCAP,  in the last 168 hours  Recent Results (from the past 240 hour(s))  CULTURE, BLOOD (ROUTINE X 2)     Status: None   Collection  Time    11/15/12 10:25 PM      Result Value Range Status   Specimen Description BLOOD RIGHT HAND   Final   Special Requests BOTTLES DRAWN AEROBIC AND ANAEROBIC 3CC   Final   Culture  Setup Time 11/16/2012 01:09   Final   Culture     Final   Value:        BLOOD CULTURE RECEIVED NO GROWTH TO DATE CULTURE WILL BE HELD FOR 5 DAYS BEFORE ISSUING A FINAL NEGATIVE REPORT   Report Status PENDING   Incomplete  CULTURE, BLOOD (ROUTINE X 2)     Status: None   Collection Time    11/15/12 10:30 PM      Result Value Range Status   Specimen Description BLOOD RIGHT ARM   Final   Special Requests BOTTLES DRAWN AEROBIC AND ANAEROBIC 5CC   Final   Culture  Setup Time 11/16/2012 02:20   Final   Culture     Final   Value:        BLOOD CULTURE RECEIVED NO GROWTH TO DATE CULTURE WILL BE HELD FOR 5 DAYS BEFORE ISSUING A FINAL NEGATIVE REPORT   Report Status PENDING   Incomplete  CULTURE, BLOOD (ROUTINE X 2)     Status: None   Collection Time    11/16/12  6:55 PM      Result Value Range Status   Specimen Description BLOOD ARM   Final   Special Requests BOTTLES DRAWN AEROBIC AND ANAEROBIC 10CC   Final   Culture  Setup Time 11/17/2012 01:48   Final   Culture     Final   Value:        BLOOD CULTURE RECEIVED NO GROWTH TO DATE CULTURE WILL BE HELD FOR 5 DAYS BEFORE ISSUING A FINAL  NEGATIVE REPORT   Report Status PENDING   Incomplete  MRSA PCR SCREENING     Status: None   Collection Time    11/17/12  8:01 AM      Result Value Range Status   MRSA by PCR NEGATIVE  NEGATIVE Final   Comment:            The GeneXpert MRSA Assay (FDA     approved for NASAL specimens     only), is one component of a     comprehensive MRSA colonization     surveillance program. It is not     intended to diagnose MRSA     infection nor to guide or     monitor treatment for     MRSA infections.  CLOSTRIDIUM DIFFICILE BY PCR     Status: None   Collection Time    11/18/12  7:49 PM      Result Value Range Status   C difficile by pcr NEGATIVE  NEGATIVE Final     Scheduled Meds: . enoxaparin (LOVENOX) injection  50 mg Subcutaneous Q24H  . sodium chloride  3 mL Intravenous Q12H  . sodium chloride  3 mL Intravenous Q12H   Continuous Infusions: . sodium chloride 75 mL/hr at 11/20/12 0638     Zian Mohamed, DO  [facility] Pager [phone]  If 7PM-7AM, please contact night-coverage [email or web address] Password [email or web address] 11/20/2012, 7:07 PM   LOS: 5 days

## 2012-11-21 DIAGNOSIS — D649 Anemia, unspecified: Secondary | ICD-10-CM | POA: Diagnosis present

## 2012-11-21 DIAGNOSIS — R197 Diarrhea, unspecified: Secondary | ICD-10-CM | POA: Diagnosis present

## 2012-11-21 DIAGNOSIS — D61810 Antineoplastic chemotherapy induced pancytopenia: Secondary | ICD-10-CM | POA: Diagnosis present

## 2012-11-21 DIAGNOSIS — R509 Fever, unspecified: Secondary | ICD-10-CM | POA: Diagnosis present

## 2012-11-21 DIAGNOSIS — T451X5A Adverse effect of antineoplastic and immunosuppressive drugs, initial encounter: Secondary | ICD-10-CM | POA: Diagnosis present

## 2012-11-21 DIAGNOSIS — R112 Nausea with vomiting, unspecified: Secondary | ICD-10-CM | POA: Diagnosis present

## 2012-11-21 LAB — URINALYSIS, ROUTINE W REFLEX MICROSCOPIC
Hgb urine dipstick: NEGATIVE
Leukocytes, UA: NEGATIVE
Specific Gravity, Urine: 1.023 (ref 1.005–1.030)
Urobilinogen, UA: 0.2 mg/dL (ref 0.0–1.0)

## 2012-11-21 LAB — CBC WITH DIFFERENTIAL/PLATELET
Eosinophils Absolute: 0 10*3/uL (ref 0.0–0.7)
MCH: 29.1 pg (ref 26.0–34.0)
MCHC: 33.7 g/dL (ref 30.0–36.0)
Monocytes Absolute: 1.4 10*3/uL — ABNORMAL HIGH (ref 0.1–1.0)
Neutrophils Relative %: 82 % — ABNORMAL HIGH (ref 43–77)
Platelets: 161 10*3/uL (ref 150–400)

## 2012-11-21 LAB — COMPREHENSIVE METABOLIC PANEL
AST: 23 U/L (ref 0–37)
Albumin: 2.8 g/dL — ABNORMAL LOW (ref 3.5–5.2)
Calcium: 8.7 mg/dL (ref 8.4–10.5)
Creatinine, Ser: 0.87 mg/dL (ref 0.50–1.35)
GFR calc non Af Amer: >90 mL/min (ref >90)

## 2012-11-21 LAB — URINE MICROSCOPIC-ADD ON

## 2012-11-21 MED ORDER — ASPIRIN 325 MG PO TABS
650.0000 mg | ORAL_TABLET | Freq: Four times a day (QID) | ORAL | Status: DC | PRN
  Administered 2012-11-21 – 2012-11-26 (×14): 650 mg via ORAL
  Filled 2012-11-21 (×12): qty 2

## 2012-11-21 NOTE — Progress Notes (Signed)
[facility] PROGRESS NOTE  GIBSON LAD ZOX:[id number] DOB: [birth year] DOA: 11/15/2012 PCP: [name], MD  Assessment/Plan: #1 neutropenic fever/fever Patient initially was noted to have low-grade temperatures on admission however yesterday still spiking temperatures up to 102.4 last night. Urinalysis is negative. Urine cultures have been negative. Blood cultures with no growth to date. CT of the abdomen and pelvis negative for any source of infection. Patient was on IV vancomycin and cefepime for approximately 3 days which was subsequently discontinued. Patient with ongoing fevers. ID consultation pending for further evaluation and management.  #2 syncope Likely vasovagal in the setting of dehydration/orthostasis. Patient was noted to be orthostatic on 11/16/2012 and hydrate with IV fluids. Cardiac enzymes have been negative. EKG is reassuring. 2-D echo in the EF of 60-65% with no wall motion abnormalities from 08/31/2012. CT of the head was negative. No further episodes of syncope. Follow.  #3 diarrhea Improved. Patient states had a solid bowel movement today. C. difficile PCR has been negative.  #4 pancytopenia secondary to chemotherapy Patient received the last on 11/09/2012. Neutropenia thrombocytopenia have improved. Follow.  #5 night sweats questionable Questionable etiology. Follow. May be secondary to be symptoms versus occult infection. Follow.  #6 B-cell lymphoma large abdominal mass and surrounding small lymph nodes status post CT-guided biopsy on 07/20/2012 suggestive of non-Hodgkin's lymphoma. Status post laparoscopic biopsy of left abdominal mass 08/21/2011 with final pathology confirming a large B-cell lymphoma, CD20 positive with both follicular and diffuse patterns. Bone marrow biopsy was negative staging. Patient is status post cycle 1 CHOP/Rituxan on 09/06/2012, he completed cycle 4 11/09/2002 2. Restaging CT scans on 11/20/2012 confirming decreasing chest/abdominal  lymphadenopathy and large mesenteric mass. Per oncology.  #7 nausea and vomiting Secondary to chemotherapy. Improved. Continue supportive care.  #8 status post Port-A-Cath placement 08/30/2012  Code Status: Stable. Family Communication: Updated patient and wife at bedside Disposition Plan: Home when medically stable   Consultants:  Hematology oncology: Dr. Gaylyn Rong 11/17/12  Infectious diseases: Dr. Orvan Falconer 11/21/2012  Procedures:  CT chest and abdomen 11/20/2012  CT of the head 11/15/2012  Chest x-ray of 11/15/2012  Lower extremity Dopplers 11/19/2012  Antibiotics:  IV vancomycin for 4/4/ 2004 to 11/18/2012  IV cefepime 11/15/2012 to  11/19/12  HPI/Subjective: Patient states early this morning had fever with temperatures as high as 102 and right ears and chills as well as significant diaphoresis such that he was drenched and it. Patient states that he feels a little bit better this afternoon. Patient states had a normal bowel movement today.  Objective: Filed Vitals:   11/21/12 0952 11/21/12 1108 11/21/12 1130 11/21/12 1358  BP:    128/73  Pulse:    66  Temp: 100.5 F (38.1 C) 99.4 F (37.4 C) 99.4 F (37.4 C) 98.5 F (36.9 C)  TempSrc: Oral  Oral Oral  Resp:    18  Height:      Weight:      SpO2:    96%    Intake/Output Summary (Last 24 hours) at 11/21/12 1514 Last data filed at 11/20/12 1900  Gross per 24 hour  Intake 1407.5 ml  Output    480 ml  Net  927.5 ml   Filed Weights   11/15/12 2113  Weight: 101.6 kg (223 lb 15.8 oz)    Exam:   General:  NAD  Cardiovascular: RRR  Respiratory: CTAB  Abdomen: Soft/NT/ND/+BS  Musculoskeletal: 5/5 bue STRENGTH, 5/5 ble STRENGTH  Extremities: No clubbing cyanosis or edema   Data Reviewed: Basic Metabolic  Panel:  Recent Labs Lab 11/15/12 2130 11/16/12 0450 11/17/12 0345 11/21/12 0500  NA 137 132* 135 137  K 4.3 4.1 3.9 4.1  CL 100 99 100 101  CO2 30 26 28 29   GLUCOSE 125* 114* 106* 101*  BUN  17 14 12 9   CREATININE 1.02 0.84 0.91 0.87  CALCIUM 8.5 8.1* 8.3* 8.7   Liver Function Tests:  Recent Labs Lab 11/15/12 2130 11/21/12 0500  AST 20 23  ALT 42 35  ALKPHOS 75 77  BILITOT 0.3 0.2*  PROT 6.0 5.6*  ALBUMIN 3.1* 2.8*   No results found for this basename: LIPASE, AMYLASE,  in the last 168 hours No results found for this basename: AMMONIA,  in the last 168 hours CBC:  Recent Labs Lab 11/17/12 0345 11/18/12 0530 11/19/12 0625 11/20/12 0524 11/21/12 0500  WBC 1.0* 5.5 9.4 9.7 9.2  NEUTROABS 0.3* 4.0 7.8* 8.1* 7.5  HGB 9.8* 9.9* 10.0* 10.0* 10.2*  HCT 28.3* 29.2* 28.9* 29.1* 30.3*  MCV 85.2 86.6 86.3 86.1 86.3  PLT 119* 126* 128* 141* 161   Cardiac Enzymes:  Recent Labs Lab 11/16/12 1105 11/16/12 1553 11/16/12 2206  TROPONINI <0.30 <0.30 <0.30   BNP (last 3 results) No results found for this basename: PROBNP,  in the last 8760 hours CBG: No results found for this basename: GLUCAP,  in the last 168 hours  Recent Results (from the past 240 hour(s))  CULTURE, BLOOD (ROUTINE X 2)     Status: None   Collection Time    11/15/12 10:25 PM      Result Value Range Status   Specimen Description BLOOD RIGHT HAND   Final   Special Requests BOTTLES DRAWN AEROBIC AND ANAEROBIC 3CC   Final   Culture  Setup Time 11/16/2012 01:09   Final   Culture     Final   Value:        BLOOD CULTURE RECEIVED NO GROWTH TO DATE CULTURE WILL BE HELD FOR 5 DAYS BEFORE ISSUING A FINAL NEGATIVE REPORT   Report Status PENDING   Incomplete  CULTURE, BLOOD (ROUTINE X 2)     Status: None   Collection Time    11/15/12 10:30 PM      Result Value Range Status   Specimen Description BLOOD RIGHT ARM   Final   Special Requests BOTTLES DRAWN AEROBIC AND ANAEROBIC 5CC   Final   Culture  Setup Time 11/16/2012 02:20   Final   Culture     Final   Value:        BLOOD CULTURE RECEIVED NO GROWTH TO DATE CULTURE WILL BE HELD FOR 5 DAYS BEFORE ISSUING A FINAL NEGATIVE REPORT   Report Status PENDING    Incomplete  CULTURE, BLOOD (ROUTINE X 2)     Status: None   Collection Time    11/16/12  6:55 PM      Result Value Range Status   Specimen Description BLOOD ARM   Final   Special Requests BOTTLES DRAWN AEROBIC AND ANAEROBIC 10CC   Final   Culture  Setup Time 11/17/2012 01:48   Final   Culture     Final   Value:        BLOOD CULTURE RECEIVED NO GROWTH TO DATE CULTURE WILL BE HELD FOR 5 DAYS BEFORE ISSUING A FINAL NEGATIVE REPORT   Report Status PENDING   Incomplete  MRSA PCR SCREENING     Status: None   Collection Time    11/17/12  8:01 AM  Result Value Range Status   MRSA by PCR NEGATIVE  NEGATIVE Final   Comment:            The GeneXpert MRSA Assay (FDA     approved for NASAL specimens     only), is one component of a     comprehensive MRSA colonization     surveillance program. It is not     intended to diagnose MRSA     infection nor to guide or     monitor treatment for     MRSA infections.  CLOSTRIDIUM DIFFICILE BY PCR     Status: None   Collection Time    11/18/12  7:49 PM      Result Value Range Status   C difficile by pcr NEGATIVE  NEGATIVE Final     Studies: Ct Chest W Contrast  11/20/2012  *RADIOLOGY REPORT*  Clinical Data:  Non-Hodgkins lymphoma.  Low grade fever.  Left upper quadrant abdominal mass.  CT CHEST, ABDOMEN AND PELVIS WITH CONTRAST  Technique:  Multidetector CT imaging of the chest, abdomen and pelvis was performed following the standard protocol during bolus administration of intravenous contrast.  Contrast: OMNIPAQUE IOHEXOL 300 MG/ML  SOLN  Comparison:  Multiple exams, including 09/05/2012  CT CHEST  Findings:  Prior prevascular mass revealed by fat planes to be to separate lymph nodes, improved in size, with the more medial lesion previously having a short axis diameter of 1.9 cm, currently 0.9 cm; more lateral noted previously having a short axis of 1.5 cm, currently 1.1 cm.  No other adenopathy in the chest.  Small hiatal hernia.  No  significant vascular abnormality observed. Right Port-A-Cath tip:  Cavoatrial junction.  Ground-glass nodule, left upper lobe, 0.5 by 0.4 cm, not well shown on prior PET CT if present at all.  0.4 cm nodule in the left lower lobe, image 30 of series 4.  Mild subsegmental atelectasis in the posterior basal segments of both lower lobes.  Very faint mosaic attenuation in the upper lobes, query air trapping.  IMPRESSION:  1.  Reduced size of the two adjacent prevascular lymph nodes. 2.  Small hiatal hernia. 3.  4 mm ground-glass nodule in the left upper lobe; 4 mm potentially solid nodule in the left lower lobe. If the patient is at high risk for bronchogenic carcinoma, follow-up chest CT at 1 year is recommended.  If the patient is at low risk, no follow-up is needed.  This recommendation follows the consensus statement: Guidelines for Management of Small Pulmonary Nodules Detected on CT Scans:  A Statement from the Fleischner Society as published in Radiology 2005; 237:395-400. 4.  Faint to mosaic attenuation in the upper lobes, query air trapping.  CT ABDOMEN AND PELVIS  Findings:  The liver, spleen, pancreas, and adrenal glands appear unremarkable.  The gallbladder and biliary system appear unremarkable.  The kidneys appear unremarkable, as do the proximal ureters.  Left upper quadrant mesenteric mass partially involves the superior mesenteric artery and wraps around the left side of the superior mesenteric vein.  Mass currently measures 5.4 x 6.1 cm (formerly 11.4 x 11.2 cm) and demonstrates reduced density.  Appendix normal. No pathologic pelvic adenopathy is identified.  Urinary bladder unremarkable.  No dilated bowel. Left periaortic node short axis diameter 0.4 cm (formerly 1.1 cm).  IMPRESSION:  1.  Considerable reduction in size of the left upper quadrant mesenteric mass and in the retroperitoneal lymph node compared to prior.   Original Report Authenticated By:  Gaylyn Rong, M.D.    Ct Abdomen Pelvis  W Contrast  11/20/2012  *RADIOLOGY REPORT*  Clinical Data:  Non-Hodgkins lymphoma.  Low grade fever.  Left upper quadrant abdominal mass.  CT CHEST, ABDOMEN AND PELVIS WITH CONTRAST  Technique:  Multidetector CT imaging of the chest, abdomen and pelvis was performed following the standard protocol during bolus administration of intravenous contrast.  Contrast: OMNIPAQUE IOHEXOL 300 MG/ML  SOLN  Comparison:  Multiple exams, including 09/05/2012  CT CHEST  Findings:  Prior prevascular mass revealed by fat planes to be to separate lymph nodes, improved in size, with the more medial lesion previously having a short axis diameter of 1.9 cm, currently 0.9 cm; more lateral noted previously having a short axis of 1.5 cm, currently 1.1 cm.  No other adenopathy in the chest.  Small hiatal hernia.  No significant vascular abnormality observed. Right Port-A-Cath tip:  Cavoatrial junction.  Ground-glass nodule, left upper lobe, 0.5 by 0.4 cm, not well shown on prior PET CT if present at all.  0.4 cm nodule in the left lower lobe, image 30 of series 4.  Mild subsegmental atelectasis in the posterior basal segments of both lower lobes.  Very faint mosaic attenuation in the upper lobes, query air trapping.  IMPRESSION:  1.  Reduced size of the two adjacent prevascular lymph nodes. 2.  Small hiatal hernia. 3.  4 mm ground-glass nodule in the left upper lobe; 4 mm potentially solid nodule in the left lower lobe. If the patient is at high risk for bronchogenic carcinoma, follow-up chest CT at 1 year is recommended.  If the patient is at low risk, no follow-up is needed.  This recommendation follows the consensus statement: Guidelines for Management of Small Pulmonary Nodules Detected on CT Scans:  A Statement from the Fleischner Society as published in Radiology 2005; 237:395-400. 4.  Faint to mosaic attenuation in the upper lobes, query air trapping.  CT ABDOMEN AND PELVIS  Findings:  The liver, spleen, pancreas, and adrenal  glands appear unremarkable.  The gallbladder and biliary system appear unremarkable.  The kidneys appear unremarkable, as do the proximal ureters.  Left upper quadrant mesenteric mass partially involves the superior mesenteric artery and wraps around the left side of the superior mesenteric vein.  Mass currently measures 5.4 x 6.1 cm (formerly 11.4 x 11.2 cm) and demonstrates reduced density.  Appendix normal. No pathologic pelvic adenopathy is identified.  Urinary bladder unremarkable.  No dilated bowel. Left periaortic node short axis diameter 0.4 cm (formerly 1.1 cm).  IMPRESSION:  1.  Considerable reduction in size of the left upper quadrant mesenteric mass and in the retroperitoneal lymph node compared to prior.   Original Report Authenticated By: Gaylyn Rong, M.D.     Scheduled Meds: . aspirin  650 mg Oral Daily  . enoxaparin (LOVENOX) injection  50 mg Subcutaneous Q24H  . sodium chloride  3 mL Intravenous Q12H  . sodium chloride  3 mL Intravenous Q12H   Continuous Infusions: . sodium chloride 75 mL/hr at 11/21/12 1128    Principal Problem:   Febrile neutropenia Active Problems:   Abdominal mass, LUQ (left upper quadrant)   Other malignant lymphomas, unspecified site, extranodal and solid organ sites   Syncope   Hyponatremia   Fever, unspecified   Anemia   Nausea with vomiting   Diarrhea   Antineoplastic chemotherapy induced pancytopenia    Time spent: > 35 mins    [facility]  [facility] Pager [phone]. If 7PM-7AM, please  contact night-coverage at [email or web address], password [facility] 11/21/2012, 3:14 PM  LOS: 6 days

## 2012-11-21 NOTE — Progress Notes (Signed)
Upon arrival onto the shift tonight I found the patient's wife who advised me of the  Patients latest temperature of 102.0 orally, I paged MD and gave patient 650mg  Tylenol to help reduce the fever, 30 minutes later I rechecked the temperature and it had increased to 103.0 orally after the patient had ingested a large soda with ice, and 102.9 axillary.  I placed ice packs in the axillary region and pubic region of the patient to help reduce the fever.  Patient's last temperature was 99.3 at 2208, will continue to monitor patient

## 2012-11-21 NOTE — Consult Note (Signed)
[facility]    Date of Admission:  11/15/2012    vancomycin and cefepime April 4-7       Reason for Consult: Persistent unexplained fever    Referring Physician: Dr. Charna Busman  Principal Problem:   Fever Active Problems:   Syncope   Hyponatremia   Nausea with vomiting   Diarrhea   Non-Hodgkin lymphoma of intra-abdominal lymph nodes   Anemia   Antineoplastic chemotherapy induced pancytopenia   . aspirin  650 mg Oral Daily  . enoxaparin (LOVENOX) injection  50 mg Subcutaneous Q24H  . sodium chloride  3 mL Intravenous Q12H  . sodium chloride  3 mL Intravenous Q12H    Recommendations: 1. Agree with continued observation off of antibiotics   Assessment: I am not certain what is causing his persistent fevers but I do not see any convincing evidence of infection. I would not remove his Port-A-Cath at this point as he has negative blood cultures and no clinical evidence of infection around the port. It seems as though his fevers have actually gone higher after stopping the antibiotics recently but he actually looks very good clinically. At this point I favor observation off of antibiotics and wait and see how he responds to a trial of antipyretics. Although he seems to have tolerated chemotherapy well up to this point, I wonder if the fever could be an adverse reaction to chemotherapy. I will follow closely with you.   HPI: Adam Alvarado is a 57 y.o. male completed his fourth cycle of CHOP-rituximab for B-cell non-Hodgkin's lymphoma 2 weeks ago. He states he felt worse after completing her fourth cycle that he had on any of the previous cycles. He had some nausea and began to develop low-grade fevers. Over the next week the fevers began to go higher and were associated with drenching sweats. His also has intermittent diarrhea. One week ago he was walking outside when he became very diaphoretic and nauseated and passed out in his front yard. A  neighbor rushed to his assistance and he was brought to the hospital. He has continued to have low-grade fevers over the past week while on empiric antibiotics. Antibiotics were stopped 2 days ago after all blood cultures and other diagnostic evaluation for infection returned negative. His temperatures have actually increased since stopping antibiotics reaching a high of 102.4. CT scan shows significant decrease in his left upper quadrant and his enteric lymphoma without any evidence of tumor necrosis or abscess. No other evidence of infection has been found with a head CT scan, chest x-ray, urinalysis or C. difficile PCR. He states that he feels much better today after starting the aspirin and acetaminophen yesterday.   Review of Systems: Constitutional: positive for fatigue, fevers and sweats, negative for anorexia and weight loss Eyes: negative Ears, nose, mouth, throat, and face: negative Respiratory: negative Cardiovascular: negative Gastrointestinal: positive for change in bowel habits, constipation, diarrhea, nausea, vomiting and Intermittent hemorrhoidal bleeding, negative for abdominal pain, dyspepsia, dysphagia and odynophagia Genitourinary:negative Integument/breast: negative for skin lesion(s) Hematologic/lymphatic: negative Musculoskeletal:negative Neurological: negative  Past Medical History  Diagnosis Date  . Cancer 2013    Non-Hodgkins Lymphoma  . Other malignant lymphomas, unspecified site, extranodal and solid organ sites 2013  . Hemorrhoid   . H/O hiatal hernia     small seen on CT Scan    History  Substance Use Topics  . Smoking status: Never Smoker   . Smokeless tobacco:  Never Used  . Alcohol Use: Yes     Comment: on occassion    Family History  Problem Relation Age of Onset  . Cancer Mother     Breast   No Known Allergies  OBJECTIVE: Blood pressure 128/73, pulse 66, temperature 98.5 F (36.9 C), temperature source Oral, resp. rate 18, height 5' 10.08"  (1.78 m), weight 101.6 kg (223 lb 15.8 oz), SpO2 96.00%. General: He is alert and in good spirits Skin: He is bald. He has no rash. His right upper chest Port-A-Cath site appears normal. Mouth: Clear without lesions. Teeth in good condition.  Eyes: Normal Lungs: Clear  Cor: Regular S1 and S2 no murmurs  Abdomen: Soft and nontender without palpable masses Joints and extremities: Normal  Microbiology: Recent Results (from the past 240 hour(s))  CULTURE, BLOOD (ROUTINE X 2)     Status: None   Collection Time    11/15/12 10:25 PM      Result Value Range Status   Specimen Description BLOOD RIGHT HAND   Final   Special Requests BOTTLES DRAWN AEROBIC AND ANAEROBIC 3CC   Final   Culture  Setup Time 11/16/2012 01:09   Final   Culture     Final   Value:        BLOOD CULTURE RECEIVED NO GROWTH TO DATE CULTURE WILL BE HELD FOR 5 DAYS BEFORE ISSUING A FINAL NEGATIVE REPORT   Report Status PENDING   Incomplete  CULTURE, BLOOD (ROUTINE X 2)     Status: None   Collection Time    11/15/12 10:30 PM      Result Value Range Status   Specimen Description BLOOD RIGHT ARM   Final   Special Requests BOTTLES DRAWN AEROBIC AND ANAEROBIC 5CC   Final   Culture  Setup Time 11/16/2012 02:20   Final   Culture     Final   Value:        BLOOD CULTURE RECEIVED NO GROWTH TO DATE CULTURE WILL BE HELD FOR 5 DAYS BEFORE ISSUING A FINAL NEGATIVE REPORT   Report Status PENDING   Incomplete  CULTURE, BLOOD (ROUTINE X 2)     Status: None   Collection Time    11/16/12  6:55 PM      Result Value Range Status   Specimen Description BLOOD ARM   Final   Special Requests BOTTLES DRAWN AEROBIC AND ANAEROBIC 10CC   Final   Culture  Setup Time 11/17/2012 01:48   Final   Culture     Final   Value:        BLOOD CULTURE RECEIVED NO GROWTH TO DATE CULTURE WILL BE HELD FOR 5 DAYS BEFORE ISSUING A FINAL NEGATIVE REPORT   Report Status PENDING   Incomplete  MRSA PCR SCREENING     Status: None   Collection Time    11/17/12  8:01  AM      Result Value Range Status   MRSA by PCR NEGATIVE  NEGATIVE Final   Comment:            The GeneXpert MRSA Assay (FDA     approved for NASAL specimens     only), is one component of a     comprehensive MRSA colonization     surveillance program. It is not     intended to diagnose MRSA     infection nor to guide or     monitor treatment for     MRSA infections.  CLOSTRIDIUM DIFFICILE BY PCR  Status: None   Collection Time    11/18/12  7:49 PM      Result Value Range Status   C difficile by pcr NEGATIVE  NEGATIVE Final    [name], MD [facility] [phone] pager   [phone] cell 11/21/2012, 3:53 PM

## 2012-11-21 NOTE — Progress Notes (Signed)
IP PROGRESS NOTE  Subjective:   He had a fever and vomited yesterday evening. He had a fever and a rigors early this morning.   Objective: Vital signs in last 24 hours: Blood pressure 160/74, pulse 80, temperature 101 F (38.3 C), temperature source Oral, resp. rate 18, height 5' 10.08" (1.78 m), weight 223 lb 15.8 oz (101.6 kg), SpO2 94.00%.  Intake/Output from previous day: 04/08 0701 - 04/09 0700 In: 2247.5 [P.O.:1320; I.V.:927.5] Out: 480 [Emesis/NG output:480]  Physical Exam:  HEENT: No thrush or ulcers Lungs: Clear bilaterally Cardiac: Regular rate and rhythm Abdomen: Nontender, no hepatosplenomegaly, no mass Extremities: No leg edema Skin: Faint blanching erythema diffusely over the trunk, he feels warm   Portacath/PICC-without erythema or tenderness   Lab Results:  Recent Labs  11/20/12 0524 11/21/12 0500  WBC 9.7 9.2  HGB 10.0* 10.2*  HCT 29.1* 30.3*  PLT 141* 161   ANC 7.5  BMET  Recent Labs  11/21/12 0500  NA 137  K 4.1  CL 101  CO2 29  GLUCOSE 101*  BUN 9  CREATININE 0.87  CALCIUM 8.7   LDH 217 Studies/Results: Ct Chest W Contrast  11/20/2012  *RADIOLOGY REPORT*  Clinical Data:  Non-Hodgkins lymphoma.  Low grade fever.  Left upper quadrant abdominal mass.  CT CHEST, ABDOMEN AND PELVIS WITH CONTRAST  Technique:  Multidetector CT imaging of the chest, abdomen and pelvis was performed following the standard protocol during bolus administration of intravenous contrast.  Contrast: OMNIPAQUE IOHEXOL 300 MG/ML  SOLN  Comparison:  Multiple exams, including 09/05/2012  CT CHEST  Findings:  Prior prevascular mass revealed by fat planes to be to separate lymph nodes, improved in size, with the more medial lesion previously having a short axis diameter of 1.9 cm, currently 0.9 cm; more lateral noted previously having a short axis of 1.5 cm, currently 1.1 cm.  No other adenopathy in the chest.  Small hiatal hernia.  No significant vascular abnormality  observed. Right Port-A-Cath tip:  Cavoatrial junction.  Ground-glass nodule, left upper lobe, 0.5 by 0.4 cm, not well shown on prior PET CT if present at all.  0.4 cm nodule in the left lower lobe, image 30 of series 4.  Mild subsegmental atelectasis in the posterior basal segments of both lower lobes.  Very faint mosaic attenuation in the upper lobes, query air trapping.  IMPRESSION:  1.  Reduced size of the two adjacent prevascular lymph nodes. 2.  Small hiatal hernia. 3.  4 mm ground-glass nodule in the left upper lobe; 4 mm potentially solid nodule in the left lower lobe. If the patient is at high risk for bronchogenic carcinoma, follow-up chest CT at 1 year is recommended.  If the patient is at low risk, no follow-up is needed.  This recommendation follows the consensus statement: Guidelines for Management of Small Pulmonary Nodules Detected on CT Scans:  A Statement from the Fleischner Society as published in Radiology 2005; 237:395-400. 4.  Faint to mosaic attenuation in the upper lobes, query air trapping.  CT ABDOMEN AND PELVIS  Findings:  The liver, spleen, pancreas, and adrenal glands appear unremarkable.  The gallbladder and biliary system appear unremarkable.  The kidneys appear unremarkable, as do the proximal ureters.  Left upper quadrant mesenteric mass partially involves the superior mesenteric artery and wraps around the left side of the superior mesenteric vein.  Mass currently measures 5.4 x 6.1 cm (formerly 11.4 x 11.2 cm) and demonstrates reduced density.  Appendix normal. No pathologic pelvic  adenopathy is identified.  Urinary bladder unremarkable.  No dilated bowel. Left periaortic node short axis diameter 0.4 cm (formerly 1.1 cm).  IMPRESSION:  1.  Considerable reduction in size of the left upper quadrant mesenteric mass and in the retroperitoneal lymph node compared to prior.   Original Report Authenticated By: Gaylyn Rong, M.D.    Ct Abdomen Pelvis W Contrast  11/20/2012   *RADIOLOGY REPORT*  Clinical Data:  Non-Hodgkins lymphoma.  Low grade fever.  Left upper quadrant abdominal mass.  CT CHEST, ABDOMEN AND PELVIS WITH CONTRAST  Technique:  Multidetector CT imaging of the chest, abdomen and pelvis was performed following the standard protocol during bolus administration of intravenous contrast.  Contrast: OMNIPAQUE IOHEXOL 300 MG/ML  SOLN  Comparison:  Multiple exams, including 09/05/2012  CT CHEST  Findings:  Prior prevascular mass revealed by fat planes to be to separate lymph nodes, improved in size, with the more medial lesion previously having a short axis diameter of 1.9 cm, currently 0.9 cm; more lateral noted previously having a short axis of 1.5 cm, currently 1.1 cm.  No other adenopathy in the chest.  Small hiatal hernia.  No significant vascular abnormality observed. Right Port-A-Cath tip:  Cavoatrial junction.  Ground-glass nodule, left upper lobe, 0.5 by 0.4 cm, not well shown on prior PET CT if present at all.  0.4 cm nodule in the left lower lobe, image 30 of series 4.  Mild subsegmental atelectasis in the posterior basal segments of both lower lobes.  Very faint mosaic attenuation in the upper lobes, query air trapping.  IMPRESSION:  1.  Reduced size of the two adjacent prevascular lymph nodes. 2.  Small hiatal hernia. 3.  4 mm ground-glass nodule in the left upper lobe; 4 mm potentially solid nodule in the left lower lobe. If the patient is at high risk for bronchogenic carcinoma, follow-up chest CT at 1 year is recommended.  If the patient is at low risk, no follow-up is needed.  This recommendation follows the consensus statement: Guidelines for Management of Small Pulmonary Nodules Detected on CT Scans:  A Statement from the Fleischner Society as published in Radiology 2005; 237:395-400. 4.  Faint to mosaic attenuation in the upper lobes, query air trapping.  CT ABDOMEN AND PELVIS  Findings:  The liver, spleen, pancreas, and adrenal glands appear  unremarkable.  The gallbladder and biliary system appear unremarkable.  The kidneys appear unremarkable, as do the proximal ureters.  Left upper quadrant mesenteric mass partially involves the superior mesenteric artery and wraps around the left side of the superior mesenteric vein.  Mass currently measures 5.4 x 6.1 cm (formerly 11.4 x 11.2 cm) and demonstrates reduced density.  Appendix normal. No pathologic pelvic adenopathy is identified.  Urinary bladder unremarkable.  No dilated bowel. Left periaortic node short axis diameter 0.4 cm (formerly 1.1 cm).  IMPRESSION:  1.  Considerable reduction in size of the left upper quadrant mesenteric mass and in the retroperitoneal lymph node compared to prior.   Original Report Authenticated By: Gaylyn Rong, M.D.     Medications: I have reviewed the patient's current medications.  Assessment/Plan:  1. Large abdominal mass and surrounding small lymph nodes status post CT-guided biopsy on 07/20/2012 with the pathology suggestive of non-Hodgkin's lymphoma. Status post laparoscopic biopsy of the left abdominal mass on 08/21/2011 with final pathology confirming a large B-cell lymphoma, CD20 positive, with both follicular and diffuse patterns. Negative staging bone marrow biopsy. Status post cycle 1 CHOP/Rituxan on 09/06/2012 , he  completed cycle 4 on 11/08/2012. Restaging CT scans on 11/20/2012 confirmed a decrease in the chest/abdominal lymphadenopathy and large mesenteric mass 2. Nausea/vomiting-likely secondary chemotherapy despite Aloxi/emend/Decadron, improved 3. Status post Port-A-Cath placement 08/30/2012. 4. Neutropenia following cycle 2 CHOP/Rituxan. Neulasta was added beginning with cycle 3. 5. Syncope event 11/15/2012-likely related to dehydration       6.   "Night sweats "-etiology unclear.       7.    pancytopenia secondary to chemotherapy, he received Neulasta on 11/09/2012 , the neutropenia and thrombocytopenia have resolved       8.    Fever  in the setting of neutropenia-no apparent source for infection, cultures remain negative, now off of antibiotics, no source for infection on the CT scans 11/20/2012.       9.    diarrhea-C. difficile toxin negative on 11/18/2012   He has a persistent fever. The fever was higher yesterday with associated rigors. Cultures of the blood remain negative and there is no apparent source for infection on review of his exam or the CT scans yesterday. The lymphoma appears improved and the LDH is normal. I suspect the fever is related to an infection.? Port-A-Cath infection. I will consult infectious disease service to get their opinion regarding the need for additional diagnostic evaluation, antibiotics, and removal of the Port-A-Cath. I discussed the CT scan findings with Mr. Wedin and his wife.  Recommendations:  1. infectious disease consult today      LOS: 6 days   Nakeeta Sebastiani  11/21/2012, 8:45 AM

## 2012-11-22 DIAGNOSIS — C8583 Other specified types of non-Hodgkin lymphoma, intra-abdominal lymph nodes: Secondary | ICD-10-CM

## 2012-11-22 LAB — CULTURE, BLOOD (ROUTINE X 2): Culture: NO GROWTH

## 2012-11-22 LAB — CBC WITH DIFFERENTIAL/PLATELET
HCT: 30.3 % — ABNORMAL LOW (ref 39.0–52.0)
Hemoglobin: 10 g/dL — ABNORMAL LOW (ref 13.0–17.0)
Lymphocytes Relative: 8 % — ABNORMAL LOW (ref 12–46)
Lymphs Abs: 0.7 10*3/uL (ref 0.7–4.0)
MCHC: 33 g/dL (ref 30.0–36.0)
Monocytes Absolute: 0.7 10*3/uL (ref 0.1–1.0)
Monocytes Relative: 9 % (ref 3–12)
Neutro Abs: 6.7 10*3/uL (ref 1.7–7.7)
RBC: 3.49 MIL/uL — ABNORMAL LOW (ref 4.22–5.81)
WBC: 8.1 10*3/uL (ref 4.0–10.5)

## 2012-11-22 LAB — URINE CULTURE: Culture: NO GROWTH

## 2012-11-22 LAB — BASIC METABOLIC PANEL
BUN: 12 mg/dL (ref 6–23)
CO2: 27 mEq/L (ref 19–32)
Chloride: 103 mEq/L (ref 96–112)
Creatinine, Ser: 0.84 mg/dL (ref 0.50–1.35)
Potassium: 4 mEq/L (ref 3.5–5.1)

## 2012-11-22 LAB — MRSA PCR SCREENING: MRSA by PCR: NEGATIVE

## 2012-11-22 MED ORDER — DEXTROSE 5 % IV SOLN: 1.0000 g | Freq: Three times a day (TID) | INTRAVENOUS | Status: DC

## 2012-11-22 MED ORDER — VANCOMYCIN HCL IN DEXTROSE 1-5 GM/200ML-% IV SOLN
1000.0000 mg | Freq: Two times a day (BID) | INTRAVENOUS | Status: DC
  Administered 2012-11-22 – 2012-11-24 (×4): 1000 mg via INTRAVENOUS
  Filled 2012-11-22 (×6): qty 200

## 2012-11-22 MED ORDER — IBUPROFEN 800 MG PO TABS: 400.0000 mg | ORAL_TABLET | Freq: Four times a day (QID) | ORAL | Status: DC | PRN

## 2012-11-22 MED ORDER — VANCOMYCIN HCL 10 G IV SOLR
2000.0000 mg | Freq: Once | INTRAVENOUS | Status: AC
  Administered 2012-11-22: 2000 mg via INTRAVENOUS
  Filled 2012-11-22 (×2): qty 2000

## 2012-11-22 MED ORDER — DEXTROSE 5 % IV SOLN
2.0000 g | Freq: Three times a day (TID) | INTRAVENOUS | Status: DC
  Administered 2012-11-22 – 2012-11-27 (×17): 2 g via INTRAVENOUS
  Filled 2012-11-22 (×18): qty 2

## 2012-11-22 NOTE — Progress Notes (Signed)
ANTIBIOTIC CONSULT NOTE - Initial  Pharmacy Consult for Cefepime/Vancomycin Indication: Continued fever/chills during observation off abx  No Known Allergies  Patient Measurements: Height: 5' 10.08" (178 cm) Weight: 223 lb 15.8 oz (101.6 kg) IBW/kg (Calculated) : 73.18 Adjusted Body Weight:   Vital Signs: Temp: 98.1 F (36.7 C) (04/10 0534) Temp src: Oral (04/10 0534) BP: 148/82 mmHg (04/10 0534) Pulse Rate: 63 (04/10 0534) Intake/Output from previous day:   Intake/Output from this shift:    Labs:  Recent Labs  11/20/12 0524 11/21/12 0500 11/22/12 0605  WBC 9.7 9.2 8.1  HGB 10.0* 10.2* 10.0*  PLT 141* 161 204  CREATININE  --  0.87 0.84   Estimated Creatinine Clearance: 118.9 ml/min (by C-G formula based on Cr of 0.84). No results found for this basename: VANCOTROUGH, VANCOPEAK, VANCORANDOM, GENTTROUGH, GENTPEAK, GENTRANDOM, TOBRATROUGH, TOBRAPEAK, TOBRARND, AMIKACINPEAK, AMIKACINTROU, AMIKACIN,  in the last 72 hours   Microbiology: Recent Results (from the past 720 hour(s))  CULTURE, BLOOD (ROUTINE X 2)     Status: None   Collection Time    11/15/12 10:25 PM      Result Value Range Status   Specimen Description BLOOD RIGHT HAND   Final   Special Requests BOTTLES DRAWN AEROBIC AND ANAEROBIC 3CC   Final   Culture  Setup Time 11/16/2012 01:09   Final   Culture NO GROWTH 5 DAYS   Final   Report Status 11/22/2012 FINAL   Final  CULTURE, BLOOD (ROUTINE X 2)     Status: None   Collection Time    11/15/12 10:30 PM      Result Value Range Status   Specimen Description BLOOD RIGHT ARM   Final   Special Requests BOTTLES DRAWN AEROBIC AND ANAEROBIC 5CC   Final   Culture  Setup Time 11/16/2012 02:20   Final   Culture NO GROWTH 5 DAYS   Final   Report Status 11/22/2012 FINAL   Final  CULTURE, BLOOD (ROUTINE X 2)     Status: None   Collection Time    11/16/12  6:55 PM      Result Value Range Status   Specimen Description BLOOD ARM   Final   Special Requests BOTTLES  DRAWN AEROBIC AND ANAEROBIC 10CC   Final   Culture  Setup Time 11/17/2012 01:48   Final   Culture     Final   Value:        BLOOD CULTURE RECEIVED NO GROWTH TO DATE CULTURE WILL BE HELD FOR 5 DAYS BEFORE ISSUING A FINAL NEGATIVE REPORT   Report Status PENDING   Incomplete  MRSA PCR SCREENING     Status: None   Collection Time    11/17/12  8:01 AM      Result Value Range Status   MRSA by PCR NEGATIVE  NEGATIVE Final   Comment:            The GeneXpert MRSA Assay (FDA     approved for NASAL specimens     only), is one component of a     comprehensive MRSA colonization     surveillance program. It is not     intended to diagnose MRSA     infection nor to guide or     monitor treatment for     MRSA infections.  CLOSTRIDIUM DIFFICILE BY PCR     Status: None   Collection Time    11/18/12  7:49 PM      Result Value Range Status   C  difficile by pcr NEGATIVE  NEGATIVE Final  CULTURE, BLOOD (ROUTINE X 2)     Status: None   Collection Time    11/20/12  8:20 PM      Result Value Range Status   Specimen Description BLOOD RIGHT HAND   Final   Special Requests BOTTLES DRAWN AEROBIC AND ANAEROBIC 10CC   Final   Culture  Setup Time 11/21/2012 01:53   Final   Culture     Final   Value:        BLOOD CULTURE RECEIVED NO GROWTH TO DATE CULTURE WILL BE HELD FOR 5 DAYS BEFORE ISSUING A FINAL NEGATIVE REPORT   Report Status PENDING   Incomplete  CULTURE, BLOOD (ROUTINE X 2)     Status: None   Collection Time    11/20/12  8:24 PM      Result Value Range Status   Specimen Description BLOOD PORTA CATH   Final   Special Requests BOTTLES DRAWN AEROBIC AND ANAEROBIC 10CC   Final   Culture  Setup Time 11/21/2012 01:53   Final   Culture     Final   Value:        BLOOD CULTURE RECEIVED NO GROWTH TO DATE CULTURE WILL BE HELD FOR 5 DAYS BEFORE ISSUING A FINAL NEGATIVE REPORT   Report Status PENDING   Incomplete    Anti-infectives   Start     Dose/Rate Route Frequency Ordered Stop   11/16/12 0800   vancomycin (VANCOCIN) IVPB 1000 mg/200 mL premix  Status:  Discontinued     1,000 mg 200 mL/hr over 60 Minutes Intravenous Every 8 hours 11/16/12 0143 11/18/12 1715   11/15/12 2230  vancomycin (VANCOCIN) IVPB 1000 mg/200 mL premix     1,000 mg 200 mL/hr over 60 Minutes Intravenous  Once 11/15/12 2210 11/16/12 0200   11/15/12 2230  ceFEPIme (MAXIPIME) 1 g in dextrose 5 % 50 mL IVPB  Status:  Discontinued     1 g 100 mL/hr over 30 Minutes Intravenous 3 times per day 11/15/12 2223 11/15/12 2224   11/15/12 2230  ceFEPIme (MAXIPIME) 2 g in dextrose 5 % 50 mL IVPB  Status:  Discontinued     2 g 100 mL/hr over 30 Minutes Intravenous 3 times per day 11/15/12 2224 11/19/12 1350      Assessment: 56 yom with h/o non-Hodgkin's lymphoma currently undergoing chemotherapy (last chemo 11/08/12, with Neulasta 11/09/12). Patient was being observed off antibiotics but has continued to spike fevers now with chills and nightsweats, order to resume antibiotics. ID also following. Patinet does have porta-cath but no erythema or tenderness per notes to suggest infection of port.   4/3 Vanc >> 4/6, resume 4/10 >> 4/3 >> Cefepime >> 4/7, resume 4/10 >>  Tmax: 103 WBCs: 8.1 (neutropenia resolved 4/4) Renal: Scr stable, wnl, CrCl > 100   4/3 Blood >> NGTD 4/4 Blood >> NGTD  4/5 MRSA PCR >> negative 4/6 C.diff PCR >> negative 4/8 Blood >> NGTD 4/9 Urine .Marland Kitchen Pending 4/9Blood >> pending  Plan:   Vancomycin 2gm IV x 1 then 1gm IV q12h  Will need to check Css trough level if vancomycin continues  Cefepime 2gm IV q8h  Dannielle Huh 11/22/2012,8:30 AM

## 2012-11-22 NOTE — Progress Notes (Signed)
[facility] PROGRESS NOTE  Adam Alvarado AVW:[id number] DOB: 1956/01/20 DOA: 11/15/2012 PCP: [name], MD  Assessment/Plan: #1 neutropenic fever/fever Patient initially was noted to have low-grade temperatures on admission however yesterday still spiking temperatures up to 104 last night. Urinalysis is negative. Urine cultures have been negative. Blood cultures with no growth to date. CT of the abdomen and pelvis negative for any source of infection. Patient was on IV vancomycin and cefepime for approximately 3 days which was subsequently discontinued. Patient with ongoing fevers. Repeat blood cultures ordered. IV vancomycin and cefepime resumed today per ID recommendations. Continue antipyretics. ID following and appreciate their recommendations.   #2 syncope Likely vasovagal in the setting of dehydration/orthostasis. Patient was noted to be orthostatic on 11/16/2012 and hydrate with IV fluids. Cardiac enzymes have been negative. EKG is reassuring. 2-D echo in the EF of 60-65% with no wall motion abnormalities from 08/31/2012. CT of the head was negative. No further episodes of syncope. Follow.  #3 diarrhea Improved. Patient states had a solid bowel movement. C. difficile PCR has been negative. Imodium as needed.  #4 pancytopenia secondary to chemotherapy Patient received the last on 11/09/2012. Neutropenia thrombocytopenia have improved. Follow.  #5 night sweats  Questionable etiology. Follow. May be secondary to be symptoms versus occult infection. Follow.  #6 B-cell lymphoma large abdominal mass and surrounding small lymph nodes status post CT-guided biopsy on 07/20/2012 suggestive of non-Hodgkin's lymphoma. Status post laparoscopic biopsy of left abdominal mass 08/21/2011 with final pathology confirming a large B-cell lymphoma, CD20 positive with both follicular and diffuse patterns. Bone marrow biopsy was negative staging. Patient is status post cycle 1 CHOP/Rituxan on 09/06/2012,  he completed cycle 4 11/09/2002 2. Restaging CT scans on 11/20/2012 confirming decreasing chest/abdominal lymphadenopathy and large mesenteric mass. Per oncology.  #7 nausea and vomiting Secondary to chemotherapy. Improved. Continue supportive care.  #8 status post Port-A-Cath placement 08/30/2012  Code Status: Stable. Family Communication: Updated patient and wife at bedside Disposition Plan: Home when medically stable   Consultants:  Hematology oncology: Dr. Gaylyn Rong 11/17/12  Infectious diseases: Dr. Orvan Falconer 11/21/2012  Procedures:  CT chest and abdomen 11/20/2012  CT of the head 11/15/2012  Chest x-ray of 11/15/2012  Lower extremity Dopplers 11/19/2012  Antibiotics:  IV vancomycin for 4/4/ 2004 to 11/18/2012  IV cefepime 11/15/2012 to  11/19/12  IV vancomycin 4/10/ 2014  IV cefepime 11/22/2012  HPI/Subjective: Patient states early this morning had fever with temperatures as high as 104 and rigors and chills as well as significant diaphoresis such that he was drenched.  Objective: Filed Vitals:   11/21/12 2141 11/21/12 2208 11/22/12 0252 11/22/12 0534  BP:  130/69  148/82  Pulse:  87  63  Temp: 100.1 F (37.8 C) 99.3 F (37.4 C) 97.9 F (36.6 C) 98.1 F (36.7 C)  TempSrc: Oral Oral Oral Oral  Resp:  18  18  Height:      Weight:      SpO2:  91%  94%    Intake/Output Summary (Last 24 hours) at 11/22/12 1021 Last data filed at 11/22/12 1000  Gross per 24 hour  Intake   1960 ml  Output      0 ml  Net   1960 ml   Filed Weights   11/15/12 2113  Weight: 101.6 kg (223 lb 15.8 oz)    Exam:   General:  NAD  Cardiovascular: RRR  Respiratory: CTAB  Abdomen: Soft/NT/ND/+BS  Musculoskeletal: 5/5 bue STRENGTH, 5/5 ble STRENGTH  Extremities: No clubbing cyanosis  or edema   Data Reviewed: Basic Metabolic Panel:  Recent Labs Lab 11/15/12 2130 11/16/12 0450 11/17/12 0345 11/21/12 0500 11/22/12 0605  NA 137 132* 135 137 139  K 4.3 4.1 3.9 4.1 4.0   CL 100 99 100 101 103  CO2 30 26 28 29 27   GLUCOSE 125* 114* 106* 101* 99  BUN 17 14 12 9 12   CREATININE 1.02 0.84 0.91 0.87 0.84  CALCIUM 8.5 8.1* 8.3* 8.7 8.4   Liver Function Tests:  Recent Labs Lab 11/15/12 2130 11/21/12 0500  AST 20 23  ALT 42 35  ALKPHOS 75 77  BILITOT 0.3 0.2*  PROT 6.0 5.6*  ALBUMIN 3.1* 2.8*   No results found for this basename: LIPASE, AMYLASE,  in the last 168 hours No results found for this basename: AMMONIA,  in the last 168 hours CBC:  Recent Labs Lab 11/18/12 0530 11/19/12 0625 11/20/12 0524 11/21/12 0500 11/22/12 0605  WBC 5.5 9.4 9.7 9.2 8.1  NEUTROABS 4.0 7.8* 8.1* 7.5 6.7  HGB 9.9* 10.0* 10.0* 10.2* 10.0*  HCT 29.2* 28.9* 29.1* 30.3* 30.3*  MCV 86.6 86.3 86.1 86.3 86.8  PLT 126* 128* 141* 161 204   Cardiac Enzymes:  Recent Labs Lab 11/16/12 1105 11/16/12 1553 11/16/12 2206  TROPONINI <0.30 <0.30 <0.30   BNP (last 3 results) No results found for this basename: PROBNP,  in the last 8760 hours CBG: No results found for this basename: GLUCAP,  in the last 168 hours  Recent Results (from the past 240 hour(s))  CULTURE, BLOOD (ROUTINE X 2)     Status: None   Collection Time    11/15/12 10:25 PM      Result Value Range Status   Specimen Description BLOOD RIGHT HAND   Final   Special Requests BOTTLES DRAWN AEROBIC AND ANAEROBIC 3CC   Final   Culture  Setup Time 11/16/2012 01:09   Final   Culture NO GROWTH 5 DAYS   Final   Report Status 11/22/2012 FINAL   Final  CULTURE, BLOOD (ROUTINE X 2)     Status: None   Collection Time    11/15/12 10:30 PM      Result Value Range Status   Specimen Description BLOOD RIGHT ARM   Final   Special Requests BOTTLES DRAWN AEROBIC AND ANAEROBIC 5CC   Final   Culture  Setup Time 11/16/2012 02:20   Final   Culture NO GROWTH 5 DAYS   Final   Report Status 11/22/2012 FINAL   Final  CULTURE, BLOOD (ROUTINE X 2)     Status: None   Collection Time    11/16/12  6:55 PM      Result Value  Range Status   Specimen Description BLOOD ARM   Final   Special Requests BOTTLES DRAWN AEROBIC AND ANAEROBIC 10CC   Final   Culture  Setup Time 11/17/2012 01:48   Final   Culture     Final   Value:        BLOOD CULTURE RECEIVED NO GROWTH TO DATE CULTURE WILL BE HELD FOR 5 DAYS BEFORE ISSUING A FINAL NEGATIVE REPORT   Report Status PENDING   Incomplete  MRSA PCR SCREENING     Status: None   Collection Time    11/17/12  8:01 AM      Result Value Range Status   MRSA by PCR NEGATIVE  NEGATIVE Final   Comment:            The GeneXpert MRSA Assay (  FDA     approved for NASAL specimens     only), is one component of a     comprehensive MRSA colonization     surveillance program. It is not     intended to diagnose MRSA     infection nor to guide or     monitor treatment for     MRSA infections.  CLOSTRIDIUM DIFFICILE BY PCR     Status: None   Collection Time    11/18/12  7:49 PM      Result Value Range Status   C difficile by pcr NEGATIVE  NEGATIVE Final  CULTURE, BLOOD (ROUTINE X 2)     Status: None   Collection Time    11/20/12  8:20 PM      Result Value Range Status   Specimen Description BLOOD RIGHT HAND   Final   Special Requests BOTTLES DRAWN AEROBIC AND ANAEROBIC 10CC   Final   Culture  Setup Time 11/21/2012 01:53   Final   Culture     Final   Value:        BLOOD CULTURE RECEIVED NO GROWTH TO DATE CULTURE WILL BE HELD FOR 5 DAYS BEFORE ISSUING A FINAL NEGATIVE REPORT   Report Status PENDING   Incomplete  CULTURE, BLOOD (ROUTINE X 2)     Status: None   Collection Time    11/20/12  8:24 PM      Result Value Range Status   Specimen Description BLOOD PORTA CATH   Final   Special Requests BOTTLES DRAWN AEROBIC AND ANAEROBIC 10CC   Final   Culture  Setup Time 11/21/2012 01:53   Final   Culture     Final   Value:        BLOOD CULTURE RECEIVED NO GROWTH TO DATE CULTURE WILL BE HELD FOR 5 DAYS BEFORE ISSUING A FINAL NEGATIVE REPORT   Report Status PENDING   Incomplete      Studies: Ct Chest W Contrast  11/20/2012  *RADIOLOGY REPORT*  Clinical Data:  Non-Hodgkins lymphoma.  Low grade fever.  Left upper quadrant abdominal mass.  CT CHEST, ABDOMEN AND PELVIS WITH CONTRAST  Technique:  Multidetector CT imaging of the chest, abdomen and pelvis was performed following the standard protocol during bolus administration of intravenous contrast.  Contrast: OMNIPAQUE IOHEXOL 300 MG/ML  SOLN  Comparison:  Multiple exams, including 09/05/2012  CT CHEST  Findings:  Prior prevascular mass revealed by fat planes to be to separate lymph nodes, improved in size, with the more medial lesion previously having a short axis diameter of 1.9 cm, currently 0.9 cm; more lateral noted previously having a short axis of 1.5 cm, currently 1.1 cm.  No other adenopathy in the chest.  Small hiatal hernia.  No significant vascular abnormality observed. Right Port-A-Cath tip:  Cavoatrial junction.  Ground-glass nodule, left upper lobe, 0.5 by 0.4 cm, not well shown on prior PET CT if present at all.  0.4 cm nodule in the left lower lobe, image 30 of series 4.  Mild subsegmental atelectasis in the posterior basal segments of both lower lobes.  Very faint mosaic attenuation in the upper lobes, query air trapping.  IMPRESSION:  1.  Reduced size of the two adjacent prevascular lymph nodes. 2.  Small hiatal hernia. 3.  4 mm ground-glass nodule in the left upper lobe; 4 mm potentially solid nodule in the left lower lobe. If the patient is at high risk for bronchogenic carcinoma, follow-up chest CT at 1  year is recommended.  If the patient is at low risk, no follow-up is needed.  This recommendation follows the consensus statement: Guidelines for Management of Small Pulmonary Nodules Detected on CT Scans:  A Statement from the Fleischner Society as published in Radiology 2005; 237:395-400. 4.  Faint to mosaic attenuation in the upper lobes, query air trapping.  CT ABDOMEN AND PELVIS  Findings:  The liver, spleen,  pancreas, and adrenal glands appear unremarkable.  The gallbladder and biliary system appear unremarkable.  The kidneys appear unremarkable, as do the proximal ureters.  Left upper quadrant mesenteric mass partially involves the superior mesenteric artery and wraps around the left side of the superior mesenteric vein.  Mass currently measures 5.4 x 6.1 cm (formerly 11.4 x 11.2 cm) and demonstrates reduced density.  Appendix normal. No pathologic pelvic adenopathy is identified.  Urinary bladder unremarkable.  No dilated bowel. Left periaortic node short axis diameter 0.4 cm (formerly 1.1 cm).  IMPRESSION:  1.  Considerable reduction in size of the left upper quadrant mesenteric mass and in the retroperitoneal lymph node compared to prior.   Original Report Authenticated By: Gaylyn Rong, M.D.    Ct Abdomen Pelvis W Contrast  11/20/2012  *RADIOLOGY REPORT*  Clinical Data:  Non-Hodgkins lymphoma.  Low grade fever.  Left upper quadrant abdominal mass.  CT CHEST, ABDOMEN AND PELVIS WITH CONTRAST  Technique:  Multidetector CT imaging of the chest, abdomen and pelvis was performed following the standard protocol during bolus administration of intravenous contrast.  Contrast: OMNIPAQUE IOHEXOL 300 MG/ML  SOLN  Comparison:  Multiple exams, including 09/05/2012  CT CHEST  Findings:  Prior prevascular mass revealed by fat planes to be to separate lymph nodes, improved in size, with the more medial lesion previously having a short axis diameter of 1.9 cm, currently 0.9 cm; more lateral noted previously having a short axis of 1.5 cm, currently 1.1 cm.  No other adenopathy in the chest.  Small hiatal hernia.  No significant vascular abnormality observed. Right Port-A-Cath tip:  Cavoatrial junction.  Ground-glass nodule, left upper lobe, 0.5 by 0.4 cm, not well shown on prior PET CT if present at all.  0.4 cm nodule in the left lower lobe, image 30 of series 4.  Mild subsegmental atelectasis in the posterior basal  segments of both lower lobes.  Very faint mosaic attenuation in the upper lobes, query air trapping.  IMPRESSION:  1.  Reduced size of the two adjacent prevascular lymph nodes. 2.  Small hiatal hernia. 3.  4 mm ground-glass nodule in the left upper lobe; 4 mm potentially solid nodule in the left lower lobe. If the patient is at high risk for bronchogenic carcinoma, follow-up chest CT at 1 year is recommended.  If the patient is at low risk, no follow-up is needed.  This recommendation follows the consensus statement: Guidelines for Management of Small Pulmonary Nodules Detected on CT Scans:  A Statement from the Fleischner Society as published in Radiology 2005; 237:395-400. 4.  Faint to mosaic attenuation in the upper lobes, query air trapping.  CT ABDOMEN AND PELVIS  Findings:  The liver, spleen, pancreas, and adrenal glands appear unremarkable.  The gallbladder and biliary system appear unremarkable.  The kidneys appear unremarkable, as do the proximal ureters.  Left upper quadrant mesenteric mass partially involves the superior mesenteric artery and wraps around the left side of the superior mesenteric vein.  Mass currently measures 5.4 x 6.1 cm (formerly 11.4 x 11.2 cm) and demonstrates reduced density.  Appendix normal. No pathologic pelvic adenopathy is identified.  Urinary bladder unremarkable.  No dilated bowel. Left periaortic node short axis diameter 0.4 cm (formerly 1.1 cm).  IMPRESSION:  1.  Considerable reduction in size of the left upper quadrant mesenteric mass and in the retroperitoneal lymph node compared to prior.   Original Report Authenticated By: Gaylyn Rong, M.D.     Scheduled Meds: . aspirin  650 mg Oral Daily  . ceFEPime (MAXIPIME) IV  2 g Intravenous Q8H  . enoxaparin (LOVENOX) injection  50 mg Subcutaneous Q24H  . sodium chloride  3 mL Intravenous Q12H  . sodium chloride  3 mL Intravenous Q12H  . vancomycin  2,000 mg Intravenous Once  . vancomycin  1,000 mg Intravenous Q12H    Continuous Infusions: . sodium chloride 75 mL/hr at 11/21/12 1128    Principal Problem:   Fever Active Problems:   Non-Hodgkin lymphoma of intra-abdominal lymph nodes   Syncope   Hyponatremia   Anemia   Nausea with vomiting   Diarrhea   Antineoplastic chemotherapy induced pancytopenia    Time spent: > 35 mins    [facility]  [facility] Pager [phone]. If 7PM-7AM, please contact night-coverage at [email or web address], password [facility] 11/22/2012, 10:21 AM  LOS: 7 days

## 2012-11-22 NOTE — Progress Notes (Signed)
Patient ID: Adam Alvarado, male   DOB: 15-May-1956, 57 y.o.   MRN: [id number]         [facility]    Date of Admission:  11/15/2012     Principal Problem:   Fever Active Problems:   Syncope   Hyponatremia   Nausea with vomiting   Diarrhea   Non-Hodgkin lymphoma of intra-abdominal lymph nodes   Anemia   Antineoplastic chemotherapy induced pancytopenia   . aspirin  650 mg Oral Daily  . ceFEPime (MAXIPIME) IV  2 g Intravenous Q8H  . enoxaparin (LOVENOX) injection  50 mg Subcutaneous Q24H  . sodium chloride  3 mL Intravenous Q12H  . sodium chloride  3 mL Intravenous Q12H  . vancomycin  1,000 mg Intravenous Q12H    Subjective: Mr. Behrend had another episode of severe fever to 104 last night associated with severe rigors followed by drenching sweats. He is felt much better today with his fever back to normal. He has not had any further diarrhea. He denies any problems with his Port-A-Cath, headache, cough, shortness of breath, pain, nausea, vomiting or other new symptoms.  Objective: Temp:  [97.9 F (36.6 C)-103 F (39.4 C)] 98.1 F (36.7 C) (04/10 1400) Pulse Rate:  [61-87] 61 (04/10 1400) Resp:  [18] 18 (04/10 1400) BP: (130-148)/(68-82) 133/68 mmHg (04/10 1400) SpO2:  [91 %-97 %] 97 % (04/10 1400)  General: He looks perfectly normal and in good spirits now Skin: No rash. His right upper chest Port-A-Cath site appears normal Lungs: Clear Cor: Regular S1 and S2 no murmurs Abdomen: Soft and nontender Joints and extremities: Normal  Lab Results Lab Results  Component Value Date   WBC 8.1 11/22/2012   HGB 10.0* 11/22/2012   HCT 30.3* 11/22/2012   MCV 86.8 11/22/2012   PLT 204 11/22/2012    Lab Results  Component Value Date   CREATININE 0.84 11/22/2012   BUN 12 11/22/2012   NA 139 11/22/2012   K 4.0 11/22/2012   CL 103 11/22/2012   CO2 27 11/22/2012    Lab Results  Component Value Date   ALT 35 11/21/2012   AST 23 11/21/2012   ALKPHOS 77  11/21/2012   BILITOT 0.2* 11/21/2012      Microbiology: Recent Results (from the past 240 hour(s))  CULTURE, BLOOD (ROUTINE X 2)     Status: None   Collection Time    11/15/12 10:25 PM      Result Value Range Status   Specimen Description BLOOD RIGHT HAND   Final   Special Requests BOTTLES DRAWN AEROBIC AND ANAEROBIC 3CC   Final   Culture  Setup Time 11/16/2012 01:09   Final   Culture NO GROWTH 5 DAYS   Final   Report Status 11/22/2012 FINAL   Final  CULTURE, BLOOD (ROUTINE X 2)     Status: None   Collection Time    11/15/12 10:30 PM      Result Value Range Status   Specimen Description BLOOD RIGHT ARM   Final   Special Requests BOTTLES DRAWN AEROBIC AND ANAEROBIC 5CC   Final   Culture  Setup Time 11/16/2012 02:20   Final   Culture NO GROWTH 5 DAYS   Final   Report Status 11/22/2012 FINAL   Final  CULTURE, BLOOD (ROUTINE X 2)     Status: None   Collection Time    11/16/12  6:55 PM      Result Value Range Status   Specimen Description  BLOOD ARM   Final   Special Requests BOTTLES DRAWN AEROBIC AND ANAEROBIC 10CC   Final   Culture  Setup Time 11/17/2012 01:48   Final   Culture     Final   Value:        BLOOD CULTURE RECEIVED NO GROWTH TO DATE CULTURE WILL BE HELD FOR 5 DAYS BEFORE ISSUING A FINAL NEGATIVE REPORT   Report Status PENDING   Incomplete  MRSA PCR SCREENING     Status: None   Collection Time    11/17/12  8:01 AM      Result Value Range Status   MRSA by PCR NEGATIVE  NEGATIVE Final   Comment:            The GeneXpert MRSA Assay (FDA     approved for NASAL specimens     only), is one component of a     comprehensive MRSA colonization     surveillance program. It is not     intended to diagnose MRSA     infection nor to guide or     monitor treatment for     MRSA infections.  CLOSTRIDIUM DIFFICILE BY PCR     Status: None   Collection Time    11/18/12  7:49 PM      Result Value Range Status   C difficile by pcr NEGATIVE  NEGATIVE Final  CULTURE, BLOOD (ROUTINE X  2)     Status: None   Collection Time    11/20/12  8:20 PM      Result Value Range Status   Specimen Description BLOOD RIGHT HAND   Final   Special Requests BOTTLES DRAWN AEROBIC AND ANAEROBIC 10CC   Final   Culture  Setup Time 11/21/2012 01:53   Final   Culture     Final   Value:        BLOOD CULTURE RECEIVED NO GROWTH TO DATE CULTURE WILL BE HELD FOR 5 DAYS BEFORE ISSUING A FINAL NEGATIVE REPORT   Report Status PENDING   Incomplete  CULTURE, BLOOD (ROUTINE X 2)     Status: None   Collection Time    11/20/12  8:24 PM      Result Value Range Status   Specimen Description BLOOD PORTA CATH   Final   Special Requests BOTTLES DRAWN AEROBIC AND ANAEROBIC 10CC   Final   Culture  Setup Time 11/21/2012 01:53   Final   Culture     Final   Value:        BLOOD CULTURE RECEIVED NO GROWTH TO DATE CULTURE WILL BE HELD FOR 5 DAYS BEFORE ISSUING A FINAL NEGATIVE REPORT   Report Status PENDING   Incomplete  URINE CULTURE     Status: None   Collection Time    11/21/12 10:55 AM      Result Value Range Status   Specimen Description URINE, RANDOM   Final   Special Requests NONE   Final   Culture  Setup Time 11/21/2012 14:09   Final   Colony Count NO GROWTH   Final   Culture NO GROWTH   Final   Report Status 11/22/2012 FINAL   Final   Assessment: They're still no obvious source for his worsening fevers. However there is a temporal relationship between stopping recent empiric antibiotics and the fevers worsening. I am still concerned about the possibility of occult a Port-A-Cath infection. I suggested repeat blood cultures and reinstitution of vancomycin and cefepime earlier today. I discussed my  recommendations with Mr. Councilman, his wife and his mother. I will followup tomorrow morning.  Plan: 1. Await results of repeat blood cultures 2. Continue vancomycin and cefepime  [name], MD [facility] [place] [facility] [phone] pager   [phone] cell 11/22/2012, 4:10  PM

## 2012-11-22 NOTE — Progress Notes (Signed)
IP PROGRESS NOTE  Subjective:   He had a high fever last night with rigors and sweats. Intermittent nausea. No diarrhea. No pain.   Objective: Vital signs in last 24 hours: Blood pressure 148/82, pulse 63, temperature 98.1 F (36.7 C), temperature source Oral, resp. rate 18, height 5' 10.08" (1.78 m), weight 223 lb 15.8 oz (101.6 kg), SpO2 94.00%.  Intake/Output from previous day:    Physical Exam:  HEENT: No thrush or ulcers Lungs: Clear bilaterally Cardiac: Regular rate and rhythm Abdomen: Nontender, no hepatosplenomegaly, no mass Extremities: No leg edema Skin: No rash   Portacath/PICC-without erythema or tenderness   Lab Results:  Recent Labs  11/21/12 0500 11/22/12 0605  WBC 9.2 8.1  HGB 10.2* 10.0*  HCT 30.3* 30.3*  PLT 161 204   ANC 6.7  BMET  Recent Labs  11/21/12 0500 11/22/12 0605  NA 137 139  K 4.1 4.0  CL 101 103  CO2 29 27  GLUCOSE 101* 99  BUN 9 12  CREATININE 0.87 0.84  CALCIUM 8.7 8.4   LDH 217 Studies/Results: Ct Chest W Contrast  11/20/2012  *RADIOLOGY REPORT*  Clinical Data:  Non-Hodgkins lymphoma.  Low grade fever.  Left upper quadrant abdominal mass.  CT CHEST, ABDOMEN AND PELVIS WITH CONTRAST  Technique:  Multidetector CT imaging of the chest, abdomen and pelvis was performed following the standard protocol during bolus administration of intravenous contrast.  Contrast: OMNIPAQUE IOHEXOL 300 MG/ML  SOLN  Comparison:  Multiple exams, including 09/05/2012  CT CHEST  Findings:  Prior prevascular mass revealed by fat planes to be to separate lymph nodes, improved in size, with the more medial lesion previously having a short axis diameter of 1.9 cm, currently 0.9 cm; more lateral noted previously having a short axis of 1.5 cm, currently 1.1 cm.  No other adenopathy in the chest.  Small hiatal hernia.  No significant vascular abnormality observed. Right Port-A-Cath tip:  Cavoatrial junction.  Ground-glass nodule, left upper lobe, 0.5 by  0.4 cm, not well shown on prior PET CT if present at all.  0.4 cm nodule in the left lower lobe, image 30 of series 4.  Mild subsegmental atelectasis in the posterior basal segments of both lower lobes.  Very faint mosaic attenuation in the upper lobes, query air trapping.  IMPRESSION:  1.  Reduced size of the two adjacent prevascular lymph nodes. 2.  Small hiatal hernia. 3.  4 mm ground-glass nodule in the left upper lobe; 4 mm potentially solid nodule in the left lower lobe. If the patient is at high risk for bronchogenic carcinoma, follow-up chest CT at 1 year is recommended.  If the patient is at low risk, no follow-up is needed.  This recommendation follows the consensus statement: Guidelines for Management of Small Pulmonary Nodules Detected on CT Scans:  A Statement from the Fleischner Society as published in Radiology 2005; 237:395-400. 4.  Faint to mosaic attenuation in the upper lobes, query air trapping.  CT ABDOMEN AND PELVIS  Findings:  The liver, spleen, pancreas, and adrenal glands appear unremarkable.  The gallbladder and biliary system appear unremarkable.  The kidneys appear unremarkable, as do the proximal ureters.  Left upper quadrant mesenteric mass partially involves the superior mesenteric artery and wraps around the left side of the superior mesenteric vein.  Mass currently measures 5.4 x 6.1 cm (formerly 11.4 x 11.2 cm) and demonstrates reduced density.  Appendix normal. No pathologic pelvic adenopathy is identified.  Urinary bladder unremarkable.  No dilated  bowel. Left periaortic node short axis diameter 0.4 cm (formerly 1.1 cm).  IMPRESSION:  1.  Considerable reduction in size of the left upper quadrant mesenteric mass and in the retroperitoneal lymph node compared to prior.   Original Report Authenticated By: Gaylyn Rong, M.D.    Ct Abdomen Pelvis W Contrast  11/20/2012  *RADIOLOGY REPORT*  Clinical Data:  Non-Hodgkins lymphoma.  Low grade fever.  Left upper quadrant abdominal  mass.  CT CHEST, ABDOMEN AND PELVIS WITH CONTRAST  Technique:  Multidetector CT imaging of the chest, abdomen and pelvis was performed following the standard protocol during bolus administration of intravenous contrast.  Contrast: OMNIPAQUE IOHEXOL 300 MG/ML  SOLN  Comparison:  Multiple exams, including 09/05/2012  CT CHEST  Findings:  Prior prevascular mass revealed by fat planes to be to separate lymph nodes, improved in size, with the more medial lesion previously having a short axis diameter of 1.9 cm, currently 0.9 cm; more lateral noted previously having a short axis of 1.5 cm, currently 1.1 cm.  No other adenopathy in the chest.  Small hiatal hernia.  No significant vascular abnormality observed. Right Port-A-Cath tip:  Cavoatrial junction.  Ground-glass nodule, left upper lobe, 0.5 by 0.4 cm, not well shown on prior PET CT if present at all.  0.4 cm nodule in the left lower lobe, image 30 of series 4.  Mild subsegmental atelectasis in the posterior basal segments of both lower lobes.  Very faint mosaic attenuation in the upper lobes, query air trapping.  IMPRESSION:  1.  Reduced size of the two adjacent prevascular lymph nodes. 2.  Small hiatal hernia. 3.  4 mm ground-glass nodule in the left upper lobe; 4 mm potentially solid nodule in the left lower lobe. If the patient is at high risk for bronchogenic carcinoma, follow-up chest CT at 1 year is recommended.  If the patient is at low risk, no follow-up is needed.  This recommendation follows the consensus statement: Guidelines for Management of Small Pulmonary Nodules Detected on CT Scans:  A Statement from the Fleischner Society as published in Radiology 2005; 237:395-400. 4.  Faint to mosaic attenuation in the upper lobes, query air trapping.  CT ABDOMEN AND PELVIS  Findings:  The liver, spleen, pancreas, and adrenal glands appear unremarkable.  The gallbladder and biliary system appear unremarkable.  The kidneys appear unremarkable, as do the  proximal ureters.  Left upper quadrant mesenteric mass partially involves the superior mesenteric artery and wraps around the left side of the superior mesenteric vein.  Mass currently measures 5.4 x 6.1 cm (formerly 11.4 x 11.2 cm) and demonstrates reduced density.  Appendix normal. No pathologic pelvic adenopathy is identified.  Urinary bladder unremarkable.  No dilated bowel. Left periaortic node short axis diameter 0.4 cm (formerly 1.1 cm).  IMPRESSION:  1.  Considerable reduction in size of the left upper quadrant mesenteric mass and in the retroperitoneal lymph node compared to prior.   Original Report Authenticated By: Gaylyn Rong, M.D.     Medications: I have reviewed the patient's current medications.  Assessment/Plan:  1. Large abdominal mass and surrounding small lymph nodes status post CT-guided biopsy on 07/20/2012 with the pathology suggestive of non-Hodgkin's lymphoma. Status post laparoscopic biopsy of the left abdominal mass on 08/21/2011 with final pathology confirming a large B-cell lymphoma, CD20 positive, with both follicular and diffuse patterns. Negative staging bone marrow biopsy. Status post cycle 1 CHOP/Rituxan on 09/06/2012 , he completed cycle 4 on 11/08/2012. Restaging CT scans on 11/20/2012  confirmed a decrease in the chest/abdominal lymphadenopathy and large mesenteric mass 2. Nausea/vomiting-likely secondary chemotherapy despite Aloxi/emend/Decadron, improved 3. Status post Port-A-Cath placement 08/30/2012. 4. Neutropenia following cycle 2 CHOP/Rituxan. Neulasta was added beginning with cycle 3. 5. Syncope event 11/15/2012-likely related to dehydration       6.   "Night sweats "-persistent.       7.    pancytopenia secondary to chemotherapy, he received Neulasta on 11/09/2012 , the neutropenia and thrombocytopenia have resolved       8.    Fever in the setting of neutropenia-no apparent source for infection, cultures remain negative, now off of antibiotics, no  source for infection on the CT scans 11/20/2012.       9.    diarrhea-C. difficile toxin negative on 11/18/2012  He continues to have intermittent high fever and shaking chills. No localizing symptoms to suggest a source of infection. Cultures remain negative. I discussed the case with Dr. Orvan Falconer. He has a low clinical suspicion for a Port-A-Cath infection. We decided to obtain repeat blood cultures and resume emperic antibiotics.  Recommendations:  1. blood cultures this a.m. 2. Vancomycin/cefepime      LOS: 7 days   Nathania Waldman  11/22/2012, 8:17 AM

## 2012-11-23 LAB — CBC WITH DIFFERENTIAL/PLATELET
Basophils Absolute: 0 10*3/uL (ref 0.0–0.1)
Eosinophils Relative: 0 % (ref 0–5)
HCT: 29.1 % — ABNORMAL LOW (ref 39.0–52.0)
Lymphocytes Relative: 8 % — ABNORMAL LOW (ref 12–46)
Lymphs Abs: 0.6 10*3/uL — ABNORMAL LOW (ref 0.7–4.0)
MCV: 86.6 fL (ref 78.0–100.0)
Monocytes Relative: 8 % (ref 3–12)
Neutro Abs: 6.7 10*3/uL (ref 1.7–7.7)
RBC: 3.36 MIL/uL — ABNORMAL LOW (ref 4.22–5.81)
WBC: 7.9 10*3/uL (ref 4.0–10.5)

## 2012-11-23 LAB — BASIC METABOLIC PANEL
CO2: 27 mEq/L (ref 19–32)
Chloride: 101 mEq/L (ref 96–112)
GFR calc Af Amer: >90 mL/min (ref >90)
Potassium: 3.9 mEq/L (ref 3.5–5.1)
Sodium: 136 mEq/L (ref 135–145)

## 2012-11-23 LAB — CULTURE, BLOOD (ROUTINE X 2)

## 2012-11-23 NOTE — Progress Notes (Signed)
IP PROGRESS NOTE  Subjective:   He had a fever and chills again in last night. He also vomited. Most of the episodes of emesis have been associated with fever. No abdominal pain, headache, photophobia, or diarrhea. No nausea this morning.   Objective: Vital signs in last 24 hours: Blood pressure 128/62, pulse 89, temperature 99.7 F (37.6 C), temperature source Oral, resp. rate 18, height 5' 10.08" (1.78 m), weight 223 lb 15.8 oz (101.6 kg), SpO2 93.00%.  Intake/Output from previous day: 04/10 0701 - 04/11 0700 In: 3110 [I.V.:2510; IV Piggyback:600] Out: -   Physical Exam:  HEENT: No thrush or ulcers, no sinus tenderness Lungs: Clear bilaterally Cardiac: Regular rate and rhythm Abdomen: Nontender, no hepatosplenomegaly, no mass Extremities: No leg edema Skin: No rash   Portacath/PICC-without erythema or tenderness   Lab Results:  Recent Labs  11/22/12 0605 11/23/12 0533  WBC 8.1 7.9  HGB 10.0* 9.8*  HCT 30.3* 29.1*  PLT 204 235   ANC 6.7  BMET  Recent Labs  11/22/12 0605 11/23/12 0533  NA 139 136  K 4.0 3.9  CL 103 101  CO2 27 27  GLUCOSE 99 98  BUN 12 11  CREATININE 0.84 0.88  CALCIUM 8.4 8.4   LDH 217 Studies/Results: No results found.  Medications: I have reviewed the patient's current medications.  Assessment/Plan:  1. Large abdominal mass and surrounding small lymph nodes status post CT-guided biopsy on 07/20/2012 with the pathology suggestive of non-Hodgkin's lymphoma. Status post laparoscopic biopsy of the left abdominal mass on 08/21/2011 with final pathology confirming a large B-cell lymphoma, CD20 positive, with both follicular and diffuse patterns. Negative staging bone marrow biopsy. Status post cycle 1 CHOP/Rituxan on 09/06/2012 , he completed cycle 4 on 11/08/2012. Restaging CT scans on 11/20/2012 confirmed a decrease in the chest/abdominal lymphadenopathy and large mesenteric mass 2. Nausea/vomiting on admission-likely secondary  chemotherapy despite Aloxi/emend/Decadron, he continues to have episodes of nausea and vomiting when he has a fever. 3. Status post Port-A-Cath placement 08/30/2012. 4. Neutropenia following cycle 2 CHOP/Rituxan. Neulasta was added beginning with cycle 3. 5. Syncope event 11/15/2012-likely related to dehydration       6.   "Night sweats "-persistent.       7.    pancytopenia secondary to chemotherapy, he received Neulasta on 11/09/2012 , the neutropenia and thrombocytopenia have resolved, persistent anemia secondary to chemotherapy and probably phlebotomy       8.    Fever in the setting of neutropenia-no apparent source for infection, cultures remain negative, vancomycin/cefepime resumed on 11/22/2012. no source for infection on the CT scans 11/20/2012.       9.    diarrhea-C. difficile toxin negative on 11/18/2012  He continues to have intermittent fever/chills. He has also developed nausea/vomiting on several occasions over the past few days, mostly associated with fever. Cultures remain negative. No apparent source for infection. No symptoms to suggest a CNS source. I reviewed the CT scans in radiology and there is no evidence of an abdominal abscess. I have a low clinical suspicion for progression of the lymphoma given the CT findings. Recommendations:  1. continue antibiotics as recommended by Dr. Orvan Falconer 2. Consider adding antifungal coverage 3. Consider Port-A-Cath removal if the fever persists while on antibiotics  Oncology will continue following him over the weekend.       LOS: 8 days   Adam Alvarado, Adam Alvarado  11/23/2012, 1:52 PM

## 2012-11-23 NOTE — Progress Notes (Signed)
[facility] PROGRESS NOTE  Adam Alvarado ZOX:[id number] DOB: 15-May-1956 DOA: 11/15/2012 PCP: [name], MD  Assessment/Plan: #1 neutropenic fever/fever Patient initially was noted to have low-grade temperatures on admission however yesterday still spiking temperatures up to 102.5 last night. Urinalysis is negative. Urine cultures have been negative. Blood cultures with no growth to date. CT of the abdomen and pelvis negative for any source of infection. Patient was on IV vancomycin and cefepime for approximately 3 days which was subsequently discontinued. Patient with ongoing fevers. Repeat blood cultures pending. IV vancomycin and cefepime resumed yesterday per ID recommendations. Continue antipyretics. ID following and appreciate their recommendations.   #2 syncope Likely vasovagal in the setting of dehydration/orthostasis. Patient was noted to be orthostatic on 11/16/2012 and hydrate with IV fluids. Cardiac enzymes have been negative. EKG is reassuring. 2-D echo in the EF of 60-65% with no wall motion abnormalities from 08/31/2012. CT of the head was negative. No further episodes of syncope. NSL IVF. Follow.  #3 diarrhea Improved. Patient states had a solid bowel movement. C. difficile PCR has been negative. Imodium as needed.  #4 pancytopenia secondary to chemotherapy Patient received the last on 11/09/2012. Neutropenia thrombocytopenia have improved. Anemia stable. Follow.  #5 night sweats  Questionable etiology. Follow. May be secondary to be symptoms versus occult infection. Cultures pending. Follow.  #6 B-cell lymphoma large abdominal mass and surrounding small lymph nodes status post CT-guided biopsy on 07/20/2012 suggestive of non-Hodgkin's lymphoma. Status post laparoscopic biopsy of left abdominal mass 08/21/2011 with final pathology confirming a large B-cell lymphoma, CD20 positive with both follicular and diffuse patterns. Bone marrow biopsy was negative staging. Patient is  status post cycle 1 CHOP/Rituxan on 09/06/2012, he completed cycle 4 11/09/2002 2. Restaging CT scans on 11/20/2012 confirming decreasing chest/abdominal lymphadenopathy and large mesenteric mass. Per oncology.  #7 nausea and vomiting Secondary to chemotherapy. Improved. Continue supportive care.  #8 status post Port-A-Cath placement 08/30/2012  Code Status: Stable. Family Communication: Updated patient and wife at bedside Disposition Plan: Home when medically stable   Consultants:  Hematology oncology: Dr. Gaylyn Rong 11/17/12  Infectious diseases: Dr. Orvan Falconer 11/21/2012  Procedures:  CT chest and abdomen 11/20/2012  CT of the head 11/15/2012  Chest x-ray of 11/15/2012  Lower extremity Dopplers 11/19/2012  Antibiotics:  IV vancomycin for 4/4/ 2004 to 11/18/2012  IV cefepime 11/15/2012 to  11/19/12  IV vancomycin 4/10/ 2014  IV cefepime 11/22/2012  HPI/Subjective: Patient states fevers, with night sweats and chills overnight, with episode of emesis.   Objective: Filed Vitals:   11/23/12 0730 11/23/12 1030 11/23/12 1404 11/23/12 1624  BP: 176/82 128/62 142/76   Pulse: 89  80   Temp: 99.7 F (37.6 C)  98.7 F (37.1 C) 99 F (37.2 C)  TempSrc: Oral  Oral Oral  Resp:   18   Height:      Weight:      SpO2: 93%  97%     Intake/Output Summary (Last 24 hours) at 11/23/12 1713 Last data filed at 11/23/12 1400  Gross per 24 hour  Intake   1080 ml  Output      2 ml  Net   1078 ml   Filed Weights   11/15/12 2113  Weight: 101.6 kg (223 lb 15.8 oz)    Exam:   General:  NAD  Cardiovascular: RRR  Respiratory: CTAB  Abdomen: Soft/NT/ND/+BS  Musculoskeletal: 5/5 bue STRENGTH, 5/5 ble STRENGTH  Extremities: No clubbing cyanosis or edema   Data Reviewed: Basic Metabolic Panel:  Recent Labs Lab 11/17/12 0345 11/21/12 0500 11/22/12 0605 11/23/12 0533  NA 135 137 139 136  K 3.9 4.1 4.0 3.9  CL 100 101 103 101  CO2 28 29 27 27   GLUCOSE 106* 101* 99 98   BUN 12 9 12 11   CREATININE 0.91 0.87 0.84 0.88  CALCIUM 8.3* 8.7 8.4 8.4   Liver Function Tests:  Recent Labs Lab 11/21/12 0500  AST 23  ALT 35  ALKPHOS 77  BILITOT 0.2*  PROT 5.6*  ALBUMIN 2.8*   No results found for this basename: LIPASE, AMYLASE,  in the last 168 hours No results found for this basename: AMMONIA,  in the last 168 hours CBC:  Recent Labs Lab 11/19/12 0625 11/20/12 0524 11/21/12 0500 11/22/12 0605 11/23/12 0533  WBC 9.4 9.7 9.2 8.1 7.9  NEUTROABS 7.8* 8.1* 7.5 6.7 6.7  HGB 10.0* 10.0* 10.2* 10.0* 9.8*  HCT 28.9* 29.1* 30.3* 30.3* 29.1*  MCV 86.3 86.1 86.3 86.8 86.6  PLT 128* 141* 161 204 235   Cardiac Enzymes:  Recent Labs Lab 11/16/12 2206  TROPONINI <0.30   BNP (last 3 results) No results found for this basename: PROBNP,  in the last 8760 hours CBG: No results found for this basename: GLUCAP,  in the last 168 hours  Recent Results (from the past 240 hour(s))  CULTURE, BLOOD (ROUTINE X 2)     Status: None   Collection Time    11/15/12 10:25 PM      Result Value Range Status   Specimen Description BLOOD RIGHT HAND   Final   Special Requests BOTTLES DRAWN AEROBIC AND ANAEROBIC 3CC   Final   Culture  Setup Time 11/16/2012 01:09   Final   Culture NO GROWTH 5 DAYS   Final   Report Status 11/22/2012 FINAL   Final  CULTURE, BLOOD (ROUTINE X 2)     Status: None   Collection Time    11/15/12 10:30 PM      Result Value Range Status   Specimen Description BLOOD RIGHT ARM   Final   Special Requests BOTTLES DRAWN AEROBIC AND ANAEROBIC 5CC   Final   Culture  Setup Time 11/16/2012 02:20   Final   Culture NO GROWTH 5 DAYS   Final   Report Status 11/22/2012 FINAL   Final  CULTURE, BLOOD (ROUTINE X 2)     Status: None   Collection Time    11/16/12  6:55 PM      Result Value Range Status   Specimen Description BLOOD ARM   Final   Special Requests BOTTLES DRAWN AEROBIC AND ANAEROBIC 10CC   Final   Culture  Setup Time 11/17/2012 01:48   Final    Culture NO GROWTH 5 DAYS   Final   Report Status 11/23/2012 FINAL   Final  MRSA PCR SCREENING     Status: None   Collection Time    11/17/12  8:01 AM      Result Value Range Status   MRSA by PCR NEGATIVE  NEGATIVE Final   Comment:            The GeneXpert MRSA Assay (FDA     approved for NASAL specimens     only), is one component of a     comprehensive MRSA colonization     surveillance program. It is not     intended to diagnose MRSA     infection nor to guide or     monitor treatment for  MRSA infections.  CLOSTRIDIUM DIFFICILE BY PCR     Status: None   Collection Time    11/18/12  7:49 PM      Result Value Range Status   C difficile by pcr NEGATIVE  NEGATIVE Final  CULTURE, BLOOD (ROUTINE X 2)     Status: None   Collection Time    11/20/12  8:20 PM      Result Value Range Status   Specimen Description BLOOD RIGHT HAND   Final   Special Requests BOTTLES DRAWN AEROBIC AND ANAEROBIC 10CC   Final   Culture  Setup Time 11/21/2012 01:53   Final   Culture     Final   Value:        BLOOD CULTURE RECEIVED NO GROWTH TO DATE CULTURE WILL BE HELD FOR 5 DAYS BEFORE ISSUING A FINAL NEGATIVE REPORT   Report Status PENDING   Incomplete  CULTURE, BLOOD (ROUTINE X 2)     Status: None   Collection Time    11/20/12  8:24 PM      Result Value Range Status   Specimen Description BLOOD PORTA CATH   Final   Special Requests BOTTLES DRAWN AEROBIC AND ANAEROBIC 10CC   Final   Culture  Setup Time 11/21/2012 01:53   Final   Culture     Final   Value:        BLOOD CULTURE RECEIVED NO GROWTH TO DATE CULTURE WILL BE HELD FOR 5 DAYS BEFORE ISSUING A FINAL NEGATIVE REPORT   Report Status PENDING   Incomplete  URINE CULTURE     Status: None   Collection Time    11/21/12 10:55 AM      Result Value Range Status   Specimen Description URINE, RANDOM   Final   Special Requests NONE   Final   Culture  Setup Time 11/21/2012 14:09   Final   Colony Count NO GROWTH   Final   Culture NO GROWTH   Final    Report Status 11/22/2012 FINAL   Final  CULTURE, BLOOD (ROUTINE X 2)     Status: None   Collection Time    11/22/12  8:30 AM      Result Value Range Status   Specimen Description BLOOD PORT   Final   Special Requests BOTTLES DRAWN AEROBIC AND ANAEROBIC 10CC   Final   Culture  Setup Time 11/22/2012 13:23   Final   Culture     Final   Value:        BLOOD CULTURE RECEIVED NO GROWTH TO DATE CULTURE WILL BE HELD FOR 5 DAYS BEFORE ISSUING A FINAL NEGATIVE REPORT   Report Status PENDING   Incomplete  CULTURE, BLOOD (ROUTINE X 2)     Status: None   Collection Time    11/22/12  9:46 AM      Result Value Range Status   Specimen Description BLOOD LEFT ARM   Final   Special Requests BOTTLES DRAWN AEROBIC AND ANAEROBIC 5CC   Final   Culture  Setup Time 11/22/2012 13:23   Final   Culture     Final   Value:        BLOOD CULTURE RECEIVED NO GROWTH TO DATE CULTURE WILL BE HELD FOR 5 DAYS BEFORE ISSUING A FINAL NEGATIVE REPORT   Report Status PENDING   Incomplete  MRSA PCR SCREENING     Status: None   Collection Time    11/22/12  7:57 PM      Result  Value Range Status   MRSA by PCR NEGATIVE  NEGATIVE Final   Comment:            The GeneXpert MRSA Assay (FDA     approved for NASAL specimens     only), is one component of a     comprehensive MRSA colonization     surveillance program. It is not     intended to diagnose MRSA     infection nor to guide or     monitor treatment for     MRSA infections.     Studies: No results found.  Scheduled Meds: . aspirin  650 mg Oral Daily  . ceFEPime (MAXIPIME) IV  2 g Intravenous Q8H  . enoxaparin (LOVENOX) injection  50 mg Subcutaneous Q24H  . sodium chloride  3 mL Intravenous Q12H  . sodium chloride  3 mL Intravenous Q12H  . vancomycin  1,000 mg Intravenous Q12H   Continuous Infusions:    Principal Problem:   Fever Active Problems:   Non-Hodgkin lymphoma of intra-abdominal lymph nodes   Syncope   Hyponatremia   Anemia   Nausea with  vomiting   Diarrhea   Antineoplastic chemotherapy induced pancytopenia    Time spent: > 35 mins    [facility]  [facility] Pager [phone]. If 7PM-7AM, please contact night-coverage at [email or web address], password [facility] 11/23/2012, 5:13 PM  LOS: 8 days

## 2012-11-23 NOTE — Progress Notes (Signed)
Patient ID: Adam Alvarado, male   DOB: [birth year], 56 y.o.   MRN: [id number]         [facility] Infectious Disease    Date of Admission:  11/15/2012            Day 2 vancomycin (second round)        Day 2 cefepime  Principal Problem:   Fever Active Problems:   Syncope   Hyponatremia   Nausea with vomiting   Diarrhea   Non-Hodgkin lymphoma of intra-abdominal lymph nodes   Anemia   Antineoplastic chemotherapy induced pancytopenia   . aspirin  650 mg Oral Daily  . ceFEPime (MAXIPIME) IV  2 g Intravenous Q8H  . enoxaparin (LOVENOX) injection  50 mg Subcutaneous Q24H  . sodium chloride  3 mL Intravenous Q12H  . sodium chloride  3 mL Intravenous Q12H  . vancomycin  1,000 mg Intravenous Q12H    Subjective: He had fever again last night associated with chills and sweats but noted that the fever did not last as long and he chills were not as severe. He also had one episode of vomiting. He feels perfectly fine right now.  Objective: Temp:  [98 F (36.7 C)-102.5 F (39.2 C)] 99.7 F (37.6 C) (04/11 0730) Pulse Rate:  [61-89] 89 (04/11 0730) Resp:  [18] 18 (04/11 0639) BP: (133-176)/(68-82) 176/82 mmHg (04/11 0730) SpO2:  [93 %-100 %] 93 % (04/11 0730)  General: Alert, comfortable and talkative Skin: No rash Mouth: No thrush or other lesions Lymph nodes: No palpable adenopathy Lungs: Clear Cor: Regular S1 and S2 no murmurs Abdomen: Soft and nontender with normal bowel sounds. No masses palpable Right upper chest Port-A-Cath site appears normal  Lab Results Lab Results  Component Value Date   WBC 7.9 11/23/2012   HGB 9.8* 11/23/2012   HCT 29.1* 11/23/2012   MCV 86.6 11/23/2012   PLT 235 11/23/2012    Lab Results  Component Value Date   CREATININE 0.88 11/23/2012   BUN 11 11/23/2012   NA 136 11/23/2012   K 3.9 11/23/2012   CL 101 11/23/2012   CO2 27 11/23/2012    Lab Results  Component Value Date   ALT 35 11/21/2012   AST 23 11/21/2012   ALKPHOS 77 11/21/2012   BILITOT 0.2* 11/21/2012      Microbiology: Recent Results (from the past 240 hour(s))  CULTURE, BLOOD (ROUTINE X 2)     Status: None   Collection Time    11/15/12 10:25 PM      Result Value Range Status   Specimen Description BLOOD RIGHT HAND   Final   Special Requests BOTTLES DRAWN AEROBIC AND ANAEROBIC 3CC   Final   Culture  Setup Time 11/16/2012 01:09   Final   Culture NO GROWTH 5 DAYS   Final   Report Status 11/22/2012 FINAL   Final  CULTURE, BLOOD (ROUTINE X 2)     Status: None   Collection Time    11/15/12 10:30 PM      Result Value Range Status   Specimen Description BLOOD RIGHT ARM   Final   Special Requests BOTTLES DRAWN AEROBIC AND ANAEROBIC 5CC   Final   Culture  Setup Time 11/16/2012 02:20   Final   Culture NO GROWTH 5 DAYS   Final   Report Status 11/22/2012 FINAL   Final  CULTURE, BLOOD (ROUTINE X 2)     Status: None   Collection Time    11/16/12  6:55 PM  Result Value Range Status   Specimen Description BLOOD ARM   Final   Special Requests BOTTLES DRAWN AEROBIC AND ANAEROBIC 10CC   Final   Culture  Setup Time 11/17/2012 01:48   Final   Culture NO GROWTH 5 DAYS   Final   Report Status 11/23/2012 FINAL   Final  MRSA PCR SCREENING     Status: None   Collection Time    11/17/12  8:01 AM      Result Value Range Status   MRSA by PCR NEGATIVE  NEGATIVE Final   Comment:            The GeneXpert MRSA Assay (FDA     approved for NASAL specimens     only), is one component of a     comprehensive MRSA colonization     surveillance program. It is not     intended to diagnose MRSA     infection nor to guide or     monitor treatment for     MRSA infections.  CLOSTRIDIUM DIFFICILE BY PCR     Status: None   Collection Time    11/18/12  7:49 PM      Result Value Range Status   C difficile by pcr NEGATIVE  NEGATIVE Final  CULTURE, BLOOD (ROUTINE X 2)     Status: None   Collection Time    11/20/12  8:20 PM      Result Value Range Status   Specimen Description BLOOD  RIGHT HAND   Final   Special Requests BOTTLES DRAWN AEROBIC AND ANAEROBIC 10CC   Final   Culture  Setup Time 11/21/2012 01:53   Final   Culture     Final   Value:        BLOOD CULTURE RECEIVED NO GROWTH TO DATE CULTURE WILL BE HELD FOR 5 DAYS BEFORE ISSUING A FINAL NEGATIVE REPORT   Report Status PENDING   Incomplete  CULTURE, BLOOD (ROUTINE X 2)     Status: None   Collection Time    11/20/12  8:24 PM      Result Value Range Status   Specimen Description BLOOD PORTA CATH   Final   Special Requests BOTTLES DRAWN AEROBIC AND ANAEROBIC 10CC   Final   Culture  Setup Time 11/21/2012 01:53   Final   Culture     Final   Value:        BLOOD CULTURE RECEIVED NO GROWTH TO DATE CULTURE WILL BE HELD FOR 5 DAYS BEFORE ISSUING A FINAL NEGATIVE REPORT   Report Status PENDING   Incomplete  URINE CULTURE     Status: None   Collection Time    11/21/12 10:55 AM      Result Value Range Status   Specimen Description URINE, RANDOM   Final   Special Requests NONE   Final   Culture  Setup Time 11/21/2012 14:09   Final   Colony Count NO GROWTH   Final   Culture NO GROWTH   Final   Report Status 11/22/2012 FINAL   Final  CULTURE, BLOOD (ROUTINE X 2)     Status: None   Collection Time    11/22/12  8:30 AM      Result Value Range Status   Specimen Description BLOOD PORT   Final   Special Requests BOTTLES DRAWN AEROBIC AND ANAEROBIC 10CC   Final   Culture  Setup Time 11/22/2012 13:23   Final   Culture     Final  Value:        BLOOD CULTURE RECEIVED NO GROWTH TO DATE CULTURE WILL BE HELD FOR 5 DAYS BEFORE ISSUING A FINAL NEGATIVE REPORT   Report Status PENDING   Incomplete  CULTURE, BLOOD (ROUTINE X 2)     Status: None   Collection Time    11/22/12  9:46 AM      Result Value Range Status   Specimen Description BLOOD LEFT ARM   Final   Special Requests BOTTLES DRAWN AEROBIC AND ANAEROBIC 5CC   Final   Culture  Setup Time 11/22/2012 13:23   Final   Culture     Final   Value:        BLOOD CULTURE  RECEIVED NO GROWTH TO DATE CULTURE WILL BE HELD FOR 5 DAYS BEFORE ISSUING A FINAL NEGATIVE REPORT   Report Status PENDING   Incomplete  MRSA PCR SCREENING     Status: None   Collection Time    11/22/12  7:57 PM      Result Value Range Status   MRSA by PCR NEGATIVE  NEGATIVE Final   Comment:            The GeneXpert MRSA Assay (FDA     approved for NASAL specimens     only), is one component of a     comprehensive MRSA colonization     surveillance program. It is not     intended to diagnose MRSA     infection nor to guide or     monitor treatment for     MRSA infections.   Assessment: They're still no definite cause for his fevers. He feels like his fevers, chills and sweats were not as severe last night and he may be beginning to respond to reinstitution of empiric antibiotics. I will follow up in the morning.  Plan: 1. Continue vancomycin and cefepime  [name], MD [facility] for Infectious Disease [facility] [phone] pager   [phone] cell 11/23/2012, 9:36 AM

## 2012-11-23 NOTE — Progress Notes (Signed)
Sister and brother-in-law were also in the room at time of visit.  Offered support. The patient was appreciative of the visit but has had a number of other pastors visit him. Good spiritual support. He talked about his illness and hopes doctors will find out why he is having the spiking fevers. Presence; listening; support.

## 2012-11-24 DIAGNOSIS — R110 Nausea: Secondary | ICD-10-CM

## 2012-11-24 DIAGNOSIS — R1900 Intra-abdominal and pelvic swelling, mass and lump, unspecified site: Secondary | ICD-10-CM

## 2012-11-24 DIAGNOSIS — R599 Enlarged lymph nodes, unspecified: Secondary | ICD-10-CM

## 2012-11-24 LAB — BASIC METABOLIC PANEL
CO2: 26 mEq/L (ref 19–32)
Chloride: 104 mEq/L (ref 96–112)
Creatinine, Ser: 0.87 mg/dL (ref 0.50–1.35)
GFR calc Af Amer: >90 mL/min (ref >90)
Potassium: 3.8 mEq/L (ref 3.5–5.1)

## 2012-11-24 LAB — CBC
HCT: 29.7 % — ABNORMAL LOW (ref 39.0–52.0)
MCV: 85.8 fL (ref 78.0–100.0)
Platelets: 262 10*3/uL (ref 150–400)
RBC: 3.46 MIL/uL — ABNORMAL LOW (ref 4.22–5.81)
RDW: 15.1 % (ref 11.5–15.5)
WBC: 8.9 10*3/uL (ref 4.0–10.5)

## 2012-11-24 LAB — VANCOMYCIN, TROUGH: Vancomycin Tr: 9.9 ug/mL — ABNORMAL LOW (ref 10.0–20.0)

## 2012-11-24 MED ORDER — VANCOMYCIN HCL 10 G IV SOLR
1500.0000 mg | Freq: Two times a day (BID) | INTRAVENOUS | Status: DC
  Administered 2012-11-24 – 2012-11-27 (×6): 1500 mg via INTRAVENOUS
  Filled 2012-11-24 (×7): qty 1500

## 2012-11-24 NOTE — Progress Notes (Signed)
IP PROGRESS NOTE  Subjective:   Chills noted overnight but no fever reported.  Clinically about the same.   Objective: Vital signs in last 24 hours: Blood pressure 132/71, pulse 75, temperature 98.6 F (37 C), temperature source Oral, resp. rate 20, height 5' 10.08" (1.78 m), weight 223 lb 15.8 oz (101.6 kg), SpO2 93.00%.  Intake/Output from previous day: 04/11 0701 - 04/12 0700 In: 1080 [P.O.:840; I.V.:240] Out: 2 [Stool:2]  Physical Exam:  HEENT: No thrush or ulcers, no sinus tenderness Lungs: Clear bilaterally Cardiac: Regular rate and rhythm Abdomen: Nontender, no hepatosplenomegaly, no mass Extremities: No leg edema Skin: No rash   Portacath-without erythema or tenderness   Lab Results:  Recent Labs  11/23/12 0533 11/24/12 0555  WBC 7.9 8.9  HGB 9.8* 10.0*  HCT 29.1* 29.7*  PLT 235 262   ANC 6.7  BMET  Recent Labs  11/22/12 0605 11/23/12 0533  NA 139 136  K 4.0 3.9  CL 103 101  CO2 27 27  GLUCOSE 99 98  BUN 12 11  CREATININE 0.84 0.88  CALCIUM 8.4 8.4   LDH 217 Studies/Results: No results found.  Medications: I have reviewed the patient's current medications.  Assessment/Plan:  1. Large abdominal mass and surrounding small lymph nodes status post CT-guided biopsy on 07/20/2012 with the pathology suggestive of non-Hodgkin's lymphoma. Status post laparoscopic biopsy of the left abdominal mass on 08/21/2011 with final pathology confirming a large B-cell lymphoma, CD20 positive, with both follicular and diffuse patterns. Negative staging bone marrow biopsy. Status post cycle 1 CHOP/Rituxan on 09/06/2012 , he completed cycle 4 on 11/08/2012. Restaging CT scans on 11/20/2012 confirmed a decrease in the chest/abdominal lymphadenopathy and large mesenteric mass 2. Nausea/vomiting on admission-likely secondary chemotherapy despite Aloxi/emend/Decadron, he continues to have episodes of nausea and vomiting when he has a fever. 3. Status post Port-A-Cath  placement 08/30/2012. 4. Neutropenia following cycle 2 CHOP/Rituxan. Neulasta was added beginning with cycle 3. 5. Syncope event 11/15/2012-likely related to dehydration       6.   "Night sweats "-persistent.       7.    pancytopenia secondary to chemotherapy, he received Neulasta on 11/09/2012 , the neutropenia and thrombocytopenia have resolved, persistent anemia secondary to chemotherapy and probably phlebotomy       8.    Fever in the setting of neutropenia-no apparent source for infection, cultures remain negative, vancomycin/cefepime resumed on 11/22/2012. no source for infection on the CT scans 11/20/2012.       9.    diarrhea-C. difficile toxin negative on 11/18/2012  Patient clinically unchanged. Low grade temp noted. Wife was asking about the possibility of this being PCP. For now would recommend:   1. Continue antibiotics as is for now per ID.  2. Consider adding antifungal coverage if fevers persist.  3. Consider Port-A-Cath removal if the fever persists while on antibiotics. 4. I doubt this is due to PCP with lack of pulmonary findings but certainly a consideration,         LOS: 9 days   Sherron Mummert  11/24/2012, 7:03 AM

## 2012-11-24 NOTE — Progress Notes (Signed)
Patient ID: Adam Alvarado, male   DOB: [birth year], 57 y.o.   MRN: [id number]         [facility] for Infectious Disease    Date of Admission:  11/15/2012   Total days of antibiotics 3         Principal Problem:   Fever Active Problems:   Syncope   Hyponatremia   Nausea with vomiting   Diarrhea   Non-Hodgkin lymphoma of intra-abdominal lymph nodes   Anemia   Antineoplastic chemotherapy induced pancytopenia   . aspirin  650 mg Oral Daily  . ceFEPime (MAXIPIME) IV  2 g Intravenous Q8H  . enoxaparin (LOVENOX) injection  50 mg Subcutaneous Q24H  . sodium chloride  3 mL Intravenous Q12H  . sodium chloride  3 mL Intravenous Q12H  . vancomycin  1,000 mg Intravenous Q12H    Subjective: He is feeling much better. He only had one brief, mild chills around 5 AM.  Objective: Temp:  [98.6 F (37 C)-99.7 F (37.6 C)] 99.7 F (37.6 C) (04/12 0600) Pulse Rate:  [75-80] 80 (04/12 0600) Resp:  [18-20] 20 (04/12 0600) BP: (128-166)/(62-82) 150/74 mmHg (04/12 0705) SpO2:  [93 %-97 %] 94 % (04/12 0600)  General: He looks well Skin: No rash Lungs: Clear Cor: Regular S1 and S2 no murmurs Abdomen: Soft and nontender Port-A-Cath site appears normal  Lab Results Lab Results  Component Value Date   WBC 8.9 11/24/2012   HGB 10.0* 11/24/2012   HCT 29.7* 11/24/2012   MCV 85.8 11/24/2012   PLT 262 11/24/2012    Assessment: His fevers have responded promptly to reinstitution of antibacterial therapy. This suggests that the fevers are likely due to occult infection of his Port-A-Cath. There is no other more obvious source. I discussed what I thought were the options at this point with Mr. Andrew Au and his wife. We could treat for 2 weeks his current antibiotics through his existing Port-A-Cath hoping to cure the infection. At the other extreme week ago and remove the Port-A-Cath, place a PICC and complete therapy with IV antibiotics. The latter option is much more likely to completely cure his  fevers and minimize the delay in reinstitution chemotherapy. He is in agreement with proceeding with Port-A-Cath removal. I've spoken to Dr. Clelia Croft and he is in agreement as well.  Plan: 1. Continue current antibiotics plan the Port-A-Cath removal and PICC placement 2. Home health evaluation for outpatient IV antibiotics  [name], MD [facility] for Infectious Disease [facility] [phone] pager   [phone] cell 11/24/2012, 10:13 AM

## 2012-11-24 NOTE — Progress Notes (Signed)
ANTIBIOTIC CONSULT NOTE - FOLLOW UP  Pharmacy Consult for Vancomycin/Cefepime Indication: Fever of unknown etiology  No Known Allergies  Patient Measurements: Height: 5' 10.08" (178 cm) Weight: 223 lb 15.8 oz (101.6 kg) IBW/kg (Calculated) : 73.18  Vital Signs: Temp: 99.7 F (37.6 C) (04/12 0600) Temp src: Oral (04/12 0600) BP: 150/74 mmHg (04/12 0705) Pulse Rate: 80 (04/12 0600) Intake/Output from previous day: 04/11 0701 - 04/12 0700 In: 1080 [P.O.:840; I.V.:240] Out: 2 [Stool:2] Intake/Output from this shift:    Labs:  Recent Labs  11/22/12 0605 11/23/12 0533 11/24/12 0555  WBC 8.1 7.9 8.9  HGB 10.0* 9.8* 10.0*  PLT 204 235 262  CREATININE 0.84 0.88 0.87   Estimated Creatinine Clearance: 114.8 ml/min (by C-G formula based on Cr of 0.87).  96 ml/min/1.55m2 (normalized)    Microbiology: 4/3 Blood >> NGF 4/4 Blood >> NGF 4/5 MRSA PCR >> negative 4/6 C.diff PCR >> negative 4/8 Blood (PAC) >> NGtd 4/9 Urine >> NGF 4/10Blood >> NGTD  Antibiotics: 4/3 Vanc >> 4/6 4/3 Cefepime >> 4/7  4/10 Resume Vanc >> 4/10 Resume Cefepime >>  Assessment:  35 yom with h/o non-Hodgkin's lymphoma currently undergoing chemotherapy (last chemo 11/08/12, with Neulasta 11/09/12).  Was being observed off antibiotics but continued to spike fevers with chills and night sweats, therefore Vanc/Cefepime resumed 4/10.  ID is following patient, and agree with current plan. Onc considering addition of antifungal if fevers persist; may consider removing PAC also.  Tmax 99.7 this am, WBC normal (neutropenia resolved 4/4), SCr stable with CrCl ~100 ml/min  All cultures negative to date.   Goal of Therapy:  Vancomycin trough level 15-20 mcg/ml  Plan:   Check Vancomycin trough tonight  Continue Cefepime 2g IV q8h Follow up renal function & cultures   Loralee Pacas, PharmD, BCPS Pager: [phone] 11/24/2012,9:57 AM

## 2012-11-24 NOTE — Progress Notes (Signed)
Vancomycin Consult - Follow Up  Please refer to pharmacy notes written earlier today by Loralee Pacas, [facility] for more details.  Vanncomycin trough returns as 9.9  (low, goal of 15-20) on Vancomycin 1gm IV q12h.  Renal function stable.   Plan: Increase Vancomycin to 1500 mg IV q12h.  Pharmacy will f/u.    Geoffry Paradise, PharmD, BCPS Pager: [phone] 8:30 PM Pharmacy #: [id number]

## 2012-11-24 NOTE — Progress Notes (Signed)
[facility] PROGRESS NOTE  Adam Alvarado ZOX:[id number] DOB: [birth year] DOA: 11/15/2012 PCP: [name], MD  Assessment/Plan: #1 neutropenic fever/fever Patient initially was noted to have low-grade temperatures on admission however 2 days ago still spiking temperatures up to 102.5. No fevers overnight. Urinalysis is negative. Urine cultures have been negative. Blood cultures with no growth to date. CT of the abdomen and pelvis negative for any source of infection. Patient was on IV vancomycin and cefepime for approximately 3 days which was subsequently discontinued. Patient with ongoing fevers as of yesterday. Repeat blood cultures pending. Per ID patiient likely with possible porta cath infection. Per ID will need porta cath removed and tip cultured. Placement of PICC line for 2 weeks IV antibiotics. Continue IV vancomycin and cefepime per ID recommendations. Continue antipyretics. ID following and appreciate their recommendations.   #2 syncope Likely vasovagal in the setting of dehydration/orthostasis. Patient was noted to be orthostatic on 11/16/2012 and hydrate with IV fluids. Cardiac enzymes have been negative. EKG is reassuring. 2-D echo in the EF of 60-65% with no wall motion abnormalities from 08/31/2012. CT of the head was negative. No further episodes of syncope. NSL IVF. Follow.  #3 diarrhea Improved. Patient states had a solid bowel movement. C. difficile PCR has been negative. Imodium as needed.  #4 pancytopenia secondary to chemotherapy Patient received the last on 11/09/2012. Neutropenia, thrombocytopenia have improved. Anemia stable. Follow.  #5 night sweats  Questionable etiology. Follow. May be secondary to B symptoms versus occult infection. Cultures pending. Follow.  #6 B-cell lymphoma large abdominal mass and surrounding small lymph nodes status post CT-guided biopsy on 07/20/2012 suggestive of non-Hodgkin's lymphoma. Status post laparoscopic biopsy of left abdominal  mass 08/21/2011 with final pathology confirming a large B-cell lymphoma, CD20 positive with both follicular and diffuse patterns. Bone marrow biopsy was negative staging. Patient is status post cycle 1 CHOP/Rituxan on 09/06/2012, he completed cycle 4 11/09/2002 2. Restaging CT scans on 11/20/2012 confirming decreasing chest/abdominal lymphadenopathy and large mesenteric mass. Per oncology.  #7 nausea and vomiting Secondary to chemotherapy. Improved. Continue supportive care.  #8 status post Port-A-Cath placement 08/30/2012  Code Status: Stable. Family Communication: Updated patient and wife at bedside Disposition Plan: Home when medically stable   Consultants:  Hematology oncology: Dr. Gaylyn Rong 11/17/12  Infectious diseases: Dr. Orvan Falconer 11/21/2012  Procedures:  CT chest and abdomen 11/20/2012  CT of the head 11/15/2012  Chest x-ray of 11/15/2012  Lower extremity Dopplers 11/19/2012  Antibiotics:  IV vancomycin for 4/4/ 2004 to 11/18/2012  IV cefepime 11/15/2012 to  11/19/12  IV vancomycin 4/10/ 2014  IV cefepime 11/22/2012  HPI/Subjective: Patient with no fevers overnight. Patient endorsing chills overnight.  Objective: Filed Vitals:   11/24/12 0600 11/24/12 0630 11/24/12 0705 11/24/12 1102  BP: 166/79 165/82 150/74   Pulse: 80     Temp: 99.7 F (37.6 C)   98.6 F (37 C)  TempSrc: Oral   Oral  Resp: 20     Height:      Weight:      SpO2: 94%       Intake/Output Summary (Last 24 hours) at 11/24/12 1217 Last data filed at 11/24/12 0600  Gross per 24 hour  Intake    240 ml  Output      2 ml  Net    238 ml   Filed Weights   11/15/12 2113  Weight: 101.6 kg (223 lb 15.8 oz)    Exam:   General:  NAD  Cardiovascular: RRR  Respiratory:  CTAB  Abdomen: Soft/NT/ND/+BS  Musculoskeletal: 5/5 bue STRENGTH, 5/5 ble STRENGTH  Extremities: No clubbing cyanosis or edema   Data Reviewed: Basic Metabolic Panel:  Recent Labs Lab 11/21/12 0500 11/22/12 0605  11/23/12 0533 11/24/12 0555  NA 137 139 136 139  K 4.1 4.0 3.9 3.8  CL 101 103 101 104  CO2 29 27 27 26   GLUCOSE 101* 99 98 110*  BUN 9 12 11 13   CREATININE 0.87 0.84 0.88 0.87  CALCIUM 8.7 8.4 8.4 8.7   Liver Function Tests:  Recent Labs Lab 11/21/12 0500  AST 23  ALT 35  ALKPHOS 77  BILITOT 0.2*  PROT 5.6*  ALBUMIN 2.8*   No results found for this basename: LIPASE, AMYLASE,  in the last 168 hours No results found for this basename: AMMONIA,  in the last 168 hours CBC:  Recent Labs Lab 11/19/12 0625 11/20/12 0524 11/21/12 0500 11/22/12 0605 11/23/12 0533 11/24/12 0555  WBC 9.4 9.7 9.2 8.1 7.9 8.9  NEUTROABS 7.8* 8.1* 7.5 6.7 6.7  --   HGB 10.0* 10.0* 10.2* 10.0* 9.8* 10.0*  HCT 28.9* 29.1* 30.3* 30.3* 29.1* 29.7*  MCV 86.3 86.1 86.3 86.8 86.6 85.8  PLT 128* 141* 161 204 235 262   Cardiac Enzymes: No results found for this basename: CKTOTAL, CKMB, CKMBINDEX, TROPONINI,  in the last 168 hours BNP (last 3 results) No results found for this basename: PROBNP,  in the last 8760 hours CBG: No results found for this basename: GLUCAP,  in the last 168 hours  Recent Results (from the past 240 hour(s))  CULTURE, BLOOD (ROUTINE X 2)     Status: None   Collection Time    11/15/12 10:25 PM      Result Value Range Status   Specimen Description BLOOD RIGHT HAND   Final   Special Requests BOTTLES DRAWN AEROBIC AND ANAEROBIC 3CC   Final   Culture  Setup Time 11/16/2012 01:09   Final   Culture NO GROWTH 5 DAYS   Final   Report Status 11/22/2012 FINAL   Final  CULTURE, BLOOD (ROUTINE X 2)     Status: None   Collection Time    11/15/12 10:30 PM      Result Value Range Status   Specimen Description BLOOD RIGHT ARM   Final   Special Requests BOTTLES DRAWN AEROBIC AND ANAEROBIC 5CC   Final   Culture  Setup Time 11/16/2012 02:20   Final   Culture NO GROWTH 5 DAYS   Final   Report Status 11/22/2012 FINAL   Final  CULTURE, BLOOD (ROUTINE X 2)     Status: None   Collection  Time    11/16/12  6:55 PM      Result Value Range Status   Specimen Description BLOOD ARM   Final   Special Requests BOTTLES DRAWN AEROBIC AND ANAEROBIC 10CC   Final   Culture  Setup Time 11/17/2012 01:48   Final   Culture NO GROWTH 5 DAYS   Final   Report Status 11/23/2012 FINAL   Final  MRSA PCR SCREENING     Status: None   Collection Time    11/17/12  8:01 AM      Result Value Range Status   MRSA by PCR NEGATIVE  NEGATIVE Final   Comment:            The GeneXpert MRSA Assay (FDA     approved for NASAL specimens     only), is one component of a  comprehensive MRSA colonization     surveillance program. It is not     intended to diagnose MRSA     infection nor to guide or     monitor treatment for     MRSA infections.  CLOSTRIDIUM DIFFICILE BY PCR     Status: None   Collection Time    11/18/12  7:49 PM      Result Value Range Status   C difficile by pcr NEGATIVE  NEGATIVE Final  CULTURE, BLOOD (ROUTINE X 2)     Status: None   Collection Time    11/20/12  8:20 PM      Result Value Range Status   Specimen Description BLOOD RIGHT HAND   Final   Special Requests BOTTLES DRAWN AEROBIC AND ANAEROBIC 10CC   Final   Culture  Setup Time 11/21/2012 01:53   Final   Culture     Final   Value:        BLOOD CULTURE RECEIVED NO GROWTH TO DATE CULTURE WILL BE HELD FOR 5 DAYS BEFORE ISSUING A FINAL NEGATIVE REPORT   Report Status PENDING   Incomplete  CULTURE, BLOOD (ROUTINE X 2)     Status: None   Collection Time    11/20/12  8:24 PM      Result Value Range Status   Specimen Description BLOOD PORTA CATH   Final   Special Requests BOTTLES DRAWN AEROBIC AND ANAEROBIC 10CC   Final   Culture  Setup Time 11/21/2012 01:53   Final   Culture     Final   Value:        BLOOD CULTURE RECEIVED NO GROWTH TO DATE CULTURE WILL BE HELD FOR 5 DAYS BEFORE ISSUING A FINAL NEGATIVE REPORT   Report Status PENDING   Incomplete  URINE CULTURE     Status: None   Collection Time    11/21/12 10:55 AM       Result Value Range Status   Specimen Description URINE, RANDOM   Final   Special Requests NONE   Final   Culture  Setup Time 11/21/2012 14:09   Final   Colony Count NO GROWTH   Final   Culture NO GROWTH   Final   Report Status 11/22/2012 FINAL   Final  CULTURE, BLOOD (ROUTINE X 2)     Status: None   Collection Time    11/22/12  8:30 AM      Result Value Range Status   Specimen Description BLOOD PORT   Final   Special Requests BOTTLES DRAWN AEROBIC AND ANAEROBIC 10CC   Final   Culture  Setup Time 11/22/2012 13:23   Final   Culture     Final   Value:        BLOOD CULTURE RECEIVED NO GROWTH TO DATE CULTURE WILL BE HELD FOR 5 DAYS BEFORE ISSUING A FINAL NEGATIVE REPORT   Report Status PENDING   Incomplete  CULTURE, BLOOD (ROUTINE X 2)     Status: None   Collection Time    11/22/12  9:46 AM      Result Value Range Status   Specimen Description BLOOD LEFT ARM   Final   Special Requests BOTTLES DRAWN AEROBIC AND ANAEROBIC 5CC   Final   Culture  Setup Time 11/22/2012 13:23   Final   Culture     Final   Value:        BLOOD CULTURE RECEIVED NO GROWTH TO DATE CULTURE WILL BE HELD FOR 5 DAYS BEFORE  ISSUING A FINAL NEGATIVE REPORT   Report Status PENDING   Incomplete  MRSA PCR SCREENING     Status: None   Collection Time    11/22/12  7:57 PM      Result Value Range Status   MRSA by PCR NEGATIVE  NEGATIVE Final   Comment:            The GeneXpert MRSA Assay (FDA     approved for NASAL specimens     only), is one component of a     comprehensive MRSA colonization     surveillance program. It is not     intended to diagnose MRSA     infection nor to guide or     monitor treatment for     MRSA infections.     Studies: No results found.  Scheduled Meds: . aspirin  650 mg Oral Daily  . ceFEPime (MAXIPIME) IV  2 g Intravenous Q8H  . enoxaparin (LOVENOX) injection  50 mg Subcutaneous Q24H  . sodium chloride  3 mL Intravenous Q12H  . sodium chloride  3 mL Intravenous Q12H  .  vancomycin  1,000 mg Intravenous Q12H   Continuous Infusions:    Principal Problem:   Fever Active Problems:   Non-Hodgkin lymphoma of intra-abdominal lymph nodes   Syncope   Hyponatremia   Anemia   Nausea with vomiting   Diarrhea   Antineoplastic chemotherapy induced pancytopenia    Time spent: > 35 mins    [facility]  [facility] Pager [phone]. If 7PM-7AM, please contact night-coverage at [email or web address], password [facility] 11/24/2012, 12:17 PM  LOS: 9 days

## 2012-11-24 NOTE — Progress Notes (Signed)
Aware of request for IR to remove Portacath  As suspected source of persistent fevers. Port placed 08/30/12 and has been functioning fine. Will review with IR MD, but tentatively planning for Mon 4/14.

## 2012-11-25 ENCOUNTER — Encounter (HOSPITAL_COMMUNITY): Payer: Self-pay | Admitting: Radiology

## 2012-11-25 LAB — PROTIME-INR: INR: 1.14 (ref 0.00–1.49)

## 2012-11-25 LAB — BASIC METABOLIC PANEL
Calcium: 8.5 mg/dL (ref 8.4–10.5)
GFR calc Af Amer: >90 mL/min (ref >90)
GFR calc non Af Amer: >90 mL/min (ref >90)
Potassium: 3.9 mEq/L (ref 3.5–5.1)
Sodium: 138 mEq/L (ref 135–145)

## 2012-11-25 LAB — CBC
Hemoglobin: 10 g/dL — ABNORMAL LOW (ref 13.0–17.0)
MCH: 28.7 pg (ref 26.0–34.0)
Platelets: 268 10*3/uL (ref 150–400)
RBC: 3.48 MIL/uL — ABNORMAL LOW (ref 4.22–5.81)
WBC: 7.2 10*3/uL (ref 4.0–10.5)

## 2012-11-25 NOTE — Progress Notes (Signed)
Patient ID: Adam Alvarado, male   DOB: Jan 12, 1956, 57 y.o.   MRN: [id number]         [facility]    Date of Admission:  11/15/2012           Day 4 antibiotics  Principal Problem:   Fever Active Problems:   Syncope   Hyponatremia   Nausea with vomiting   Diarrhea   Non-Hodgkin lymphoma of intra-abdominal lymph nodes   Anemia   Antineoplastic chemotherapy induced pancytopenia   . aspirin  650 mg Oral Daily  . ceFEPime (MAXIPIME) IV  2 g Intravenous Q8H  . enoxaparin (LOVENOX) injection  50 mg Subcutaneous Q24H  . sodium chloride  3 mL Intravenous Q12H  . sodium chloride  3 mL Intravenous Q12H  . vancomycin  1,500 mg Intravenous Q12H    Subjective: He had one sweat last night without any obvious fever or chills. Overall he is feeling much better.  Objective: Temp:  [98.3 F (36.8 C)-99.2 F (37.3 C)] 98.3 F (36.8 C) (04/13 0600) Pulse Rate:  [72-84] 72 (04/13 0600) Resp:  [20] 20 (04/13 0600) BP: (130-146)/(73-81) 146/79 mmHg (04/13 0600) SpO2:  [91 %-97 %] 95 % (04/13 0600)  General: He appears perfectly well Skin: No rash. Port-A-Cath site appears normal Lungs: Clear Cor: Regular S1 and S2 no murmurs Abdomen: Soft and nontender  Lab Results Lab Results  Component Value Date   WBC 7.2 11/25/2012   HGB 10.0* 11/25/2012   HCT 30.1* 11/25/2012   MCV 86.5 11/25/2012   PLT 268 11/25/2012   All blood cultures remain negative  Assessment: He is improving on empiric therapy for her presumed culture-negative central line associated bloodstream infection.  Plan: 1. Continue vancomycin and cefepime for 10 more days 2. Remove Port-A-Cath and had a PICC placed tomorrow  [name], MD [facility] [phone] pager   [phone] cell 11/25/2012, 11:42 AM

## 2012-11-25 NOTE — H&P (Signed)
HPI: Adam Alvarado is an 57 y.o. male with hx of Lymphoma who had port placed on 1/16. This has functioned well without issue. However, he has recently developed fevers of uncertain etiology, but has responded to IV abx and therefore, there is concern that his port may be the source of infection. ID and Onc feel it would be best to remove port and place PICC line for the duration of his chemo treatments. He states he has 2 more to go. He denies pain at his port site. PMHx and meds reviewed.  Past Medical History:  Past Medical History  Diagnosis Date  . Cancer 2013    Non-Hodgkins Lymphoma  . Other malignant lymphomas, unspecified site, extranodal and solid organ sites 2013  . Hemorrhoid   . H/O hiatal hernia     small seen on CT Scan    Past Surgical History:  Past Surgical History  Procedure Laterality Date  . Vasectomy    . Vasectomy  1998  . No past surgeries    . Laparoscopy  08/20/2012    Procedure: LAPAROSCOPY DIAGNOSTIC;  Surgeon: Ernestene Mention, MD;  Location: [facility] OR;  Service: General;  Laterality: N/A;  . Esophageal biopsy  08/20/2012    Procedure: BIOPSY;  Surgeon: Ernestene Mention, MD;  Location: [facility] OR;  Service: General;  Laterality: N/A;  Laparoscopic biopsy of abdominal mesenteric mass    Family History:  Family History  Problem Relation Age of Onset  . Cancer Mother     Breast    Social History:  reports that he has never smoked. He has never used smokeless tobacco. He reports that  drinks alcohol. He reports that he does not use illicit drugs.  Allergies: No Known Allergies  Medications: Medications Prior to Admission  Medication Sig Dispense Refill  . baclofen (LIORESAL) 10 MG tablet Take 0.5 tablets (5 mg total) by mouth 3 (three) times daily as needed (FOR HICCUPS).  30 each  0  . lidocaine-prilocaine (EMLA) cream Apply topically as needed. Apply to [facility] 2 hours prior to stick and cover with plastic wrap to numb site  30 g  prn  . LORazepam (ATIVAN) 1  MG tablet Place 1 tablet (1 mg total) under the tongue every 6 (six) hours as needed (nausea).  30 tablet  1  . predniSONE (DELTASONE) 20 MG tablet Take 5 tablets (100 mg total) by mouth daily. X 5 days Begin on day 1 of each chemo cycle  50 tablet  2  . prochlorperazine (COMPAZINE) 10 MG tablet Take 1 tablet (10 mg total) by mouth every 6 (six) hours as needed (nausea).  60 tablet  2    Please HPI for pertinent positives, otherwise complete 10 system ROS negative.  Physical Exam: Blood pressure 146/79, pulse 72, temperature 98.3 F (36.8 C), temperature source Oral, resp. rate 20, height 5' 10.08" (1.78 m), weight 223 lb 15.8 oz (101.6 kg), SpO2 95.00%. Body mass index is 32.07 kg/(m^2).   General Appearance:  Alert, cooperative, no distress, appears stated age  Head:  Normocephalic, without obvious abnormality, atraumatic  ENT: Unremarkable  Neck: Supple, symmetrical, trachea midline,  Lungs:   Clear to auscultation bilaterally, no w/r/r, respirations unlabored without use of accessory muscles.  Chest Wall:  Rt port site without erythema, tenderness.  Heart:  Regular rate and rhythm, S1, S2 normal, no murmur, rub or gallop.   Neurologic: Normal affect, no gross deficits.   Results for orders placed during the hospital encounter of 11/15/12 (  from the past 48 hour(s))  CBC     Status: Abnormal   Collection Time    11/24/12  5:55 AM      Result Value Range   WBC 8.9  4.0 - 10.5 K/uL   RBC 3.46 (*) 4.22 - 5.81 MIL/uL   Hemoglobin 10.0 (*) 13.0 - 17.0 g/dL   HCT 04.5 (*) 40.9 - 81.1 %   MCV 85.8  78.0 - 100.0 fL   MCH 28.9  26.0 - 34.0 pg   MCHC 33.7  30.0 - 36.0 g/dL   RDW 91.4  78.2 - 95.6 %   Platelets 262  150 - 400 K/uL  BASIC METABOLIC PANEL     Status: Abnormal   Collection Time    11/24/12  5:55 AM      Result Value Range   Sodium 139  135 - 145 mEq/L   Potassium 3.8  3.5 - 5.1 mEq/L   Chloride 104  96 - 112 mEq/L   CO2 26  19 - 32 mEq/L   Glucose, Bld 110 (*) 70 - 99  mg/dL   BUN 13  6 - 23 mg/dL   Creatinine, Ser 2.13  0.50 - 1.35 mg/dL   Calcium 8.7  8.4 - 08.6 mg/dL   GFR calc non Af Amer >90  >90 mL/min   GFR calc Af Amer >90  >90 mL/min   Comment:            The eGFR has been calculated     using the CKD EPI equation.     This calculation has not been     validated in all clinical     situations.     eGFR's persistently     <90 mL/min signify     possible Chronic Kidney Disease.  VANCOMYCIN, TROUGH     Status: Abnormal   Collection Time    11/24/12  7:46 PM      Result Value Range   Vancomycin Tr 9.9 (*) 10.0 - 20.0 ug/mL  PROTIME-INR     Status: None   Collection Time    11/25/12  5:32 AM      Result Value Range   Prothrombin Time 14.4  11.6 - 15.2 seconds   INR 1.14  0.00 - 1.49  BASIC METABOLIC PANEL     Status: Abnormal   Collection Time    11/25/12  5:32 AM      Result Value Range   Sodium 138  135 - 145 mEq/L   Potassium 3.9  3.5 - 5.1 mEq/L   Chloride 104  96 - 112 mEq/L   CO2 25  19 - 32 mEq/L   Glucose, Bld 101 (*) 70 - 99 mg/dL   BUN 13  6 - 23 mg/dL   Creatinine, Ser 5.78  0.50 - 1.35 mg/dL   Calcium 8.5  8.4 - 46.9 mg/dL   GFR calc non Af Amer >90  >90 mL/min   GFR calc Af Amer >90  >90 mL/min   Comment:            The eGFR has been calculated     using the CKD EPI equation.     This calculation has not been     validated in all clinical     situations.     eGFR's persistently     <90 mL/min signify     possible Chronic Kidney Disease.  CBC     Status: Abnormal   Collection Time  11/25/12  5:32 AM      Result Value Range   WBC 7.2  4.0 - 10.5 K/uL   RBC 3.48 (*) 4.22 - 5.81 MIL/uL   Hemoglobin 10.0 (*) 13.0 - 17.0 g/dL   HCT 09.8 (*) 11.9 - 14.7 %   MCV 86.5  78.0 - 100.0 fL   MCH 28.7  26.0 - 34.0 pg   MCHC 33.2  30.0 - 36.0 g/dL   RDW 82.9  56.2 - 13.0 %   Platelets 268  150 - 400 K/uL   No results found.  Assessment/Plan Persistent fevers concerning for infected portacath Discussed with ID,  plan for removal of port and placement of PICC tomorrow in IR. Would wait on new PICC until port removed. Pt has peripheral IV that is functioning well. Labs reviewed, will check coags. Consent signed in chart  Brayton El PA-C 11/25/2012, 9:09 AM

## 2012-11-25 NOTE — Progress Notes (Signed)
[facility] PROGRESS NOTE  Adam Alvarado WUJ:[id number] DOB: 1956-01-01 DOA: 11/15/2012 PCP: [name], MD  Assessment/Plan: #1 neutropenic fever/fever Patient initially was noted to have low-grade temperatures on admission however 2 days ago still spiking temperatures up to 102.5. No fevers overnight. Urinalysis is negative. Urine cultures have been negative. Blood cultures with no growth to date. CT of the abdomen and pelvis negative for any source of infection. Patient was on IV vancomycin and cefepime for approximately 3 days which was subsequently discontinued. Patient with ongoing fevers as of yesterday. Repeat blood cultures pending. Per ID patiient likely with possible porta cath infection. Per ID will need porta cath removed and tip cultured. Placement of PICC line for 10 more days of IV antibiotics. Continue IV vancomycin and cefepime per ID recommendations. Continue antipyretics. ID following and appreciate their recommendations.   #2 syncope Likely vasovagal in the setting of dehydration/orthostasis. Patient was noted to be orthostatic on 11/16/2012 and hydrate with IV fluids. Cardiac enzymes have been negative. EKG is reassuring. 2-D echo in the EF of 60-65% with no wall motion abnormalities from 08/31/2012. CT of the head was negative. No further episodes of syncope. NSL IVF. Follow.  #3 diarrhea Improved. Patient states had a solid bowel movement. C. difficile PCR has been negative. Imodium as needed.  #4 pancytopenia secondary to chemotherapy Patient received the last on 11/09/2012. Neutropenia, thrombocytopenia have improved. Anemia stable. Follow.  #5 night sweats  Questionable etiology. Follow. May be secondary to B symptoms versus occult infection. Cultures pending. Follow.  #6 B-cell lymphoma large abdominal mass and surrounding small lymph nodes status post CT-guided biopsy on 07/20/2012 suggestive of non-Hodgkin's lymphoma. Status post laparoscopic biopsy of left  abdominal mass 08/21/2011 with final pathology confirming a large B-cell lymphoma, CD20 positive with both follicular and diffuse patterns. Bone marrow biopsy was negative staging. Patient is status post cycle 1 CHOP/Rituxan on 09/06/2012, he completed cycle 4 11/09/2002 2. Restaging CT scans on 11/20/2012 confirming decreasing chest/abdominal lymphadenopathy and large mesenteric mass. Per oncology.  #7 nausea and vomiting Secondary to chemotherapy. Improved. Continue supportive care.  #8 status post Port-A-Cath placement 08/30/2012  Code Status: Stable. Family Communication: Updated patient and mother and father at bedside Disposition Plan: Home when medically stable   Consultants:  Hematology oncology: Dr. Gaylyn Rong 11/17/12  Infectious diseases: Dr. Orvan Falconer 11/21/2012  Procedures:  CT chest and abdomen 11/20/2012  CT of the head 11/15/2012  Chest x-ray of 11/15/2012  Lower extremity Dopplers 11/19/2012  Antibiotics:  IV vancomycin for 4/4/ 2004 to 11/18/2012  IV cefepime 11/15/2012 to  11/19/12  IV vancomycin 4/10/ 2014  IV cefepime 11/22/2012  HPI/Subjective: Patient with no fevers overnight. Patient endorsing chills overnight.  Objective: Filed Vitals:   11/25/12 0130 11/25/12 0300 11/25/12 0600 11/25/12 1332  BP:   146/79 147/68  Pulse:   72 78  Temp: 99.2 F (37.3 C) 98.6 F (37 C) 98.3 F (36.8 C) 98.8 F (37.1 C)  TempSrc: Oral Oral Oral Oral  Resp:   20 16  Height:      Weight:      SpO2:   95% 93%    Intake/Output Summary (Last 24 hours) at 11/25/12 1725 Last data filed at 11/25/12 0837  Gross per 24 hour  Intake    360 ml  Output      0 ml  Net    360 ml   Filed Weights   11/15/12 2113  Weight: 101.6 kg (223 lb 15.8 oz)    Exam:  General:  NAD  Cardiovascular: RRR  Respiratory: CTAB  Abdomen: Soft/NT/ND/+BS  Musculoskeletal: 5/5 bue STRENGTH, 5/5 ble STRENGTH  Extremities: No clubbing cyanosis or edema   Data Reviewed: Basic  Metabolic Panel:  Recent Labs Lab 11/21/12 0500 11/22/12 0605 11/23/12 0533 11/24/12 0555 11/25/12 0532  NA 137 139 136 139 138  K 4.1 4.0 3.9 3.8 3.9  CL 101 103 101 104 104  CO2 29 27 27 26 25   GLUCOSE 101* 99 98 110* 101*  BUN 9 12 11 13 13   CREATININE 0.87 0.84 0.88 0.87 0.98  CALCIUM 8.7 8.4 8.4 8.7 8.5   Liver Function Tests:  Recent Labs Lab 11/21/12 0500  AST 23  ALT 35  ALKPHOS 77  BILITOT 0.2*  PROT 5.6*  ALBUMIN 2.8*   No results found for this basename: LIPASE, AMYLASE,  in the last 168 hours No results found for this basename: AMMONIA,  in the last 168 hours CBC:  Recent Labs Lab 11/19/12 0625 11/20/12 0524 11/21/12 0500 11/22/12 0605 11/23/12 0533 11/24/12 0555 11/25/12 0532  WBC 9.4 9.7 9.2 8.1 7.9 8.9 7.2  NEUTROABS 7.8* 8.1* 7.5 6.7 6.7  --   --   HGB 10.0* 10.0* 10.2* 10.0* 9.8* 10.0* 10.0*  HCT 28.9* 29.1* 30.3* 30.3* 29.1* 29.7* 30.1*  MCV 86.3 86.1 86.3 86.8 86.6 85.8 86.5  PLT 128* 141* 161 204 235 262 268   Cardiac Enzymes: No results found for this basename: CKTOTAL, CKMB, CKMBINDEX, TROPONINI,  in the last 168 hours BNP (last 3 results) No results found for this basename: PROBNP,  in the last 8760 hours CBG: No results found for this basename: GLUCAP,  in the last 168 hours  Recent Results (from the past 240 hour(s))  CULTURE, BLOOD (ROUTINE X 2)     Status: None   Collection Time    11/15/12 10:25 PM      Result Value Range Status   Specimen Description BLOOD RIGHT HAND   Final   Special Requests BOTTLES DRAWN AEROBIC AND ANAEROBIC 3CC   Final   Culture  Setup Time 11/16/2012 01:09   Final   Culture NO GROWTH 5 DAYS   Final   Report Status 11/22/2012 FINAL   Final  CULTURE, BLOOD (ROUTINE X 2)     Status: None   Collection Time    11/15/12 10:30 PM      Result Value Range Status   Specimen Description BLOOD RIGHT ARM   Final   Special Requests BOTTLES DRAWN AEROBIC AND ANAEROBIC 5CC   Final   Culture  Setup Time  11/16/2012 02:20   Final   Culture NO GROWTH 5 DAYS   Final   Report Status 11/22/2012 FINAL   Final  CULTURE, BLOOD (ROUTINE X 2)     Status: None   Collection Time    11/16/12  6:55 PM      Result Value Range Status   Specimen Description BLOOD ARM   Final   Special Requests BOTTLES DRAWN AEROBIC AND ANAEROBIC 10CC   Final   Culture  Setup Time 11/17/2012 01:48   Final   Culture NO GROWTH 5 DAYS   Final   Report Status 11/23/2012 FINAL   Final  MRSA PCR SCREENING     Status: None   Collection Time    11/17/12  8:01 AM      Result Value Range Status   MRSA by PCR NEGATIVE  NEGATIVE Final   Comment:  The GeneXpert MRSA Assay (FDA     approved for NASAL specimens     only), is one component of a     comprehensive MRSA colonization     surveillance program. It is not     intended to diagnose MRSA     infection nor to guide or     monitor treatment for     MRSA infections.  CLOSTRIDIUM DIFFICILE BY PCR     Status: None   Collection Time    11/18/12  7:49 PM      Result Value Range Status   C difficile by pcr NEGATIVE  NEGATIVE Final  CULTURE, BLOOD (ROUTINE X 2)     Status: None   Collection Time    11/20/12  8:20 PM      Result Value Range Status   Specimen Description BLOOD RIGHT HAND   Final   Special Requests BOTTLES DRAWN AEROBIC AND ANAEROBIC 10CC   Final   Culture  Setup Time 11/21/2012 01:53   Final   Culture     Final   Value:        BLOOD CULTURE RECEIVED NO GROWTH TO DATE CULTURE WILL BE HELD FOR 5 DAYS BEFORE ISSUING A FINAL NEGATIVE REPORT   Report Status PENDING   Incomplete  CULTURE, BLOOD (ROUTINE X 2)     Status: None   Collection Time    11/20/12  8:24 PM      Result Value Range Status   Specimen Description BLOOD PORTA CATH   Final   Special Requests BOTTLES DRAWN AEROBIC AND ANAEROBIC 10CC   Final   Culture  Setup Time 11/21/2012 01:53   Final   Culture     Final   Value:        BLOOD CULTURE RECEIVED NO GROWTH TO DATE CULTURE WILL BE HELD  FOR 5 DAYS BEFORE ISSUING A FINAL NEGATIVE REPORT   Report Status PENDING   Incomplete  URINE CULTURE     Status: None   Collection Time    11/21/12 10:55 AM      Result Value Range Status   Specimen Description URINE, RANDOM   Final   Special Requests NONE   Final   Culture  Setup Time 11/21/2012 14:09   Final   Colony Count NO GROWTH   Final   Culture NO GROWTH   Final   Report Status 11/22/2012 FINAL   Final  CULTURE, BLOOD (ROUTINE X 2)     Status: None   Collection Time    11/22/12  8:30 AM      Result Value Range Status   Specimen Description BLOOD PORT   Final   Special Requests BOTTLES DRAWN AEROBIC AND ANAEROBIC 10CC   Final   Culture  Setup Time 11/22/2012 13:23   Final   Culture     Final   Value:        BLOOD CULTURE RECEIVED NO GROWTH TO DATE CULTURE WILL BE HELD FOR 5 DAYS BEFORE ISSUING A FINAL NEGATIVE REPORT   Report Status PENDING   Incomplete  CULTURE, BLOOD (ROUTINE X 2)     Status: None   Collection Time    11/22/12  9:46 AM      Result Value Range Status   Specimen Description BLOOD LEFT ARM   Final   Special Requests BOTTLES DRAWN AEROBIC AND ANAEROBIC 5CC   Final   Culture  Setup Time 11/22/2012 13:23   Final   Culture  Final   Value:        BLOOD CULTURE RECEIVED NO GROWTH TO DATE CULTURE WILL BE HELD FOR 5 DAYS BEFORE ISSUING A FINAL NEGATIVE REPORT   Report Status PENDING   Incomplete  MRSA PCR SCREENING     Status: None   Collection Time    11/22/12  7:57 PM      Result Value Range Status   MRSA by PCR NEGATIVE  NEGATIVE Final   Comment:            The GeneXpert MRSA Assay (FDA     approved for NASAL specimens     only), is one component of a     comprehensive MRSA colonization     surveillance program. It is not     intended to diagnose MRSA     infection nor to guide or     monitor treatment for     MRSA infections.     Studies: No results found.  Scheduled Meds: . aspirin  650 mg Oral Daily  . ceFEPime (MAXIPIME) IV  2 g  Intravenous Q8H  . enoxaparin (LOVENOX) injection  50 mg Subcutaneous Q24H  . sodium chloride  3 mL Intravenous Q12H  . sodium chloride  3 mL Intravenous Q12H  . vancomycin  1,500 mg Intravenous Q12H   Continuous Infusions:    Principal Problem:   Fever Active Problems:   Non-Hodgkin lymphoma of intra-abdominal lymph nodes   Syncope   Hyponatremia   Anemia   Nausea with vomiting   Diarrhea   Antineoplastic chemotherapy induced pancytopenia    Time spent: > 35 mins    [facility]  [facility] Pager [phone]. If 7PM-7AM, please contact night-coverage at [email or web address], password [facility] 11/25/2012, 5:25 PM  LOS: 10 days

## 2012-11-25 NOTE — Progress Notes (Signed)
IP PROGRESS NOTE  Subjective:   Chills noted overnight but no fever reported.  Clinically improved.  Objective: Vital signs in last 24 hours: Blood pressure 146/79, pulse 72, temperature 98.3 F (36.8 C), temperature source Oral, resp. rate 20, height 5' 10.08" (1.78 m), weight 223 lb 15.8 oz (101.6 kg), SpO2 95.00%.  Intake/Output from previous day: 04/12 0701 - 04/13 0700 In: 240 [P.O.:240] Out: -   Physical Exam:  HEENT: No thrush or ulcers, no sinus tenderness Lungs: Clear bilaterally Cardiac: Regular rate and rhythm Abdomen: Nontender, no hepatosplenomegaly, no mass Extremities: No leg edema Skin: No rash   Portacath-without erythema or tenderness   Lab Results:  Recent Labs  11/24/12 0555 11/25/12 0532  WBC 8.9 7.2  HGB 10.0* 10.0*  HCT 29.7* 30.1*  PLT 262 268   ANC 6.7  BMET  Recent Labs  11/24/12 0555 11/25/12 0532  NA 139 138  K 3.8 3.9  CL 104 104  CO2 26 25  GLUCOSE 110* 101*  BUN 13 13  CREATININE 0.87 0.98  CALCIUM 8.7 8.5   LDH 217 Studies/Results: No results found.  Medications: I have reviewed the patient's current medications.  Assessment/Plan:  1. Large abdominal mass and surrounding small lymph nodes status post CT-guided biopsy on 07/20/2012 with the pathology suggestive of non-Hodgkin's lymphoma. Status post laparoscopic biopsy of the left abdominal mass on 08/21/2011 with final pathology confirming a large B-cell lymphoma, CD20 positive, with both follicular and diffuse patterns. Negative staging bone marrow biopsy. Status post cycle 1 CHOP/Rituxan on 09/06/2012 , he completed cycle 4 on 11/08/2012. Restaging CT scans on 11/20/2012 confirmed a decrease in the chest/abdominal lymphadenopathy and large mesenteric mass 2. Nausea/vomiting on admission-likely secondary chemotherapy despite Aloxi/emend/Decadron, he continues to have episodes of nausea and vomiting when he has a fever. 3. Status post Port-A-Cath placement  08/30/2012. 4. Neutropenia following cycle 2 CHOP/Rituxan. Neulasta was added beginning with cycle 3. 5. Syncope event 11/15/2012-likely related to dehydration       6.   "Night sweats "-persistent.       7.    pancytopenia secondary to chemotherapy, he received Neulasta on 11/09/2012 , the neutropenia and thrombocytopenia have resolved, persistent anemia secondary to chemotherapy and probably phlebotomy       8.    Fever in the setting of neutropenia-no apparent source for infection, cultures remain negative, vancomycin/cefepime resumed on 11/22/2012. no source for infection on the CT scans 11/20/2012.       9.    diarrhea-C. difficile toxin negative on 11/18/2012  Patient clinically unchanged. Low grade temp resolved.    1. Continue antibiotics as is for now per ID.  2. Agree with port removal and PICC line insertion.         LOS: 10 days   Adam Alvarado  11/25/2012, 8:13 AM

## 2012-11-26 ENCOUNTER — Other Ambulatory Visit: Payer: Self-pay | Admitting: *Deleted

## 2012-11-26 ENCOUNTER — Inpatient Hospital Stay (HOSPITAL_COMMUNITY): Payer: BC Managed Care – PPO

## 2012-11-26 ENCOUNTER — Telehealth: Payer: Self-pay | Admitting: Oncology

## 2012-11-26 DIAGNOSIS — D649 Anemia, unspecified: Secondary | ICD-10-CM

## 2012-11-26 LAB — CBC WITH DIFFERENTIAL/PLATELET
HCT: 29.2 % — ABNORMAL LOW (ref 39.0–52.0)
Hemoglobin: 9.6 g/dL — ABNORMAL LOW (ref 13.0–17.0)
Lymphocytes Relative: 7 % — ABNORMAL LOW (ref 12–46)
Lymphs Abs: 0.6 10*3/uL — ABNORMAL LOW (ref 0.7–4.0)
MCHC: 32.9 g/dL (ref 30.0–36.0)
Monocytes Absolute: 1.2 10*3/uL — ABNORMAL HIGH (ref 0.1–1.0)
Monocytes Relative: 14 % — ABNORMAL HIGH (ref 3–12)
Neutro Abs: 6.5 10*3/uL (ref 1.7–7.7)
RBC: 3.37 MIL/uL — ABNORMAL LOW (ref 4.22–5.81)
WBC: 8.3 10*3/uL (ref 4.0–10.5)

## 2012-11-26 LAB — BASIC METABOLIC PANEL
BUN: 9 mg/dL (ref 6–23)
Chloride: 104 mEq/L (ref 96–112)
GFR calc Af Amer: >90 mL/min (ref >90)
Potassium: 3.8 mEq/L (ref 3.5–5.1)
Sodium: 137 mEq/L (ref 135–145)

## 2012-11-26 LAB — PROTIME-INR
INR: 1.15 (ref 0.00–1.49)
Prothrombin Time: 14.5 seconds (ref 11.6–15.2)

## 2012-11-26 MED ORDER — FENTANYL CITRATE 0.05 MG/ML IJ SOLN
INTRAMUSCULAR | Status: AC | PRN
  Administered 2012-11-26: 100 ug via INTRAVENOUS

## 2012-11-26 MED ORDER — MIDAZOLAM HCL 2 MG/2ML IJ SOLN
INTRAMUSCULAR | Status: AC | PRN
  Administered 2012-11-26: 2 mg via INTRAVENOUS

## 2012-11-26 NOTE — Progress Notes (Signed)
Patient ID: Adam Alvarado, male   DOB: 1955-10-06, 57 y.o.   MRN: [id number]         [facility]    Date of Admission:  11/15/2012           Day 5 vancomycin        Day 5 cefepime Principal Problem:   Fever Active Problems:   Syncope   Hyponatremia   Nausea with vomiting   Diarrhea   Non-Hodgkin lymphoma of intra-abdominal lymph nodes   Anemia   Antineoplastic chemotherapy induced pancytopenia   . aspirin  650 mg Oral Daily  . ceFEPime (MAXIPIME) IV  2 g Intravenous Q8H  . enoxaparin (LOVENOX) injection  50 mg Subcutaneous Q24H  . sodium chloride  3 mL Intravenous Q12H  . sodium chloride  3 mL Intravenous Q12H  . vancomycin  1,500 mg Intravenous Q12H    Subjective: He had one episode of nausea and vomiting last night associated with low-grade fever. He feels fine now. He has no abdominal pain.  Objective: Temp:  [98.8 F (37.1 C)-100.4 F (38 C)] 99.2 F (37.3 C) (04/14 0610) Pulse Rate:  [72-85] 72 (04/14 0610) Resp:  [16-17] 17 (04/14 0610) BP: (131-149)/(68-78) 131/78 mmHg (04/14 0610) SpO2:  [90 %-93 %] 90 % (04/14 0610)  General: He looks well. Port-A-Cath site appears normal Skin: No rash Lungs: Clear Cor: Regular S1-S2 no murmurs Abdomen: Soft and nontender  Lab Results Lab Results  Component Value Date   WBC 8.3 11/26/2012   HGB 9.6* 11/26/2012   HCT 29.2* 11/26/2012   MCV 86.6 11/26/2012   PLT 293 11/26/2012    Lab Results  Component Value Date   CREATININE 0.93 11/26/2012   BUN 9 11/26/2012   NA 137 11/26/2012   K 3.8 11/26/2012   CL 104 11/26/2012   CO2 25 11/26/2012    Lab Results  Component Value Date   ALT 35 11/21/2012   AST 23 11/21/2012   ALKPHOS 77 11/21/2012   BILITOT 0.2* 11/21/2012      Microbiology: Recent Results (from the past 240 hour(s))  CULTURE, BLOOD (ROUTINE X 2)     Status: None   Collection Time    11/16/12  6:55 PM      Result Value Range Status   Specimen Description BLOOD ARM   Final   Special  Requests BOTTLES DRAWN AEROBIC AND ANAEROBIC 10CC   Final   Culture  Setup Time 11/17/2012 01:48   Final   Culture NO GROWTH 5 DAYS   Final   Report Status 11/23/2012 FINAL   Final  MRSA PCR SCREENING     Status: None   Collection Time    11/17/12  8:01 AM      Result Value Range Status   MRSA by PCR NEGATIVE  NEGATIVE Final   Comment:            The GeneXpert MRSA Assay (FDA     approved for NASAL specimens     only), is one component of a     comprehensive MRSA colonization     surveillance program. It is not     intended to diagnose MRSA     infection nor to guide or     monitor treatment for     MRSA infections.  CLOSTRIDIUM DIFFICILE BY PCR     Status: None   Collection Time    11/18/12  7:49 PM      Result Value Range  Status   C difficile by pcr NEGATIVE  NEGATIVE Final  CULTURE, BLOOD (ROUTINE X 2)     Status: None   Collection Time    11/20/12  8:20 PM      Result Value Range Status   Specimen Description BLOOD RIGHT HAND   Final   Special Requests BOTTLES DRAWN AEROBIC AND ANAEROBIC 10CC   Final   Culture  Setup Time 11/21/2012 01:53   Final   Culture     Final   Value:        BLOOD CULTURE RECEIVED NO GROWTH TO DATE CULTURE WILL BE HELD FOR 5 DAYS BEFORE ISSUING A FINAL NEGATIVE REPORT   Report Status PENDING   Incomplete  CULTURE, BLOOD (ROUTINE X 2)     Status: None   Collection Time    11/20/12  8:24 PM      Result Value Range Status   Specimen Description BLOOD PORTA CATH   Final   Special Requests BOTTLES DRAWN AEROBIC AND ANAEROBIC 10CC   Final   Culture  Setup Time 11/21/2012 01:53   Final   Culture     Final   Value:        BLOOD CULTURE RECEIVED NO GROWTH TO DATE CULTURE WILL BE HELD FOR 5 DAYS BEFORE ISSUING A FINAL NEGATIVE REPORT   Report Status PENDING   Incomplete  URINE CULTURE     Status: None   Collection Time    11/21/12 10:55 AM      Result Value Range Status   Specimen Description URINE, RANDOM   Final   Special Requests NONE   Final    Culture  Setup Time 11/21/2012 14:09   Final   Colony Count NO GROWTH   Final   Culture NO GROWTH   Final   Report Status 11/22/2012 FINAL   Final  CULTURE, BLOOD (ROUTINE X 2)     Status: None   Collection Time    11/22/12  8:30 AM      Result Value Range Status   Specimen Description BLOOD PORT   Final   Special Requests BOTTLES DRAWN AEROBIC AND ANAEROBIC 10CC   Final   Culture  Setup Time 11/22/2012 13:23   Final   Culture     Final   Value:        BLOOD CULTURE RECEIVED NO GROWTH TO DATE CULTURE WILL BE HELD FOR 5 DAYS BEFORE ISSUING A FINAL NEGATIVE REPORT   Report Status PENDING   Incomplete  CULTURE, BLOOD (ROUTINE X 2)     Status: None   Collection Time    11/22/12  9:46 AM      Result Value Range Status   Specimen Description BLOOD LEFT ARM   Final   Special Requests BOTTLES DRAWN AEROBIC AND ANAEROBIC 5CC   Final   Culture  Setup Time 11/22/2012 13:23   Final   Culture     Final   Value:        BLOOD CULTURE RECEIVED NO GROWTH TO DATE CULTURE WILL BE HELD FOR 5 DAYS BEFORE ISSUING A FINAL NEGATIVE REPORT   Report Status PENDING   Incomplete  MRSA PCR SCREENING     Status: None   Collection Time    11/22/12  7:57 PM      Result Value Range Status   MRSA by PCR NEGATIVE  NEGATIVE Final   Comment:            The GeneXpert MRSA Assay (FDA  approved for NASAL specimens     only), is one component of a     comprehensive MRSA colonization     surveillance program. It is not     intended to diagnose MRSA     infection nor to guide or     monitor treatment for     MRSA infections.   Assessment: He still having low-grade fevers but overall has clearly improved on empiric antibiotics. I am hopeful that removal of the Port-A-Cath will lead to complete resolution of his fevers.  Plan: 1. Continue current antibiotics 2. Remove Port-A-Cath and place PICC  [name], MD [facility] [phone] pager   [phone]  cell 11/26/2012, 8:53 AM

## 2012-11-26 NOTE — ED Notes (Signed)
Port removal complete.

## 2012-11-26 NOTE — Progress Notes (Signed)
ANTIBIOTIC CONSULT NOTE - FOLLOW UP  Pharmacy Consult for Vancomcyin, Cefepime Indication: Fever of unknown etiology  No Known Allergies  Patient Measurements: Height: 5' 10.08" (178 cm) Weight: 223 lb 15.8 oz (101.6 kg) IBW/kg (Calculated) : 73.18  Vital Signs: Temp: 99.2 F (37.3 C) (04/14 0610) Temp src: Oral (04/14 0610) BP: 131/78 mmHg (04/14 0610) Pulse Rate: 72 (04/14 0610) Intake/Output from previous day: 04/13 0701 - 04/14 0700 In: 360 [P.O.:360] Out: -   Labs:  Recent Labs  11/24/12 0555 11/25/12 0532 11/26/12 0450  WBC 8.9 7.2 8.3  HGB 10.0* 10.0* 9.6*  PLT 262 268 293  CREATININE 0.87 0.98 0.93   Estimated Creatinine Clearance: 106.1 ml/min (by C-G formula based on Cr of 0.93).  Recent Labs  11/24/12 1946  VANCOTROUGH 9.9*     Microbiology: 4/3 Blood >> NGF 4/4 Blood >> NGF 4/5 MRSA PCR >> negative 4/6 C.diff PCR >> negative 4/8 Blood (PAC) >> NGtd 4/9 Urine >> NGF 4/10Blood >> NGTD   Anti-Infectives: 4/3 Vanc >> 4/6 4/3 Cefepime >> 4/7  4/10 Resume Vanc >>  4/10 Resume Cefepime >>  Assessment: 39 yom with h/o non-Hodgkin's lymphoma currently undergoing chemotherapy (last chemo 11/08/12, with Neulasta 11/09/12).  Pt was started on Vanc and cefepime  4/3-4/6 and was being observed off antibiotics but continued to spike fevers with chills and night sweats, therefore Vanc/Cefepime resumed 4/10.  Day # 5 Vanc and Cefepime (restart)  ID is following patient, and agree with current plan. Onc considering addition of antifungal if fevers persist; Planning to remove PAC and replace with PICC.  Tmax  99.2 this am, WBC normal (neutropenia resolved 4/4).  SCr stable, CrCl ~90 ml/min Normalized.  All cultures negative to date.  Goal of Therapy:  Vancomycin trough level 15-20 mcg/ml  Plan:   Continue Cefepime 2g IV q8h  Continue Vancomycin 1500mg  IV q12h.  Measure Vanc trough at steady state - 4/14 pm  Follow up renal fxn and culture  results.   Lynann Beaver PharmD, BCPS Pager [phone] 11/26/2012 8:50 AM

## 2012-11-26 NOTE — Progress Notes (Signed)
[facility] PROGRESS NOTE  Adam Alvarado ION:[id number] DOB: 27-Sep-1955 DOA: 11/15/2012 PCP: [name], MD  Assessment/Plan: #1 neutropenic fever/fever Patient initially was noted to have low-grade temperatures on admission however 3 days ago still spiking temperatures up to 102.5. Patient would low-grade temp of 100.4  overnight. Urinalysis is negative. Urine cultures have been negative. Blood cultures with no growth to date. CT of the abdomen and pelvis negative for any source of infection. Patient was on IV vancomycin and cefepime for approximately 3 days which was subsequently discontinued. Patient with ongoing fevers as of yesterday. Repeat blood cultures pending. Per ID patiient likely with possible porta cath infection. Porta cath to be removed and tip cultured today per IR. Placement of PICC line per IR  for 9 more days of IV antibiotics. Continue IV vancomycin and cefepime per ID recommendations. Continue antipyretics. ID following and appreciate their recommendations.   #2 syncope Likely vasovagal in the setting of dehydration/orthostasis. Patient was noted to be orthostatic on 11/16/2012 and hydrate with IV fluids. Cardiac enzymes have been negative. EKG is reassuring. 2-D echo in the EF of 60-65% with no wall motion abnormalities from 08/31/2012. CT of the head was negative. No further episodes of syncope. NSL IVF. Follow.  #3 diarrhea Resolved. C. difficile PCR has been negative. Imodium as needed.  #4 pancytopenia secondary to chemotherapy Patient received the last on 11/09/2012. Neutropenia, thrombocytopenia have improved. Anemia stable. Follow.  #5 night sweats  Questionable etiology. Follow. May be secondary to B symptoms versus occult infection. Cultures pending. Follow.  #6 B-cell lymphoma large abdominal mass and surrounding small lymph nodes status post CT-guided biopsy on 07/20/2012 suggestive of non-Hodgkin's lymphoma. Status post laparoscopic biopsy of left  abdominal mass 08/21/2011 with final pathology confirming a large B-cell lymphoma, CD20 positive with both follicular and diffuse patterns. Bone marrow biopsy was negative staging. Patient is status post cycle 1 CHOP/Rituxan on 09/06/2012, he completed cycle 4 11/09/2002 2. Restaging CT scans on 11/20/2012 confirming decreasing chest/abdominal lymphadenopathy and large mesenteric mass. Per oncology.  #7 nausea and vomiting Secondary to chemotherapy. Improved. Continue supportive care.  #8 status post Port-A-Cath placement 08/30/2012 Port-A-Cath to be removed today per interventional radiology replaced by PICC line due to concerns for Port-A-Cath infection.  Code Status: Stable. Family Communication: Updated patient and wife at bedside Disposition Plan: Home when medically stable   Consultants:  Hematology oncology: Dr. Gaylyn Rong 11/17/12  Infectious diseases: Dr. Orvan Falconer 11/21/2012  Procedures:  CT chest and abdomen 11/20/2012  CT of the head 11/15/2012  Chest x-ray of 11/15/2012  Lower extremity Dopplers 11/19/2012  Port-A-Cath removal for 4/14/ 2014  PICC placement. Original radiology 11/26/2012  Antibiotics:  IV vancomycin for 4/4/ 2004 to 11/18/2012  IV cefepime 11/15/2012 to  11/19/12  IV vancomycin 4/10/ 2014  IV cefepime 11/22/2012  HPI/Subjective: Patient with fever of 100.4 overnight, nausea and emesis. Patient states he feels well today.  Objective: Filed Vitals:   11/25/12 2122 11/25/12 2200 11/26/12 0204 11/26/12 0610  BP: 149/77   131/78  Pulse: 85   72  Temp: 100.4 F (38 C) 99.5 F (37.5 C) 98.9 F (37.2 C) 99.2 F (37.3 C)  TempSrc: Oral  Oral Oral  Resp: 16   17  Height:      Weight:      SpO2: 90%   90%   No intake or output data in the 24 hours ending 11/26/12 0935 Filed Weights   11/15/12 2113  Weight: 101.6 kg (223 lb 15.8 oz)  Exam:   General:  NAD  Cardiovascular: RRR  Respiratory: CTAB  Abdomen:  Soft/NT/ND/+BS  Musculoskeletal: 5/5 bue STRENGTH, 5/5 ble STRENGTH  Extremities: No clubbing cyanosis or edema   Data Reviewed: Basic Metabolic Panel:  Recent Labs Lab 11/22/12 0605 11/23/12 0533 11/24/12 0555 11/25/12 0532 11/26/12 0450  NA 139 136 139 138 137  K 4.0 3.9 3.8 3.9 3.8  CL 103 101 104 104 104  CO2 27 27 26 25 25   GLUCOSE 99 98 110* 101* 99  BUN 12 11 13 13 9   CREATININE 0.84 0.88 0.87 0.98 0.93  CALCIUM 8.4 8.4 8.7 8.5 8.3*   Liver Function Tests:  Recent Labs Lab 11/21/12 0500  AST 23  ALT 35  ALKPHOS 77  BILITOT 0.2*  PROT 5.6*  ALBUMIN 2.8*   No results found for this basename: LIPASE, AMYLASE,  in the last 168 hours No results found for this basename: AMMONIA,  in the last 168 hours CBC:  Recent Labs Lab 11/20/12 0524 11/21/12 0500 11/22/12 0605 11/23/12 0533 11/24/12 0555 11/25/12 0532 11/26/12 0450  WBC 9.7 9.2 8.1 7.9 8.9 7.2 8.3  NEUTROABS 8.1* 7.5 6.7 6.7  --   --  6.5  HGB 10.0* 10.2* 10.0* 9.8* 10.0* 10.0* 9.6*  HCT 29.1* 30.3* 30.3* 29.1* 29.7* 30.1* 29.2*  MCV 86.1 86.3 86.8 86.6 85.8 86.5 86.6  PLT 141* 161 204 235 262 268 293   Cardiac Enzymes: No results found for this basename: CKTOTAL, CKMB, CKMBINDEX, TROPONINI,  in the last 168 hours BNP (last 3 results) No results found for this basename: PROBNP,  in the last 8760 hours CBG: No results found for this basename: GLUCAP,  in the last 168 hours  Recent Results (from the past 240 hour(s))  CULTURE, BLOOD (ROUTINE X 2)     Status: None   Collection Time    11/16/12  6:55 PM      Result Value Range Status   Specimen Description BLOOD ARM   Final   Special Requests BOTTLES DRAWN AEROBIC AND ANAEROBIC 10CC   Final   Culture  Setup Time 11/17/2012 01:48   Final   Culture NO GROWTH 5 DAYS   Final   Report Status 11/23/2012 FINAL   Final  MRSA PCR SCREENING     Status: None   Collection Time    11/17/12  8:01 AM      Result Value Range Status   MRSA by PCR NEGATIVE   NEGATIVE Final   Comment:            The GeneXpert MRSA Assay (FDA     approved for NASAL specimens     only), is one component of a     comprehensive MRSA colonization     surveillance program. It is not     intended to diagnose MRSA     infection nor to guide or     monitor treatment for     MRSA infections.  CLOSTRIDIUM DIFFICILE BY PCR     Status: None   Collection Time    11/18/12  7:49 PM      Result Value Range Status   C difficile by pcr NEGATIVE  NEGATIVE Final  CULTURE, BLOOD (ROUTINE X 2)     Status: None   Collection Time    11/20/12  8:20 PM      Result Value Range Status   Specimen Description BLOOD RIGHT HAND   Final   Special Requests BOTTLES DRAWN AEROBIC AND  ANAEROBIC 10CC   Final   Culture  Setup Time 11/21/2012 01:53   Final   Culture     Final   Value:        BLOOD CULTURE RECEIVED NO GROWTH TO DATE CULTURE WILL BE HELD FOR 5 DAYS BEFORE ISSUING A FINAL NEGATIVE REPORT   Report Status PENDING   Incomplete  CULTURE, BLOOD (ROUTINE X 2)     Status: None   Collection Time    11/20/12  8:24 PM      Result Value Range Status   Specimen Description BLOOD PORTA CATH   Final   Special Requests BOTTLES DRAWN AEROBIC AND ANAEROBIC 10CC   Final   Culture  Setup Time 11/21/2012 01:53   Final   Culture     Final   Value:        BLOOD CULTURE RECEIVED NO GROWTH TO DATE CULTURE WILL BE HELD FOR 5 DAYS BEFORE ISSUING A FINAL NEGATIVE REPORT   Report Status PENDING   Incomplete  URINE CULTURE     Status: None   Collection Time    11/21/12 10:55 AM      Result Value Range Status   Specimen Description URINE, RANDOM   Final   Special Requests NONE   Final   Culture  Setup Time 11/21/2012 14:09   Final   Colony Count NO GROWTH   Final   Culture NO GROWTH   Final   Report Status 11/22/2012 FINAL   Final  CULTURE, BLOOD (ROUTINE X 2)     Status: None   Collection Time    11/22/12  8:30 AM      Result Value Range Status   Specimen Description BLOOD PORT   Final    Special Requests BOTTLES DRAWN AEROBIC AND ANAEROBIC 10CC   Final   Culture  Setup Time 11/22/2012 13:23   Final   Culture     Final   Value:        BLOOD CULTURE RECEIVED NO GROWTH TO DATE CULTURE WILL BE HELD FOR 5 DAYS BEFORE ISSUING A FINAL NEGATIVE REPORT   Report Status PENDING   Incomplete  CULTURE, BLOOD (ROUTINE X 2)     Status: None   Collection Time    11/22/12  9:46 AM      Result Value Range Status   Specimen Description BLOOD LEFT ARM   Final   Special Requests BOTTLES DRAWN AEROBIC AND ANAEROBIC 5CC   Final   Culture  Setup Time 11/22/2012 13:23   Final   Culture     Final   Value:        BLOOD CULTURE RECEIVED NO GROWTH TO DATE CULTURE WILL BE HELD FOR 5 DAYS BEFORE ISSUING A FINAL NEGATIVE REPORT   Report Status PENDING   Incomplete  MRSA PCR SCREENING     Status: None   Collection Time    11/22/12  7:57 PM      Result Value Range Status   MRSA by PCR NEGATIVE  NEGATIVE Final   Comment:            The GeneXpert MRSA Assay (FDA     approved for NASAL specimens     only), is one component of a     comprehensive MRSA colonization     surveillance program. It is not     intended to diagnose MRSA     infection nor to guide or     monitor treatment for  MRSA infections.     Studies: No results found.  Scheduled Meds: . aspirin  650 mg Oral Daily  . ceFEPime (MAXIPIME) IV  2 g Intravenous Q8H  . enoxaparin (LOVENOX) injection  50 mg Subcutaneous Q24H  . sodium chloride  3 mL Intravenous Q12H  . sodium chloride  3 mL Intravenous Q12H  . vancomycin  1,500 mg Intravenous Q12H   Continuous Infusions:    Principal Problem:   Fever Active Problems:   Non-Hodgkin lymphoma of intra-abdominal lymph nodes   Syncope   Hyponatremia   Anemia   Nausea with vomiting   Diarrhea   Antineoplastic chemotherapy induced pancytopenia    Time spent: > 35 mins    [facility]  [facility] Pager [phone]. If 7PM-7AM, please contact night-coverage at  [email or web address], password [facility] 11/26/2012, 9:35 AM  LOS: 11 days

## 2012-11-26 NOTE — Progress Notes (Signed)
PHARMACY BRIEF NOTE - Drug Level Result  Consult for:  Vancomycin Indication:  Fever of unknown etiology  The current Vancomycin dose is 1,500 mg IV every 12 hours.  The trough level drawn prior to the 9 pm dose tonight is reported as 15.3 mcg/ml.  This dose is within the therapeutic range, 15-20 mcg/ml.  Plan: Continue Vancomycin at the current dose.  Polo Riley R.Ph. 11/26/2012 10:50 PM

## 2012-11-26 NOTE — Procedures (Signed)
Interventional Radiology Procedure Note  Procedure: 1.) Removal of right IJ portacatheter.  Tip sent for culture 2.) Placement new right basilic vein approach dual lumen PowerPICC.  Tip at superior CAJ and ready for use.  Complications: None Recommendations: - Routine line care for new PICC - Keep bandage on port site x 24 hrs   Signed,  Sterling Big, MD Vascular & Interventional Radiologist [facility] Radiology

## 2012-11-26 NOTE — Telephone Encounter (Signed)
Per 4/14 pof added lbf/u 4/23. tx 4/28 and inj 4/29. S/w pt wife she is aware and also aware that remaining appts will be adjusted when pt sees [facility] 4/23. Also wife had scheduled pet for 4/25 and will r/s for before 4/23.

## 2012-11-27 ENCOUNTER — Encounter (HOSPITAL_COMMUNITY): Payer: BC Managed Care – PPO

## 2012-11-27 DIAGNOSIS — R112 Nausea with vomiting, unspecified: Secondary | ICD-10-CM

## 2012-11-27 DIAGNOSIS — R197 Diarrhea, unspecified: Secondary | ICD-10-CM

## 2012-11-27 LAB — CULTURE, BLOOD (ROUTINE X 2)

## 2012-11-27 LAB — BASIC METABOLIC PANEL
CO2: 27 mEq/L (ref 19–32)
GFR calc non Af Amer: >90 mL/min (ref >90)
Glucose, Bld: 98 mg/dL (ref 70–99)
Potassium: 4 mEq/L (ref 3.5–5.1)
Sodium: 139 mEq/L (ref 135–145)

## 2012-11-27 LAB — CBC WITH DIFFERENTIAL/PLATELET
Basophils Absolute: 0.1 10*3/uL (ref 0.0–0.1)
Eosinophils Absolute: 0 10*3/uL (ref 0.0–0.7)
Lymphocytes Relative: 8 % — ABNORMAL LOW (ref 12–46)
Lymphs Abs: 0.6 10*3/uL — ABNORMAL LOW (ref 0.7–4.0)
Neutrophils Relative %: 76 % (ref 43–77)
Platelets: 297 10*3/uL (ref 150–400)
RBC: 3.21 MIL/uL — ABNORMAL LOW (ref 4.22–5.81)
RDW: 14.8 % (ref 11.5–15.5)
WBC: 7.9 10*3/uL (ref 4.0–10.5)

## 2012-11-27 MED ORDER — ASPIRIN 325 MG PO TABS: 650.0000 mg | ORAL_TABLET | Freq: Four times a day (QID) | ORAL | Status: DC | PRN

## 2012-11-27 MED ORDER — HEPARIN SOD (PORK) LOCK FLUSH 100 UNIT/ML IV SOLN
250.0000 [IU] | INTRAVENOUS | Status: AC | PRN
  Administered 2012-11-27: 250 [IU]

## 2012-11-27 MED ORDER — DEXTROSE 5 % IV SOLN: 2.0000 g | Freq: Three times a day (TID) | INTRAVENOUS | Status: AC

## 2012-11-27 MED ORDER — VANCOMYCIN HCL 10 G IV SOLR: 1500.0000 mg | Freq: Two times a day (BID) | INTRAVENOUS | Status: AC

## 2012-11-27 NOTE — Progress Notes (Signed)
ANTIBIOTIC CONSULT NOTE - FOLLOW UP  Pharmacy Consult for Vancomcyin, Cefepime Indication: Fever of unknown etiology  No Known Allergies  Patient Measurements: Height: 5' 10.08" (178 cm) Weight: 223 lb 15.8 oz (101.6 kg) IBW/kg (Calculated) : 73.18  Vital Signs: Temp: 98.9 F (37.2 C) (04/15 1211) Temp src: Oral (04/15 1211) BP: 144/71 mmHg (04/15 0607) Pulse Rate: 70 (04/15 0607)  Labs:  Recent Labs  11/25/12 0532 11/26/12 0450 11/27/12 0530  WBC 7.2 8.3 7.9  HGB 10.0* 9.6* 9.5*  PLT 268 293 297  CREATININE 0.98 0.93 0.89   Estimated Creatinine Clearance: 110.9 ml/min (by C-G formula based on Cr of 0.89).  Recent Labs  11/24/12 1946 11/26/12 2050  VANCOTROUGH 9.9* 15.3     Microbiology: 4/3 Blood >> NGF 4/4 Blood >> NGF 4/5 MRSA PCR >> negative 4/6 C.diff PCR >> negative 4/8 Blood (PAC) >> NGtd 4/9 Urine >> NGF 4/10Blood >> NGTD   Anti-Infectives: 4/3 Vanc >> 4/6 4/3 Cefepime >> 4/7  4/10 Resume Vanc >>  4/10 Resume Cefepime >>  Assessment: 59 yom with h/o non-Hodgkin's lymphoma currently undergoing chemotherapy (last chemo 11/08/12, with Neulasta 11/09/12).  Pt was started on Vanc and cefepime  4/3-4/6 and was being observed off antibiotics but continued to spike fevers with chills and night sweats, therefore Vanc/Cefepime resumed 4/10.  Day # 6 Vanc and Cefepime (restart).  Per ID notes, planning to treat for 14 days.  PAC removed 4/14 and replaced with PICC  WBC normal (neutropenia resolved 4/4).  SCr stable, CrCl ~90 ml/min Normalized.  All cultures negative to date.  Vancomycin trough level (15.3) is therapeutic.    Goal of Therapy:  Vancomycin trough level 15-20 mcg/ml  Plan:   Continue Cefepime 2g IV q8h  Continue Vancomycin 1500mg  IV q12h.  Pharmacy to follow renal function and cultures as available.   If discharged with home IV antibiotics, recommend continuing Cefepime 2g IV q8h and Vancomycin 1500mg  IV q12h unless renal  function changes significantly.  Serum creatinine and Vancomycin trough levels at least weekly to assess therapy.   Lynann Beaver PharmD, BCPS Pager [phone] 11/27/2012 1:07 PM

## 2012-11-27 NOTE — Progress Notes (Signed)
Patient ID: Adam Alvarado, male   DOB: 1955-10-07, 57 y.o.   MRN: [id number]         [facility] for Infectious Disease    Date of Admission:  11/15/2012   Total days of antibiotics 6         Principal Problem:   Fever Active Problems:   Syncope   Hyponatremia   Nausea with vomiting   Diarrhea   Non-Hodgkin lymphoma of intra-abdominal lymph nodes   Anemia   Antineoplastic chemotherapy induced pancytopenia   . aspirin  650 mg Oral Daily  . ceFEPime (MAXIPIME) IV  2 g Intravenous Q8H  . enoxaparin (LOVENOX) injection  50 mg Subcutaneous Q24H  . sodium chloride  3 mL Intravenous Q12H  . sodium chloride  3 mL Intravenous Q12H  . vancomycin  1,500 mg Intravenous Q12H    Subjective: He had some low-grade fever this morning but overall is feeling better since he restarted antibiotics 6 days ago  Objective: Temp:  [97.9 F (36.6 C)-99.6 F (37.6 C)] 98.9 F (37.2 C) (04/15 1211) Pulse Rate:  [67-79] 70 (04/15 0607) Resp:  [17-18] 18 (04/15 0607) BP: (144-156)/(71-80) 144/71 mmHg (04/15 0607) SpO2:  [86 %-100 %] 100 % (04/15 0607)  General: He appears well and ready to be discharged Skin: His right upper chest Port-A-Cath was removed yesterday. He is a new right arm PICC Lungs: Clear Cor: Regular S1 and S2 no murmurs Abdomen: Nontender  Lab Results Lab Results  Component Value Date   WBC 7.9 11/27/2012   HGB 9.5* 11/27/2012   HCT 27.3* 11/27/2012   MCV 85.0 11/27/2012   PLT 297 11/27/2012    All blood cultures remain negative Port-A-Cath tip cultures are negative at 24 hours  Assessment: I am hopeful that his fevers will resolve completely with 8 more days of antibiotics now that his Port-A-Cath has been removed.  Plan: 1. Continue IV vancomycin and cefepime through April 23 2. Followup with me in clinic on April 24 3. I reviewed all outpatient antibiotic orders with [facility]  [name], MD [facility] for Infectious Disease [facility]  [facility] [phone] pager   [phone] cell 11/27/2012, 2:26 PM

## 2012-11-27 NOTE — Progress Notes (Signed)
Pt has been set up with [facility] for [facility] services for IV antibiotic administration.

## 2012-11-27 NOTE — Discharge Summary (Signed)
Physician Discharge Summary  Adam Alvarado NWG:[id number] DOB: 1955-11-28 DOA: 11/15/2012  PCP: [name], MD  Admit date: 11/15/2012 Discharge date: 11/27/2012  Time spent: 70 minutes  Recommendations for Outpatient Follow-up:  1. Patient is to followup with Dr. Orvan Falconer of infectious disease on 12/06/2012 2. Patient is to followup with Dr. [name] of hematology oncology on 12/05/2012.  Discharge Diagnoses:  Principal Problem:   Fever Active Problems:   Non-Hodgkin lymphoma of intra-abdominal lymph nodes   Syncope   Hyponatremia   Anemia   Nausea with vomiting   Diarrhea   Antineoplastic chemotherapy induced pancytopenia   Discharge Condition: Stable and improved  Diet recommendation: Regular  Filed Weights   11/15/12 2113  Weight: 101.6 kg (223 lb 15.8 oz)    History of present illness:  Adam Alvarado is a 57 y.o. male, with past medical history significant for lymphoma diagnosed last Thanksgiving presenting today with a few days history of" feeling bad". Patient had a nonproductive cough around 3 episodes of nausea and vomiting a day since his last chemotherapy last Thursday. He reports chills and night sweats for the last few days and he feels weak with his head swimming. Today he went to visit his neighbor and collapsed for around 2 minutes after an episode of nausea and vomiting. Reports feverishness as well. In the emergency room he was noted to have low-grade fever, his chest x-ray was negative, Dr. Juliette Alcide was consulted by the emergency room doctor and advised treating him as if febrile neutropenia.   Hospital Course:  1 neutropenic fever/fever  Patient initially was noted to have low-grade temperatures on admission. Patient was initially started on empiric IV vancomycin and cefepime. Blood cultures were negative x2. Urinalysis which was done was also negative. CT of the abdomen and pelvis negative for any source of infection. is C. difficile PCR was obtained as  patient had presented with diarrhea which was negative.   After 3 days of IV antibiotics after cultures were still negative IV antibiotics were discontinued. Patient was monitored off antibiotics however continued to spike fevers. An ID consultation was obtained and patient was followed. Repeat blood cultures were done with no growth to date. Patient was subsequently restarted on IV vancomycin and cefepime with improvement in his fever curve and clinical improvement. It was felt that as patient was improving back on IV antibiotics though is the possibility that the Port-A-Cath may be infected. Interventional radiology was consulted and Port-A-Cath was removed and the PICC line placed. It was recommended that I D. that patient continue on empiric IV antibiotics for 8 more days to complete a two-week course of antibiotic therapy. Patient will followup with her Dr. Orvan Falconer of ID post antibiotic therapy. Patient will also followup with his oncologist as outpatient. Patient will be discharged in stable and improved condition. Patient was on IV vancomycin and cefepime for approximately 3 days which was subsequently discontinued. #2 syncope  Likely vasovagal in the setting of dehydration/orthostasis. Patient was noted to be orthostatic on 11/16/2012 and hydrated with IV fluids. Cardiac enzymes have been negative. EKG is reassuring. 2-D echo with EF of 60-65% with no wall motion abnormalities from 08/31/2012. CT of the head was negative. No further episodes of syncope.  patient's IV fluids were subsequently discontinued patient remained in stable condition did not have any further episodes of syncope. NSL IVF. #3 diarrhea   on admission patient presented with diarrhea. Stool sent for C. difficile PCR which was negative. Patient's diarrhea improved and resolved during  the hospitalization. #4 pancytopenia secondary to chemotherapy  Patient received neulasta on 11/09/2012.  On admission patient was noted to be  neutropenic with thrombocytopenia which improved. Patient was seen by oncology and followed by oncology during the hospitalization.  #5 night sweats  Questionable etiology. Follow. May be secondary to B symptoms versus occult infection. patient was pan cultured would repeat blood cultures done which were negative. Urinalysis which was done was also negative. Urine cultures were negative. Pelvic cath was subsequently removed and culture the results were pending at the time of discharge. A PICC line was placed. Patient was treated with IV vancomycin and cefepime during the hospitalization be discharged home on 8 more days of IV antibiotics.  #6 B-cell lymphoma  large abdominal mass and surrounding small lymph nodes status post CT-guided biopsy on 07/20/2012 suggestive of non-Hodgkin's lymphoma. Status post laparoscopic biopsy of left abdominal mass 08/21/2011 with final pathology confirming a large B-cell lymphoma, CD20 positive with both follicular and diffuse patterns. Bone marrow biopsy was negative staging. Patient is status post cycle 1 CHOP/Rituxan on 09/06/2012, he completed cycle 4 11/09/2002 2. Restaging CT scans on 11/20/2012 confirming decreasing chest/abdominal lymphadenopathy and large mesenteric mass.  Patient was seen and followed by oncology throughout the hospitalization. #7 nausea and vomiting  Secondary to chemotherapy. Improved on supportive care.  #8 status post Port-A-Cath placement 08/30/2012  Port-A-Cath was removed and the tip cultured per interventional radiology on 11/26/2012 and  replaced by PICC line due to concerns for Port-A-Cath infection.      Procedures: CT chest and abdomen 11/20/2012  CT of the head 11/15/2012  Chest x-ray of 11/15/2012  Lower extremity Dopplers 11/19/2012  Port-A-Cath removal for 4/14/ 2014  PICC placement. Original radiology 11/26/2012   Consultations: Hematology oncology: Dr. Gaylyn Rong 11/17/12  Infectious diseases: Dr. Orvan Falconer  11/21/2012   Discharge Exam: Filed Vitals:   11/26/12 2220 11/26/12 2223 11/27/12 0607 11/27/12 1211  BP:  147/74 144/71   Pulse:  79 70   Temp:  99.6 F (37.6 C) 98.3 F (36.8 C) 98.9 F (37.2 C)  TempSrc:  Oral Oral Oral  Resp:  18 18   Height:      Weight:      SpO2: 86% 92% 100%     General: NAD Cardiovascular: RRR Respiratory: CTAB  Discharge Instructions  Discharge Orders   Future Appointments Provider Department Dept Phone   12/05/2012 9:00 AM Dava Najjar Id[facility] [phone]   12/05/2012 9:30 AM Ladene Artist, MD [facility] MEDICAL ONCOLOGY [phone]   12/06/2012 11:00 AM [name], MD [facility] for Infect[facility] Disease [phone]   12/10/2012 9:30 AM [facility] [place] [facility] [phone]   12/11/2012 9:00 AM [facility] Inj Nurse [place] [facility] [phone]   12/20/2012 9:00 AM Windell Hum[facility]bird [facility] MEDICAL ONCOLOGY [phone]   12/20/2012 9:30 AM Ladene Artist, MD [facility] MEDICAL ONCOLOGY [phone]   12/20/2012 10:30 AM [facility] [place] [facility] [phone]   12/21/2012 9:45 AM [facility] Inj Nurse [place] [facility] [phone]   Future Orders Complete By Expires     Diet general  As directed     Discharge instructions  As directed     Comments:      Follow up with Dr Truett Perna as scheduled on 12/05/12 Follow up with Dr Orvan Falconer on 12/06/12    Increase activity  slowly  As directed         Medication List    TAKE these medications       aspirin 325 MG tablet  Take 2 tablets (650 mg total) by mouth every 6 (six) hours as needed for pain or fever.     baclofen 10 MG tablet  Commonly known as:  LIORESAL  Take 0.5 tablets (5 mg total) by mouth 3 (three) times daily as needed (FOR HICCUPS).     dextrose 5 % SOLN 50 mL with  ceFEPIme 2 G SOLR 2 g  Inject 2 g into the vein every 8 (eight) hours.     lidocaine-prilocaine cream  Commonly known as:  EMLA  Apply topically as needed. Apply to [facility] 2 hours prior to stick and cover with plastic wrap to numb site     LORazepam 1 MG tablet  Commonly known as:  ATIVAN  Place 1 tablet (1 mg total) under the tongue every 6 (six) hours as needed (nausea).     predniSONE 20 MG tablet  Commonly known as:  DELTASONE  Take 5 tablets (100 mg total) by mouth daily. X 5 days  Begin on day 1 of each chemo cycle     prochlorperazine 10 MG tablet  Commonly known as:  COMPAZINE  Take 1 tablet (10 mg total) by mouth every 6 (six) hours as needed (nausea).     sodium chloride 0.9 % SOLN 500 mL with vancomycin 10 G SOLR 1,500 mg  Inject 1,500 mg into the vein every 12 (twelve) hours.           Follow-up Information   Follow up with [name], MD On 12/05/2012. (f/u as scheduled)    Contact information:   [address] [place] [address] [phone]       Follow up with [name], MD On 12/06/2012. (f/u as scheduled)    Contact information:   [address] [place] [address] [phone]        The results of significant diagnostics from this hospitalization (including imaging, microbiology, ancillary and laboratory) are listed below for reference.    Significant Diagnostic Studies: Dg Chest 2 View  11/15/2012  *RADIOLOGY REPORT*  Clinical Data: Loss of consciousness.  Fever.  Lymphoma.  CHEST - 2 VIEW  Comparison: 12/01/2004  Findings: Lungs are clear.  Negative for pneumonia or effusion. Vascularity normal.  Port-A-Cath tip in the SVC. Negative for adenopathy or mass.  IMPRESSION: No active cardiopulmonary abnormality.  Negative for pneumonia.   Original Report Authenticated By: Janeece Riggers, M.D.    Ct Head Wo Contrast  11/15/2012  *RADIOLOGY REPORT*  Clinical Data: History of lymphoma.  Syncope.  CT HEAD WITHOUT CONTRAST   Technique:  Contiguous axial images were obtained from the base of the skull through the vertex without contrast.  Comparison: None.  Findings: No mass effect, midline shift, or acute intracranial hemorrhage.  Dilated perivascular space in the lower left basal ganglia.  Brain parenchyma and ventricles system are within normal limits.  Mastoid air cells are clear.  Visualized paranasal sinuses are clear.  IMPRESSION: No acute intracranial pathology.   Original Report Authenticated By: Jolaine Click, M.D.    Ct Chest W Contrast  11/20/2012  *RADIOLOGY REPORT*  Clinical Data:  Non-Hodgkins lymphoma.  Low grade fever.  Left upper quadrant abdominal mass.  CT CHEST, ABDOMEN AND PELVIS WITH CONTRAST  Technique:  Multidetector CT imaging of the chest, abdomen and pelvis was performed  following the standard protocol during bolus administration of intravenous contrast.  Contrast: OMNIPAQUE IOHEXOL 300 MG/ML  SOLN  Comparison:  Multiple exams, including 09/05/2012  CT CHEST  Findings:  Prior prevascular mass revealed by fat planes to be to separate lymph nodes, improved in size, with the more medial lesion previously having a short axis diameter of 1.9 cm, currently 0.9 cm; more lateral noted previously having a short axis of 1.5 cm, currently 1.1 cm.  No other adenopathy in the chest.  Small hiatal hernia.  No significant vascular abnormality observed. Right Port-A-Cath tip:  Cavoatrial junction.  Ground-glass nodule, left upper lobe, 0.5 by 0.4 cm, not well shown on prior PET CT if present at all.  0.4 cm nodule in the left lower lobe, image 30 of series 4.  Mild subsegmental atelectasis in the posterior basal segments of both lower lobes.  Very faint mosaic attenuation in the upper lobes, query air trapping.  IMPRESSION:  1.  Reduced size of the two adjacent prevascular lymph nodes. 2.  Small hiatal hernia. 3.  4 mm ground-glass nodule in the left upper lobe; 4 mm potentially solid nodule in the left lower lobe. If  the patient is at high risk for bronchogenic carcinoma, follow-up chest CT at 1 year is recommended.  If the patient is at low risk, no follow-up is needed.  This recommendation follows the consensus statement: Guidelines for Management of Small Pulmonary Nodules Detected on CT Scans:  A Statement from the Fleischner Society as published in Radiology 2005; 237:395-400. 4.  Faint to mosaic attenuation in the upper lobes, query air trapping.  CT ABDOMEN AND PELVIS  Findings:  The liver, spleen, pancreas, and adrenal glands appear unremarkable.  The gallbladder and biliary system appear unremarkable.  The kidneys appear unremarkable, as do the proximal ureters.  Left upper quadrant mesenteric mass partially involves the superior mesenteric artery and wraps around the left side of the superior mesenteric vein.  Mass currently measures 5.4 x 6.1 cm (formerly 11.4 x 11.2 cm) and demonstrates reduced density.  Appendix normal. No pathologic pelvic adenopathy is identified.  Urinary bladder unremarkable.  No dilated bowel. Left periaortic node short axis diameter 0.4 cm (formerly 1.1 cm).  IMPRESSION:  1.  Considerable reduction in size of the left upper quadrant mesenteric mass and in the retroperitoneal lymph node compared to prior.   Original Report Authenticated By: Gaylyn Rong, M.D.    Ct Abdomen Pelvis W Contrast  11/20/2012  *RADIOLOGY REPORT*  Clinical Data:  Non-Hodgkins lymphoma.  Low grade fever.  Left upper quadrant abdominal mass.  CT CHEST, ABDOMEN AND PELVIS WITH CONTRAST  Technique:  Multidetector CT imaging of the chest, abdomen and pelvis was performed following the standard protocol during bolus administration of intravenous contrast.  Contrast: OMNIPAQUE IOHEXOL 300 MG/ML  SOLN  Comparison:  Multiple exams, including 09/05/2012  CT CHEST  Findings:  Prior prevascular mass revealed by fat planes to be to separate lymph nodes, improved in size, with the more medial lesion previously having a  short axis diameter of 1.9 cm, currently 0.9 cm; more lateral noted previously having a short axis of 1.5 cm, currently 1.1 cm.  No other adenopathy in the chest.  Small hiatal hernia.  No significant vascular abnormality observed. Right Port-A-Cath tip:  Cavoatrial junction.  Ground-glass nodule, left upper lobe, 0.5 by 0.4 cm, not well shown on prior PET CT if present at all.  0.4 cm nodule in the left lower lobe, image 30  of series 4.  Mild subsegmental atelectasis in the posterior basal segments of both lower lobes.  Very faint mosaic attenuation in the upper lobes, query air trapping.  IMPRESSION:  1.  Reduced size of the two adjacent prevascular lymph nodes. 2.  Small hiatal hernia. 3.  4 mm ground-glass nodule in the left upper lobe; 4 mm potentially solid nodule in the left lower lobe. If the patient is at high risk for bronchogenic carcinoma, follow-up chest CT at 1 year is recommended.  If the patient is at low risk, no follow-up is needed.  This recommendation follows the consensus statement: Guidelines for Management of Small Pulmonary Nodules Detected on CT Scans:  A Statement from the Fleischner Society as published in Radiology 2005; 237:395-400. 4.  Faint to mosaic attenuation in the upper lobes, query air trapping.  CT ABDOMEN AND PELVIS  Findings:  The liver, spleen, pancreas, and adrenal glands appear unremarkable.  The gallbladder and biliary system appear unremarkable.  The kidneys appear unremarkable, as do the proximal ureters.  Left upper quadrant mesenteric mass partially involves the superior mesenteric artery and wraps around the left side of the superior mesenteric vein.  Mass currently measures 5.4 x 6.1 cm (formerly 11.4 x 11.2 cm) and demonstrates reduced density.  Appendix normal. No pathologic pelvic adenopathy is identified.  Urinary bladder unremarkable.  No dilated bowel. Left periaortic node short axis diameter 0.4 cm (formerly 1.1 cm).  IMPRESSION:  1.  Considerable reduction in  size of the left upper quadrant mesenteric mass and in the retroperitoneal lymph node compared to prior.   Original Report Authenticated By: Gaylyn Rong, M.D.    Ir Fluoro Guide Cv Line Right  11/26/2012  *RADIOLOGY REPORT*  IR REMOVAL TUNNELED ACCESS WITH PORT; MODERATE SEDATION; PICC PLACEMENT WITH ULTRASOUND AND FLUOROSCOPIC  GUIDANCE  Clinical History: 34 old male with a history of lymphoma and right IJ portacatheter placed on August 30, 2012.  The port has function well without issue thus far.  However, he has recently developed fevers of known etiology but has responded to intravenous antibiotics.  There is some concern that the catheter of the Port-A- Cath has become colonized and is now a source for continued infection.  He has no pain at the port reservoir site.  He presents for removal of the portacatheter, culture the catheter tip in placement of a new PICC for chemotherapy completion.  Fluoroscopy Time: 0.2 minutes.  Sedation:  Moderate (conscious) sedation was administered.  The patient and his vital signs were monitored continuously by radiology nursing throughout the duration of the procedure.  A total of 2 mg Versed and 100 mcg of Fentanyl were administered intravenously.  Total Moderate Sedation Time:  30 minutes.  Antibiotic Prophylaxis:  1.5 mg vancomycin administered intravenously prior to the skin incision  Procedure:  Port Removal: (Anterior chest was prepped and draped in a standard sterile fashion using Betadine skin prep.  Maximal barrier technique (cat then mass, sterile gowns, still well, large sterile sheath, and 19 and cutaneous antiseptic) were employed.  Local anesthesia was obtained by infiltration of 1% lidocaine.  A small skin incision was made with a #15 blade.  The scarred capsule surrounding the portacatheter was then dissected using a combination of sharp and blunt surgical technique.  The port reservoir and catheter were then carefully removed.  The tip of the port  catheter was sent for culture.  Hemostasis was obtained by manual pressure.  The reservoir pocket was examined.  There was no  evidence of gross infection.  The port reservoir was flushed copiously with sterile saline and then closed in layers using a combination of 3-0 inverted interrupted sub dermal Vicryl sutures and a 4-0 running subcuticular Monocryl suture.  The skin was sealed with Dermabond.  The patient tolerated the procedure well, there was no immediate complication.  PICC Placement: The existing trained lumen was then discarded.  A new sterile tray was prepared.  The right arm was prepped with chlorhexidine, draped in the usual sterile fashion using maximum barrier technique (cap and mask, sterile gown, sterile gloves, large sterile sheet, hand hygiene and cutaneous antiseptic).  Local anesthesia was attained by infiltration with 1% lidocaine.  Ultrasound demonstrated patency of the right basilic vein, and this was documented with an image.  Under real-time ultrasound guidance, this vein was accessed with a 21 gauge micropuncture needle and image documentation was performed.  The needle was exchanged over a guidewire for a peel-away sheath through which a 40 cm 5 [place] dual lumen power injectable PICC was advanced, and positioned with its tip at the lower SVC/right atrial junction.  Fluoroscopy during the procedure and fluoro spot radiograph confirms appropriate catheter position.  The catheter was flushed, secured to the skin with Prolene sutures, and covered with a sterile dressing.  Complications:  None.  The patient tolerated the procedure well.  IMPRESSION:  1.  Successful removal of right IJ approach portacatheter.  The catheter tip was cut and sent for culture.  There is no evidence of infection within the port reservoir pocket.  Therefore, the pocket was closed primarily.  2.  Successful placement of a  right brachial vein approach dual lumen power PICC with sonographic and fluoroscopic guidance.   The catheter is ready for use.  Signed,  Sterling Big, MD Vascular & Interventional Radiologist [facility] Radiology   Original Report Authenticated By: Malachy Moan, M.D.    Ir Removal Gap Inc W/o Fl Mod Sed  11/26/2012  *RADIOLOGY REPORT*  IR REMOVAL TUNNELED ACCESS WITH PORT; MODERATE SEDATION; PICC PLACEMENT WITH ULTRASOUND AND FLUOROSCOPIC  GUIDANCE  Clinical History: 47 old male with a history of lymphoma and right IJ portacatheter placed on August 30, 2012.  The port has function well without issue thus far.  However, he has recently developed fevers of known etiology but has responded to intravenous antibiotics.  There is some concern that the catheter of the Port-A- Cath has become colonized and is now a source for continued infection.  He has no pain at the port reservoir site.  He presents for removal of the portacatheter, culture the catheter tip in placement of a new PICC for chemotherapy completion.  Fluoroscopy Time: 0.2 minutes.  Sedation:  Moderate (conscious) sedation was administered.  The patient and his vital signs were monitored continuously by radiology nursing throughout the duration of the procedure.  A total of 2 mg Versed and 100 mcg of Fentanyl were administered intravenously.  Total Moderate Sedation Time:  30 minutes.  Antibiotic Prophylaxis:  1.5 mg vancomycin administered intravenously prior to the skin incision  Procedure:  Port Removal: (Anterior chest was prepped and draped in a standard sterile fashion using Betadine skin prep.  Maximal barrier technique (cat then mass, sterile gowns, still well, large sterile sheath, and 19 and cutaneous antiseptic) were employed.  Local anesthesia was obtained by infiltration of 1% lidocaine.  A small skin incision was made with a #15 blade.  The scarred capsule surrounding the portacatheter was then dissected using  a combination of sharp and blunt surgical technique.  The port reservoir and catheter were then  carefully removed.  The tip of the port catheter was sent for culture.  Hemostasis was obtained by manual pressure.  The reservoir pocket was examined.  There was no evidence of gross infection.  The port reservoir was flushed copiously with sterile saline and then closed in layers using a combination of 3-0 inverted interrupted sub dermal Vicryl sutures and a 4-0 running subcuticular Monocryl suture.  The skin was sealed with Dermabond.  The patient tolerated the procedure well, there was no immediate complication.  PICC Placement: The existing trained lumen was then discarded.  A new sterile tray was prepared.  The right arm was prepped with chlorhexidine, draped in the usual sterile fashion using maximum barrier technique (cap and mask, sterile gown, sterile gloves, large sterile sheet, hand hygiene and cutaneous antiseptic).  Local anesthesia was attained by infiltration with 1% lidocaine.  Ultrasound demonstrated patency of the right basilic vein, and this was documented with an image.  Under real-time ultrasound guidance, this vein was accessed with a 21 gauge micropuncture needle and image documentation was performed.  The needle was exchanged over a guidewire for a peel-away sheath through which a 40 cm 5 [place] dual lumen power injectable PICC was advanced, and positioned with its tip at the lower SVC/right atrial junction.  Fluoroscopy during the procedure and fluoro spot radiograph confirms appropriate catheter position.  The catheter was flushed, secured to the skin with Prolene sutures, and covered with a sterile dressing.  Complications:  None.  The patient tolerated the procedure well.  IMPRESSION:  1.  Successful removal of right IJ approach portacatheter.  The catheter tip was cut and sent for culture.  There is no evidence of infection within the port reservoir pocket.  Therefore, the pocket was closed primarily.  2.  Successful placement of a  right brachial vein approach dual lumen power PICC  with sonographic and fluoroscopic guidance.  The catheter is ready for use.  Signed,  Sterling Big, MD Vascular & Interventional Radiologist [facility] Radiology   Original Report Authenticated By: Malachy Moan, M.D.    Ir US Guide Vasc Access Right  11/26/2012  *RADIOLOGY REPORT*  IR REMOVAL TUNNELED ACCESS WITH PORT; MODERATE SEDATION; PICC PLACEMENT WITH ULTRASOUND AND FLUOROSCOPIC  GUIDANCE  Clinical History: 47 old male with a history of lymphoma and right IJ portacatheter placed on August 30, 2012.  The port has function well without issue thus far.  However, he has recently developed fevers of known etiology but has responded to intravenous antibiotics.  There is some concern that the catheter of the Port-A- Cath has become colonized and is now a source for continued infection.  He has no pain at the port reservoir site.  He presents for removal of the portacatheter, culture the catheter tip in placement of a new PICC for chemotherapy completion.  Fluoroscopy Time: 0.2 minutes.  Sedation:  Moderate (conscious) sedation was administered.  The patient and his vital signs were monitored continuously by radiology nursing throughout the duration of the procedure.  A total of 2 mg Versed and 100 mcg of Fentanyl were administered intravenously.  Total Moderate Sedation Time:  30 minutes.  Antibiotic Prophylaxis:  1.5 mg vancomycin administered intravenously prior to the skin incision  Procedure:  Port Removal: (Anterior chest was prepped and draped in a standard sterile fashion using Betadine skin prep.  Maximal barrier technique (cat then mass, sterile gowns, still  well, large sterile sheath, and 19 and cutaneous antiseptic) were employed.  Local anesthesia was obtained by infiltration of 1% lidocaine.  A small skin incision was made with a #15 blade.  The scarred capsule surrounding the portacatheter was then dissected using a combination of sharp and blunt surgical technique.  The port reservoir  and catheter were then carefully removed.  The tip of the port catheter was sent for culture.  Hemostasis was obtained by manual pressure.  The reservoir pocket was examined.  There was no evidence of gross infection.  The port reservoir was flushed copiously with sterile saline and then closed in layers using a combination of 3-0 inverted interrupted sub dermal Vicryl sutures and a 4-0 running subcuticular Monocryl suture.  The skin was sealed with Dermabond.  The patient tolerated the procedure well, there was no immediate complication.  PICC Placement: The existing trained lumen was then discarded.  A new sterile tray was prepared.  The right arm was prepped with chlorhexidine, draped in the usual sterile fashion using maximum barrier technique (cap and mask, sterile gown, sterile gloves, large sterile sheet, hand hygiene and cutaneous antiseptic).  Local anesthesia was attained by infiltration with 1% lidocaine.  Ultrasound demonstrated patency of the right basilic vein, and this was documented with an image.  Under real-time ultrasound guidance, this vein was accessed with a 21 gauge micropuncture needle and image documentation was performed.  The needle was exchanged over a guidewire for a peel-away sheath through which a 40 cm 5 [place] dual lumen power injectable PICC was advanced, and positioned with its tip at the lower SVC/right atrial junction.  Fluoroscopy during the procedure and fluoro spot radiograph confirms appropriate catheter position.  The catheter was flushed, secured to the skin with Prolene sutures, and covered with a sterile dressing.  Complications:  None.  The patient tolerated the procedure well.  IMPRESSION:  1.  Successful removal of right IJ approach portacatheter.  The catheter tip was cut and sent for culture.  There is no evidence of infection within the port reservoir pocket.  Therefore, the pocket was closed primarily.  2.  Successful placement of a  right brachial vein approach  dual lumen power PICC with sonographic and fluoroscopic guidance.  The catheter is ready for use.  Signed,  Sterling Big, MD Vascular & Interventional Radiologist [facility]iology   Original Report Authenticated By: Malachy Moan, M.D.     Microbiology: Recent Results (from the past 240 hour(s))  CLOSTRIDIUM DIFFICILE BY PCR     Status: None   Collection Time    11/18/12  7:49 PM      Result Value Range Status   C difficile by pcr NEGATIVE  NEGATIVE Final  CULTURE, BLOOD (ROUTINE X 2)     Status: None   Collection Time    11/20/12  8:20 PM      Result Value Range Status   Specimen Description BLOOD RIGHT HAND   Final   Special Requests BOTTLES DRAWN AEROBIC AND ANAEROBIC 10CC   Final   Culture  Setup Time 11/21/2012 01:53   Final   Culture NO GROWTH 5 DAYS   Final   Report Status 11/27/2012 FINAL   Final  CULTURE, BLOOD (ROUTINE X 2)     Status: None   Collection Time    11/20/12  8:24 PM      Result Value Range Status   Specimen Description BLOOD PORTA CATH   Final   Special Requests BOTTLES DRAWN AEROBIC  AND ANAEROBIC 10CC   Final   Culture  Setup Time 11/21/2012 01:53   Final   Culture NO GROWTH 5 DAYS   Final   Report Status 11/27/2012 FINAL   Final  URINE CULTURE     Status: None   Collection Time    11/21/12 10:55 AM      Result Value Range Status   Specimen Description URINE, RANDOM   Final   Special Requests NONE   Final   Culture  Setup Time 11/21/2012 14:09   Final   Colony Count NO GROWTH   Final   Culture NO GROWTH   Final   Report Status 11/22/2012 FINAL   Final  CULTURE, BLOOD (ROUTINE X 2)     Status: None   Collection Time    11/22/12  8:30 AM      Result Value Range Status   Specimen Description BLOOD PORT   Final   Special Requests BOTTLES DRAWN AEROBIC AND ANAEROBIC 10CC   Final   Culture  Setup Time 11/22/2012 13:23   Final   Culture     Final   Value:        BLOOD CULTURE RECEIVED NO GROWTH TO DATE CULTURE WILL BE HELD FOR 5 DAYS BEFORE  ISSUING A FINAL NEGATIVE REPORT   Report Status PENDING   Incomplete  CULTURE, BLOOD (ROUTINE X 2)     Status: None   Collection Time    11/22/12  9:46 AM      Result Value Range Status   Specimen Description BLOOD LEFT ARM   Final   Special Requests BOTTLES DRAWN AEROBIC AND ANAEROBIC 5CC   Final   Culture  Setup Time 11/22/2012 13:23   Final   Culture     Final   Value:        BLOOD CULTURE RECEIVED NO GROWTH TO DATE CULTURE WILL BE HELD FOR 5 DAYS BEFORE ISSUING A FINAL NEGATIVE REPORT   Report Status PENDING   Incomplete  MRSA PCR SCREENING     Status: None   Collection Time    11/22/12  7:57 PM      Result Value Range Status   MRSA by PCR NEGATIVE  NEGATIVE Final   Comment:            The GeneXpert MRSA Assay (FDA     approved for NASAL specimens     only), is one component of a     comprehensive MRSA colonization     surveillance program. It is not     intended to diagnose MRSA     infection nor to guide or     monitor treatment for     MRSA infections.  CATH TIP CULTURE     Status: None   Collection Time    11/26/12 12:54 PM      Result Value Range Status   Specimen Description CATH TIP   Final   Special Requests NONE   Final   Culture NO GROWTH   Final   Report Status PENDING   Incomplete     Labs: Basic Metabolic Panel:  Recent Labs Lab 11/23/12 0533 11/24/12 0555 11/25/12 0532 11/26/12 0450 11/27/12 0530  NA 136 139 138 137 139  K 3.9 3.8 3.9 3.8 4.0  CL 101 104 104 104 104  CO2 27 26 25 25 27   GLUCOSE 98 110* 101* 99 98  BUN 11 13 13 9 14   CREATININE 0.88 0.87 0.98 0.93 0.89  CALCIUM 8.4  8.7 8.5 8.3* 8.4   Liver Function Tests:  Recent Labs Lab 11/21/12 0500  AST 23  ALT 35  ALKPHOS 77  BILITOT 0.2*  PROT 5.6*  ALBUMIN 2.8*   No results found for this basename: LIPASE, AMYLASE,  in the last 168 hours No results found for this basename: AMMONIA,  in the last 168 hours CBC:  Recent Labs Lab 11/21/12 0500 11/22/12 0605 11/23/12 0533  11/24/12 0555 11/25/12 0532 11/26/12 0450 11/27/12 0530  WBC 9.2 8.1 7.9 8.9 7.2 8.3 7.9  NEUTROABS 7.5 6.7 6.7  --   --  6.5 6.0  HGB 10.2* 10.0* 9.8* 10.0* 10.0* 9.6* 9.5*  HCT 30.3* 30.3* 29.1* 29.7* 30.1* 29.2* 27.3*  MCV 86.3 86.8 86.6 85.8 86.5 86.6 85.0  PLT 161 204 235 262 268 293 297   Cardiac Enzymes: No results found for this basename: CKTOTAL, CKMB, CKMBINDEX, TROPONINI,  in the last 168 hours BNP: BNP (last 3 results) No results found for this basename: PROBNP,  in the last 8760 hours CBG: No results found for this basename: GLUCAP,  in the last 168 hours     Signed:  [facility]  [facility] 11/27/2012, 4:34 PM

## 2012-11-27 NOTE — Progress Notes (Signed)
Patient discharged home in stable condition.  Going home with PICC access-hep locked per IV team.  Instructions and scripts given to patient and spouse with no further questions or concerns.

## 2012-11-28 LAB — CULTURE, BLOOD (ROUTINE X 2): Culture: NO GROWTH

## 2012-11-29 ENCOUNTER — Other Ambulatory Visit: Payer: BC Managed Care – PPO | Admitting: Lab

## 2012-11-29 ENCOUNTER — Ambulatory Visit: Payer: BC Managed Care – PPO | Admitting: Oncology

## 2012-11-29 ENCOUNTER — Ambulatory Visit: Payer: BC Managed Care – PPO

## 2012-11-29 LAB — CATH TIP CULTURE: Culture: NO GROWTH

## 2012-11-30 ENCOUNTER — Ambulatory Visit: Payer: BC Managed Care – PPO

## 2012-12-03 ENCOUNTER — Encounter (HOSPITAL_COMMUNITY): Payer: BC Managed Care – PPO

## 2012-12-04 ENCOUNTER — Encounter: Payer: Self-pay | Admitting: Internal Medicine

## 2012-12-04 ENCOUNTER — Telehealth: Payer: Self-pay

## 2012-12-04 ENCOUNTER — Ambulatory Visit (INDEPENDENT_AMBULATORY_CARE_PROVIDER_SITE_OTHER): Payer: BC Managed Care – PPO | Admitting: Internal Medicine

## 2012-12-04 VITALS — BP 128/82 | HR 80 | Temp 98.0°F | Ht 71.0 in | Wt 220.2 lb

## 2012-12-04 DIAGNOSIS — R509 Fever, unspecified: Secondary | ICD-10-CM

## 2012-12-04 NOTE — Telephone Encounter (Signed)
CathFlo to PICC today order called to [facility].  Last dose of IV antibiotics is 12/05/12 in the evening.  PICC line needs continued [facility] maintenance.  Contact Dr. Leonard Schwartz. [place] for orders to maintain PICC.  Melissa verbalized these orders.

## 2012-12-04 NOTE — Telephone Encounter (Signed)
Home Health Nurse calling to report patient's PICC site is red and sore to touch. They have not been able to draw blood or pull back.  He has two ports and they are both slow.  Nurse wonders if PICC should be pulled.  He is scheduled to stop medications on Wednesday.   I will discuss with Dr Orvan Falconer.    Dr Orvan Falconer has requested to see the patient come by our office so he can assess the PICC.   Laurell Josephs, RN

## 2012-12-04 NOTE — Progress Notes (Signed)
Patient ID: Adam Alvarado, male   DOB: 26-Nov-1955, 57 y.o.   MRN: [id number]         [facility]  Patient Active Problem List  Diagnosis  . Non-Hodgkin lymphoma of intra-abdominal lymph nodes  . Syncope  . Hyponatremia  . Fever  . Anemia  . Nausea with vomiting  . Diarrhea  . Antineoplastic chemotherapy induced pancytopenia    Patient's Medications  New Prescriptions   No medications on file  Previous Medications   ASPIRIN 325 MG TABLET    Take 2 tablets (650 mg total) by mouth every 6 (six) hours as needed for pain or fever.   BACLOFEN (LIORESAL) 10 MG TABLET    Take 0.5 tablets (5 mg total) by mouth 3 (three) times daily as needed (FOR HICCUPS).   DEXTROSE 5 % SOLN 50 ML WITH CEFEPIME 2 G SOLR 2 G    Inject 2 g into the vein every 8 (eight) hours.   LIDOCAINE-PRILOCAINE (EMLA) CREAM    Apply topically as needed. Apply to [facility] 2 hours prior to stick and cover with plastic wrap to numb site   LORAZEPAM (ATIVAN) 1 MG TABLET    Place 1 tablet (1 mg total) under the tongue every 6 (six) hours as needed (nausea).   PREDNISONE (DELTASONE) 20 MG TABLET    Take 5 tablets (100 mg total) by mouth daily. X 5 days Begin on day 1 of each chemo cycle   PROCHLORPERAZINE (COMPAZINE) 10 MG TABLET    Take 1 tablet (10 mg total) by mouth every 6 (six) hours as needed (nausea).   SODIUM CHLORIDE 0.9 % SOLN 500 ML WITH VANCOMYCIN 10 G SOLR 1,500 MG    Inject 1,500 mg into the vein every 12 (twelve) hours.  Modified Medications   No medications on file  Discontinued Medications   No medications on file    Subjective: Adam Alvarado is seen at work in basis this morning. He was recently discharged on IV vancomycin and cefepime through a right arm PICC to treat fever presumed to be related to his previous Port-A-Cath which was removed while he was in the hospital. He and his home nurse became concerned because the PICC site was more red today and wanted him evaluated. One port  has also clotted. He has not had any fevers since discharge and overall is feeling better. He is tolerating his antibiotics well.  Objective: Temp: 98 F (36.7 C) (04/22 1000) Temp src: Oral (04/22 1000) BP: 128/82 mmHg (04/22 1000) Pulse Rate: 80 (04/22 1000)  General: He is in no distress in good spirits Skin: He has a little rim of erythema around the PICC insertion site but no discharge. Lungs: Clear Cor: Regular S1 and S2 no murmurs His right upper chest incision site where the port was removed is healing nicely  Assessment: He has nonspecific redness around the insertion site not convinced that it is infected. He is due to complete his antibiotics tomorrow. I will give in order to see if we can be clot the one port and leave the PICC in for now. He will followup with me in 48 hours, sooner if he has any other signs or symptoms of infection.  Plan: 1. Continue antibiotics through tomorrow's doses 2. We will give in order to [facility] to see if we can be clot the one clotted port 3. Followup in 48 hours   [name], MD [facility] [phone] pager   [phone] cell 12/04/2012, 10:13 AM

## 2012-12-05 ENCOUNTER — Telehealth: Payer: Self-pay | Admitting: Oncology

## 2012-12-05 ENCOUNTER — Ambulatory Visit (HOSPITAL_BASED_OUTPATIENT_CLINIC_OR_DEPARTMENT_OTHER): Payer: BC Managed Care – PPO | Admitting: Oncology

## 2012-12-05 ENCOUNTER — Other Ambulatory Visit (HOSPITAL_BASED_OUTPATIENT_CLINIC_OR_DEPARTMENT_OTHER): Payer: BC Managed Care – PPO | Admitting: Lab

## 2012-12-05 VITALS — BP 135/85 | HR 86 | Temp 97.6°F | Resp 20 | Ht 71.0 in | Wt 219.4 lb

## 2012-12-05 DIAGNOSIS — C8583 Other specified types of non-Hodgkin lymphoma, intra-abdominal lymph nodes: Secondary | ICD-10-CM

## 2012-12-05 DIAGNOSIS — C8593 Non-Hodgkin lymphoma, unspecified, intra-abdominal lymph nodes: Secondary | ICD-10-CM

## 2012-12-05 DIAGNOSIS — C8589 Other specified types of non-Hodgkin lymphoma, extranodal and solid organ sites: Secondary | ICD-10-CM

## 2012-12-05 LAB — COMPREHENSIVE METABOLIC PANEL (CC13)
Albumin: 3.1 g/dL — ABNORMAL LOW (ref 3.5–5.0)
Alkaline Phosphatase: 85 U/L (ref 40–150)
BUN: 14.9 mg/dL (ref 7.0–26.0)
Glucose: 104 mg/dl — ABNORMAL HIGH (ref 70–99)
Total Bilirubin: 0.25 mg/dL (ref 0.20–1.20)

## 2012-12-05 LAB — CBC WITH DIFFERENTIAL/PLATELET
Basophils Absolute: 0.2 10*3/uL — ABNORMAL HIGH (ref 0.0–0.1)
EOS%: 1.6 % (ref 0.0–7.0)
HCT: 36.5 % — ABNORMAL LOW (ref 38.4–49.9)
HGB: 11.8 g/dL — ABNORMAL LOW (ref 13.0–17.1)
MCH: 27.6 pg (ref 27.2–33.4)
MCV: 85.1 fL (ref 79.3–98.0)
MONO%: 16 % — ABNORMAL HIGH (ref 0.0–14.0)
NEUT%: 67.8 % (ref 39.0–75.0)

## 2012-12-05 NOTE — Progress Notes (Signed)
OFFICE PROGRESS NOTE  Interval history:  Adam Alvarado returns as scheduled. He completed cycle 4 CHOP/Rituxan on 11/08/2012. He received Neulasta support. He was hospitalized 11/15/2012 through 11/27/2012 with fever. No source of infection was identified. He continued to have fevers and sweats. Ultimately the Port-A-Cath was removed and a PICC line was placed. The fevers improved. He was discharged home on IV vancomycin and cefepime. He is scheduled to complete the course of IV antibiotics on 12/06/2012.  The day of discharge he had a temperature of 100.4. He subsequently has had some temperatures in the 99 range. No high fevers. No shaking chills. He periodically feels chilled. He has had one episode of "drenching" night sweats since discharge. He has had a single episode of nausea/vomiting. He has intermittent loose stools.   Objective: Blood pressure 135/85, pulse 86, temperature 97.6 F (36.4 C), temperature source Oral, resp. rate 20, height 5\' 11"  (1.803 m), weight 219 lb 6.4 oz (99.519 kg).  Oropharynx is without thrush or ulceration. Lungs are clear. Regular cardiac rhythm. Healing incision at the right chest at the Port-A-Cath removal site. No surrounding erythema. Abdomen is soft and nontender. No organomegaly. Extremities are without edema. Right upper extremity PICC site with mild erythema.  Lab Results: Lab Results  Component Value Date   WBC 7.9 12/05/2012   HGB 11.8* 12/05/2012   HCT 36.5* 12/05/2012   MCV 85.1 12/05/2012   PLT 377 12/05/2012    Chemistry:    Chemistry      Component Value Date/Time   NA 139 11/27/2012 0530   NA 141 11/08/2012 0901   K 4.0 11/27/2012 0530   K 4.2 11/08/2012 0901   CL 104 11/27/2012 0530   CL 107 11/08/2012 0901   CO2 27 11/27/2012 0530   CO2 26 11/08/2012 0901   BUN 14 11/27/2012 0530   BUN 11.7 11/08/2012 0901   CREATININE 0.89 11/27/2012 0530   CREATININE 1.0 11/08/2012 0901      Component Value Date/Time   CALCIUM 8.4 11/27/2012 0530    CALCIUM 9.0 11/08/2012 0901   ALKPHOS 77 11/21/2012 0500   ALKPHOS 91 11/08/2012 0901   AST 23 11/21/2012 0500   AST 17 11/08/2012 0901   ALT 35 11/21/2012 0500   ALT 21 11/08/2012 0901   BILITOT 0.2* 11/21/2012 0500   BILITOT 0.22 11/08/2012 0901       Studies/Results: Dg Chest 2 View  11/15/2012  *RADIOLOGY REPORT*  Clinical Data: Loss of consciousness.  Fever.  Lymphoma.  CHEST - 2 VIEW  Comparison: 12/01/2004  Findings: Lungs are clear.  Negative for pneumonia or effusion. Vascularity normal.  Port-A-Cath tip in the SVC. Negative for adenopathy or mass.  IMPRESSION: No active cardiopulmonary abnormality.  Negative for pneumonia.   Original Report Authenticated By: Janeece Riggers, M.D.    Ct Head Wo Contrast  11/15/2012  *RADIOLOGY REPORT*  Clinical Data: History of lymphoma.  Syncope.  CT HEAD WITHOUT CONTRAST  Technique:  Contiguous axial images were obtained from the base of the skull through the vertex without contrast.  Comparison: None.  Findings: No mass effect, midline shift, or acute intracranial hemorrhage.  Dilated perivascular space in the lower left basal ganglia.  Brain parenchyma and ventricles system are within normal limits.  Mastoid air cells are clear.  Visualized paranasal sinuses are clear.  IMPRESSION: No acute intracranial pathology.   Original Report Authenticated By: Jolaine Click, M.D.    Ct Chest W Contrast  11/20/2012  *RADIOLOGY REPORT*  Clinical Data:  Non-Hodgkins lymphoma.  Low grade fever.  Left upper quadrant abdominal mass.  CT CHEST, ABDOMEN AND PELVIS WITH CONTRAST  Technique:  Multidetector CT imaging of the chest, abdomen and pelvis was performed following the standard protocol during bolus administration of intravenous contrast.  Contrast: OMNIPAQUE IOHEXOL 300 MG/ML  SOLN  Comparison:  Multiple exams, including 09/05/2012  CT CHEST  Findings:  Prior prevascular mass revealed by fat planes to be to separate lymph nodes, improved in size, with the more medial lesion  previously having a short axis diameter of 1.9 cm, currently 0.9 cm; more lateral noted previously having a short axis of 1.5 cm, currently 1.1 cm.  No other adenopathy in the chest.  Small hiatal hernia.  No significant vascular abnormality observed. Right Port-A-Cath tip:  Cavoatrial junction.  Ground-glass nodule, left upper lobe, 0.5 by 0.4 cm, not well shown on prior PET CT if present at all.  0.4 cm nodule in the left lower lobe, image 30 of series 4.  Mild subsegmental atelectasis in the posterior basal segments of both lower lobes.  Very faint mosaic attenuation in the upper lobes, query air trapping.  IMPRESSION:  1.  Reduced size of the two adjacent prevascular lymph nodes. 2.  Small hiatal hernia. 3.  4 mm ground-glass nodule in the left upper lobe; 4 mm potentially solid nodule in the left lower lobe. If the patient is at high risk for bronchogenic carcinoma, follow-up chest CT at 1 year is recommended.  If the patient is at low risk, no follow-up is needed.  This recommendation follows the consensus statement: Guidelines for Management of Small Pulmonary Nodules Detected on CT Scans:  A Statement from the Fleischner Society as published in Radiology 2005; 237:395-400. 4.  Faint to mosaic attenuation in the upper lobes, query air trapping.  CT ABDOMEN AND PELVIS  Findings:  The liver, spleen, pancreas, and adrenal glands appear unremarkable.  The gallbladder and biliary system appear unremarkable.  The kidneys appear unremarkable, as do the proximal ureters.  Left upper quadrant mesenteric mass partially involves the superior mesenteric artery and wraps around the left side of the superior mesenteric vein.  Mass currently measures 5.4 x 6.1 cm (formerly 11.4 x 11.2 cm) and demonstrates reduced density.  Appendix normal. No pathologic pelvic adenopathy is identified.  Urinary bladder unremarkable.  No dilated bowel. Left periaortic node short axis diameter 0.4 cm (formerly 1.1 cm).  IMPRESSION:  1.   Considerable reduction in size of the left upper quadrant mesenteric mass and in the retroperitoneal lymph node compared to prior.   Original Report Authenticated By: Gaylyn Rong, M.D.    Ct Abdomen Pelvis W Contrast  11/20/2012  *RADIOLOGY REPORT*  Clinical Data:  Non-Hodgkins lymphoma.  Low grade fever.  Left upper quadrant abdominal mass.  CT CHEST, ABDOMEN AND PELVIS WITH CONTRAST  Technique:  Multidetector CT imaging of the chest, abdomen and pelvis was performed following the standard protocol during bolus administration of intravenous contrast.  Contrast: OMNIPAQUE IOHEXOL 300 MG/ML  SOLN  Comparison:  Multiple exams, including 09/05/2012  CT CHEST  Findings:  Prior prevascular mass revealed by fat planes to be to separate lymph nodes, improved in size, with the more medial lesion previously having a short axis diameter of 1.9 cm, currently 0.9 cm; more lateral noted previously having a short axis of 1.5 cm, currently 1.1 cm.  No other adenopathy in the chest.  Small hiatal hernia.  No significant vascular abnormality observed. Right Port-A-Cath tip:  Cavoatrial junction.  Ground-glass nodule, left upper lobe, 0.5 by 0.4 cm, not well shown on prior PET CT if present at all.  0.4 cm nodule in the left lower lobe, image 30 of series 4.  Mild subsegmental atelectasis in the posterior basal segments of both lower lobes.  Very faint mosaic attenuation in the upper lobes, query air trapping.  IMPRESSION:  1.  Reduced size of the two adjacent prevascular lymph nodes. 2.  Small hiatal hernia. 3.  4 mm ground-glass nodule in the left upper lobe; 4 mm potentially solid nodule in the left lower lobe. If the patient is at high risk for bronchogenic carcinoma, follow-up chest CT at 1 year is recommended.  If the patient is at low risk, no follow-up is needed.  This recommendation follows the consensus statement: Guidelines for Management of Small Pulmonary Nodules Detected on CT Scans:  A Statement from the  Fleischner Society as published in Radiology 2005; 237:395-400. 4.  Faint to mosaic attenuation in the upper lobes, query air trapping.  CT ABDOMEN AND PELVIS  Findings:  The liver, spleen, pancreas, and adrenal glands appear unremarkable.  The gallbladder and biliary system appear unremarkable.  The kidneys appear unremarkable, as do the proximal ureters.  Left upper quadrant mesenteric mass partially involves the superior mesenteric artery and wraps around the left side of the superior mesenteric vein.  Mass currently measures 5.4 x 6.1 cm (formerly 11.4 x 11.2 cm) and demonstrates reduced density.  Appendix normal. No pathologic pelvic adenopathy is identified.  Urinary bladder unremarkable.  No dilated bowel. Left periaortic node short axis diameter 0.4 cm (formerly 1.1 cm).  IMPRESSION:  1.  Considerable reduction in size of the left upper quadrant mesenteric mass and in the retroperitoneal lymph node compared to prior.   Original Report Authenticated By: Gaylyn Rong, M.D.    Ir Fluoro Guide Cv Line Right  11/26/2012  *RADIOLOGY REPORT*  IR REMOVAL TUNNELED ACCESS WITH PORT; MODERATE SEDATION; PICC PLACEMENT WITH ULTRASOUND AND FLUOROSCOPIC  GUIDANCE  Clinical History: 71 old male with a history of lymphoma and right IJ portacatheter placed on August 30, 2012.  The port has function well without issue thus far.  However, he has recently developed fevers of known etiology but has responded to intravenous antibiotics.  There is some concern that the catheter of the Port-A- Cath has become colonized and is now a source for continued infection.  He has no pain at the port reservoir site.  He presents for removal of the portacatheter, culture the catheter tip in placement of a new PICC for chemotherapy completion.  Fluoroscopy Time: 0.2 minutes.  Sedation:  Moderate (conscious) sedation was administered.  The patient and his vital signs were monitored continuously by radiology nursing throughout the  duration of the procedure.  A total of 2 mg Versed and 100 mcg of Fentanyl were administered intravenously.  Total Moderate Sedation Time:  30 minutes.  Antibiotic Prophylaxis:  1.5 mg vancomycin administered intravenously prior to the skin incision  Procedure:  Port Removal: (Anterior chest was prepped and draped in a standard sterile fashion using Betadine skin prep.  Maximal barrier technique (cat then mass, sterile gowns, still well, large sterile sheath, and 19 and cutaneous antiseptic) were employed.  Local anesthesia was obtained by infiltration of 1% lidocaine.  A small skin incision was made with a #15 blade.  The scarred capsule surrounding the portacatheter was then dissected using a combination of sharp and blunt surgical technique.  The port reservoir  and catheter were then carefully removed.  The tip of the port catheter was sent for culture.  Hemostasis was obtained by manual pressure.  The reservoir pocket was examined.  There was no evidence of gross infection.  The port reservoir was flushed copiously with sterile saline and then closed in layers using a combination of 3-0 inverted interrupted sub dermal Vicryl sutures and a 4-0 running subcuticular Monocryl suture.  The skin was sealed with Dermabond.  The patient tolerated the procedure well, there was no immediate complication.  PICC Placement: The existing trained lumen was then discarded.  A new sterile tray was prepared.  The right arm was prepped with chlorhexidine, draped in the usual sterile fashion using maximum barrier technique (cap and mask, sterile gown, sterile gloves, large sterile sheet, hand hygiene and cutaneous antiseptic).  Local anesthesia was attained by infiltration with 1% lidocaine.  Ultrasound demonstrated patency of the right basilic vein, and this was documented with an image.  Under real-time ultrasound guidance, this vein was accessed with a 21 gauge micropuncture needle and image documentation was performed.  The  needle was exchanged over a guidewire for a peel-away sheath through which a 40 cm 5 [place] dual lumen power injectable PICC was advanced, and positioned with its tip at the lower SVC/right atrial junction.  Fluoroscopy during the procedure and fluoro spot radiograph confirms appropriate catheter position.  The catheter was flushed, secured to the skin with Prolene sutures, and covered with a sterile dressing.  Complications:  None.  The patient tolerated the procedure well.  IMPRESSION:  1.  Successful removal of right IJ approach portacatheter.  The catheter tip was cut and sent for culture.  There is no evidence of infection within the port reservoir pocket.  Therefore, the pocket was closed primarily.  2.  Successful placement of a  right brachial vein approach dual lumen power PICC with sonographic and fluoroscopic guidance.  The catheter is ready for use.  Signed,  Sterling Big, MD Vascular & Interventional Radiologist [facility] Radiology   Original Report Authenticated By: Malachy Moan, M.D.    Ir Removal Gap Inc W/o Fl Mod Sed  11/26/2012  *RADIOLOGY REPORT*  IR REMOVAL TUNNELED ACCESS WITH PORT; MODERATE SEDATION; PICC PLACEMENT WITH ULTRASOUND AND FLUOROSCOPIC  GUIDANCE  Clinical History: 68 old male with a history of lymphoma and right IJ portacatheter placed on August 30, 2012.  The port has function well without issue thus far.  However, he has recently developed fevers of known etiology but has responded to intravenous antibiotics.  There is some concern that the catheter of the Port-A- Cath has become colonized and is now a source for continued infection.  He has no pain at the port reservoir site.  He presents for removal of the portacatheter, culture the catheter tip in placement of a new PICC for chemotherapy completion.  Fluoroscopy Time: 0.2 minutes.  Sedation:  Moderate (conscious) sedation was administered.  The patient and his vital signs were monitored continuously  by radiology nursing throughout the duration of the procedure.  A total of 2 mg Versed and 100 mcg of Fentanyl were administered intravenously.  Total Moderate Sedation Time:  30 minutes.  Antibiotic Prophylaxis:  1.5 mg vancomycin administered intravenously prior to the skin incision  Procedure:  Port Removal: (Anterior chest was prepped and draped in a standard sterile fashion using Betadine skin prep.  Maximal barrier technique (cat then mass, sterile gowns, still well, large sterile sheath, and 19 and cutaneous  antiseptic) were employed.  Local anesthesia was obtained by infiltration of 1% lidocaine.  A small skin incision was made with a #15 blade.  The scarred capsule surrounding the portacatheter was then dissected using a combination of sharp and blunt surgical technique.  The port reservoir and catheter were then carefully removed.  The tip of the port catheter was sent for culture.  Hemostasis was obtained by manual pressure.  The reservoir pocket was examined.  There was no evidence of gross infection.  The port reservoir was flushed copiously with sterile saline and then closed in layers using a combination of 3-0 inverted interrupted sub dermal Vicryl sutures and a 4-0 running subcuticular Monocryl suture.  The skin was sealed with Dermabond.  The patient tolerated the procedure well, there was no immediate complication.  PICC Placement: The existing trained lumen was then discarded.  A new sterile tray was prepared.  The right arm was prepped with chlorhexidine, draped in the usual sterile fashion using maximum barrier technique (cap and mask, sterile gown, sterile gloves, large sterile sheet, hand hygiene and cutaneous antiseptic).  Local anesthesia was attained by infiltration with 1% lidocaine.  Ultrasound demonstrated patency of the right basilic vein, and this was documented with an image.  Under real-time ultrasound guidance, this vein was accessed with a 21 gauge micropuncture needle and image  documentation was performed.  The needle was exchanged over a guidewire for a peel-away sheath through which a 40 cm 5 [place] dual lumen power injectable PICC was advanced, and positioned with its tip at the lower SVC/right atrial junction.  Fluoroscopy during the procedure and fluoro spot radiograph confirms appropriate catheter position.  The catheter was flushed, secured to the skin with Prolene sutures, and covered with a sterile dressing.  Complications:  None.  The patient tolerated the procedure well.  IMPRESSION:  1.  Successful removal of right IJ approach portacatheter.  The catheter tip was cut and sent for culture.  There is no evidence of infection within the port reservoir pocket.  Therefore, the pocket was closed primarily.  2.  Successful placement of a  right brachial vein approach dual lumen power PICC with sonographic and fluoroscopic guidance.  The catheter is ready for use.  Signed,  Sterling Big, MD Vascular & Interventional Radiologist [facility] Radiology   Original Report Authenticated By: Malachy Moan, M.D.    Ir US Guide Vasc Access Right  11/26/2012  *RADIOLOGY REPORT*  IR REMOVAL TUNNELED ACCESS WITH PORT; MODERATE SEDATION; PICC PLACEMENT WITH ULTRASOUND AND FLUOROSCOPIC  GUIDANCE  Clinical History: 25 old male with a history of lymphoma and right IJ portacatheter placed on August 30, 2012.  The port has function well without issue thus far.  However, he has recently developed fevers of known etiology but has responded to intravenous antibiotics.  There is some concern that the catheter of the Port-A- Cath has become colonized and is now a source for continued infection.  He has no pain at the port reservoir site.  He presents for removal of the portacatheter, culture the catheter tip in placement of a new PICC for chemotherapy completion.  Fluoroscopy Time: 0.2 minutes.  Sedation:  Moderate (conscious) sedation was administered.  The patient and his vital signs were  monitored continuously by radiology nursing throughout the duration of the procedure.  A total of 2 mg Versed and 100 mcg of Fentanyl were administered intravenously.  Total Moderate Sedation Time:  30 minutes.  Antibiotic Prophylaxis:  1.5 mg vancomycin administered intravenously  prior to the skin incision  Procedure:  Port Removal: (Anterior chest was prepped and draped in a standard sterile fashion using Betadine skin prep.  Maximal barrier technique (cat then mass, sterile gowns, still well, large sterile sheath, and 19 and cutaneous antiseptic) were employed.  Local anesthesia was obtained by infiltration of 1% lidocaine.  A small skin incision was made with a #15 blade.  The scarred capsule surrounding the portacatheter was then dissected using a combination of sharp and blunt surgical technique.  The port reservoir and catheter were then carefully removed.  The tip of the port catheter was sent for culture.  Hemostasis was obtained by manual pressure.  The reservoir pocket was examined.  There was no evidence of gross infection.  The port reservoir was flushed copiously with sterile saline and then closed in layers using a combination of 3-0 inverted interrupted sub dermal Vicryl sutures and a 4-0 running subcuticular Monocryl suture.  The skin was sealed with Dermabond.  The patient tolerated the procedure well, there was no immediate complication.  PICC Placement: The existing trained lumen was then discarded.  A new sterile tray was prepared.  The right arm was prepped with chlorhexidine, draped in the usual sterile fashion using maximum barrier technique (cap and mask, sterile gown, sterile gloves, large sterile sheet, hand hygiene and cutaneous antiseptic).  Local anesthesia was attained by infiltration with 1% lidocaine.  Ultrasound demonstrated patency of the right basilic vein, and this was documented with an image.  Under real-time ultrasound guidance, this vein was accessed with a 21 gauge  micropuncture needle and image documentation was performed.  The needle was exchanged over a guidewire for a peel-away sheath through which a 40 cm 5 [place] dual lumen power injectable PICC was advanced, and positioned with its tip at the lower SVC/right atrial junction.  Fluoroscopy during the procedure and fluoro spot radiograph confirms appropriate catheter position.  The catheter was flushed, secured to the skin with Prolene sutures, and covered with a sterile dressing.  Complications:  None.  The patient tolerated the procedure well.  IMPRESSION:  1.  Successful removal of right IJ approach portacatheter.  The catheter tip was cut and sent for culture.  There is no evidence of infection within the port reservoir pocket.  Therefore, the pocket was closed primarily.  2.  Successful placement of a  right brachial vein approach dual lumen power PICC with sonographic and fluoroscopic guidance.  The catheter is ready for use.  Signed,  Sterling Big, MD Vascular & Interventional Radiologist [facility] Radiology   Original Report Authenticated By: Malachy Moan, M.D.     Medications: I have reviewed the patient's current medications.  Assessment/Plan:  1. Large abdominal mass surrounding small lymph nodes status post CT-guided biopsy on 07/20/2012 with the pathology suggestive of non-Hodgkin's lymphoma. Status post laparoscopic biopsy of the left abdominal mass on 08/21/2011 with final pathology confirming a large B-cell lymphoma, CD20 positive, with both follicular and diffuse patterns. Negative staging bone marrow biopsy. Status post cycle 1 CHOP/Rituxan on 09/06/2012. He completed cycle 4 11/08/2012 with Neulasta support.  2. Nausea/vomiting following cycle 1 CHOP/Rituxan. Antiemetic regimen was adjusted with cycle 2 to include Aloxi, Decadron and Emend. He continues to have nausea following chemotherapy. 3. Status post Port-A-Cath placement 08/30/2012. Status post Port-A-Cath removal  11/26/2012. 4. Neutropenia following cycle 2 CHOP/Rituxan. Neulasta was added beginning with cycle 3. 5. Hiccups following treatment. He will try baclofen 5 mg 3 times a day as needed. 6. "Night  sweats "-etiology unclear. Improved. 7. Hospitalization 11/15/2012 through 11/27/2012 with fever. No source of infection identified. Port-A-Cath was removed on 11/26/2012. He continues IV antibiotics. He will complete the course of IV antibiotics 12/06/2012. Fevers and sweats are better. He is being followed by Dr. Orvan Falconer. 8. Mild erythema at the PICC insertion site. We will continue to monitor.  Disposition-Adam Alvarado appears improved. He will complete the course of IV antibiotics on 12/06/2012. He is scheduled for cycle 5 CHOP/Rituxan on 12/10/2012 with Neulasta support. He will return for a followup visit with Dr. Truett Perna on 12/20/2012. He will contact the office in the interim with any problems. We specifically discussed recurrent high fever, shaking chills, increased erythema at the PICC site.  Patient seen with Dr. Truett Perna.  Adam Alvarado ANP/GNP-BC

## 2012-12-05 NOTE — Telephone Encounter (Signed)
gv adn printed appt sched and avs for pt °

## 2012-12-06 ENCOUNTER — Ambulatory Visit (INDEPENDENT_AMBULATORY_CARE_PROVIDER_SITE_OTHER): Payer: BC Managed Care – PPO | Admitting: Internal Medicine

## 2012-12-06 ENCOUNTER — Encounter: Payer: Self-pay | Admitting: Internal Medicine

## 2012-12-06 DIAGNOSIS — R509 Fever, unspecified: Secondary | ICD-10-CM

## 2012-12-06 NOTE — Progress Notes (Signed)
Patient ID: Adam Alvarado, male   DOB: Sep 18, 1955, 57 y.o.   MRN: [id number]         [facility]  Patient Active Problem List  Diagnosis  . Non-Hodgkin lymphoma of intra-abdominal lymph nodes  . Syncope  . Hyponatremia  . Fever  . Anemia  . Nausea with vomiting  . Diarrhea  . Antineoplastic chemotherapy induced pancytopenia    Patient's Medications  New Prescriptions   No medications on file  Previous Medications   ASPIRIN 325 MG TABLET    Take 2 tablets (650 mg total) by mouth every 6 (six) hours as needed for pain or fever.   BACLOFEN (LIORESAL) 10 MG TABLET    Take 0.5 tablets (5 mg total) by mouth 3 (three) times daily as needed (FOR HICCUPS).   LIDOCAINE-PRILOCAINE (EMLA) CREAM    Apply topically as needed. Apply to [address] [facility] 2 hours prior to stick and cover with plastic wrap to numb site   LORAZEPAM (ATIVAN) 1 MG TABLET    Place 1 tablet (1 mg total) under the tongue every 6 (six) hours as needed (nausea).   PREDNISONE (DELTASONE) 20 MG TABLET    Take 5 tablets (100 mg total) by mouth daily. X 5 days Begin on day 1 of each chemo cycle   PROCHLORPERAZINE (COMPAZINE) 10 MG TABLET    Take 1 tablet (10 mg total) by mouth every 6 (six) hours as needed (nausea).   PROCTOZONE-HC 2.5 % RECTAL CREAM      Modified Medications   No medications on file  Discontinued Medications   No medications on file    Subjective: Adam Alvarado is in for his routine followup visit. He's had no further problems with fever, chills or sweats and feels like his right arm PICC site is looking better. He is due to start his next round of chemotherapy on April 28. He completes his IV vancomycin and cefepime therapy today.  Objective:    General: He looks well and is in good spirits Skin: Right arm PICC site looks good with decreased erythema over the last 48 hours Lungs: Clear Cor: Regular S1 and S2 no murmurs   Assessment: His fevers have responded well to removal of his  Port-A-Cath and 2 weeks of IV antibiotics. Although all blood cultures were negative his temperature curve strongly suggested that he had bacterial infection since his fevers improved on 2 separate occasions while on antibiotics. We will stop antibiotics after his last dose today. He knows to call me right away if he has any recurrence of his fever, chills or sweats between now and his next visit in 2 weeks.  Plan: 1.  complete antibiotics today than stop and observe 2. Followup on May 8   [name], MD [facility] [phone] pager   [phone] cell 12/06/2012, 11:25 AM

## 2012-12-07 ENCOUNTER — Encounter (HOSPITAL_COMMUNITY): Payer: BC Managed Care – PPO

## 2012-12-10 ENCOUNTER — Ambulatory Visit (HOSPITAL_BASED_OUTPATIENT_CLINIC_OR_DEPARTMENT_OTHER): Payer: BC Managed Care – PPO

## 2012-12-10 ENCOUNTER — Other Ambulatory Visit: Payer: Self-pay | Admitting: Oncology

## 2012-12-10 VITALS — BP 120/68 | HR 64 | Temp 98.4°F | Resp 20

## 2012-12-10 DIAGNOSIS — Z5111 Encounter for antineoplastic chemotherapy: Secondary | ICD-10-CM

## 2012-12-10 DIAGNOSIS — C8593 Non-Hodgkin lymphoma, unspecified, intra-abdominal lymph nodes: Secondary | ICD-10-CM

## 2012-12-10 DIAGNOSIS — C8589 Other specified types of non-Hodgkin lymphoma, extranodal and solid organ sites: Secondary | ICD-10-CM

## 2012-12-10 DIAGNOSIS — Z5112 Encounter for antineoplastic immunotherapy: Secondary | ICD-10-CM

## 2012-12-10 MED ORDER — SODIUM CHLORIDE 0.9 % IJ SOLN
10.0000 mL | Freq: Once | INTRAMUSCULAR | Status: AC
  Administered 2012-12-10: 10 mL
  Filled 2012-12-10: qty 10

## 2012-12-10 MED ORDER — SODIUM CHLORIDE 0.9 % IV SOLN
375.0000 mg/m2 | Freq: Once | INTRAVENOUS | Status: AC
  Administered 2012-12-10: 800 mg via INTRAVENOUS
  Filled 2012-12-10: qty 80

## 2012-12-10 MED ORDER — PALONOSETRON HCL INJECTION 0.25 MG/5ML
0.2500 mg | Freq: Once | INTRAVENOUS | Status: AC
  Administered 2012-12-10: 0.25 mg via INTRAVENOUS

## 2012-12-10 MED ORDER — VINCRISTINE SULFATE CHEMO INJECTION 1 MG/ML
2.0000 mg | Freq: Once | INTRAVENOUS | Status: AC
  Administered 2012-12-10: 2 mg via INTRAVENOUS
  Filled 2012-12-10: qty 2

## 2012-12-10 MED ORDER — SODIUM CHLORIDE 0.9 % IV SOLN
150.0000 mg | Freq: Once | INTRAVENOUS | Status: AC
  Administered 2012-12-10: 150 mg via INTRAVENOUS
  Filled 2012-12-10: qty 5

## 2012-12-10 MED ORDER — DIPHENHYDRAMINE HCL 25 MG PO CAPS
50.0000 mg | ORAL_CAPSULE | Freq: Once | ORAL | Status: AC
  Administered 2012-12-10: 50 mg via ORAL

## 2012-12-10 MED ORDER — DEXAMETHASONE SODIUM PHOSPHATE 10 MG/ML IJ SOLN
10.0000 mg | Freq: Once | INTRAMUSCULAR | Status: AC
  Administered 2012-12-10: 10 mg via INTRAVENOUS

## 2012-12-10 MED ORDER — HEPARIN SOD (PORK) LOCK FLUSH 100 UNIT/ML IV SOLN
250.0000 [IU] | Freq: Once | INTRAVENOUS | Status: AC | PRN
  Administered 2012-12-10: 250 [IU]
  Filled 2012-12-10: qty 5

## 2012-12-10 MED ORDER — HEPARIN SOD (PORK) LOCK FLUSH 100 UNIT/ML IV SOLN
250.0000 [IU] | Freq: Once | INTRAVENOUS | Status: AC
  Administered 2012-12-10: 250 [IU] via INTRAVENOUS
  Filled 2012-12-10: qty 5

## 2012-12-10 MED ORDER — SODIUM CHLORIDE 0.9 % IJ SOLN
10.0000 mL | INTRAMUSCULAR | Status: DC | PRN
  Administered 2012-12-10: 10 mL
  Filled 2012-12-10: qty 10

## 2012-12-10 MED ORDER — SODIUM CHLORIDE 0.9 % IV SOLN
Freq: Once | INTRAVENOUS | Status: AC
  Administered 2012-12-10: 10:00:00 via INTRAVENOUS

## 2012-12-10 MED ORDER — ACETAMINOPHEN 325 MG PO TABS
650.0000 mg | ORAL_TABLET | Freq: Once | ORAL | Status: AC
  Administered 2012-12-10: 650 mg via ORAL

## 2012-12-10 MED ORDER — SODIUM CHLORIDE 0.9 % IV SOLN
750.0000 mg/m2 | Freq: Once | INTRAVENOUS | Status: AC
  Administered 2012-12-10: 1680 mg via INTRAVENOUS
  Filled 2012-12-10: qty 84

## 2012-12-10 MED ORDER — DOXORUBICIN HCL CHEMO IV INJECTION 2 MG/ML
50.0000 mg/m2 | Freq: Once | INTRAVENOUS | Status: AC
  Administered 2012-12-10: 112 mg via INTRAVENOUS
  Filled 2012-12-10: qty 56

## 2012-12-10 NOTE — Patient Instructions (Signed)
[facility] Discharge Instructions for Patients Receiving Chemotherapy  Today you received the following chemotherapy agents :  Rituxan,  Adriamycin,  Vincristine,  Cytoxan.  To help prevent nausea and vomiting after your treatment, we encourage you to take your nausea medication as instructed by your physician.    If you develop nausea and vomiting that is not controlled by your nausea medication, call the clinic. If it is after clinic hours your family physician or the after hours number for the clinic or go to the Emergency Department.   BELOW ARE SYMPTOMS THAT SHOULD BE REPORTED IMMEDIATELY:  *FEVER GREATER THAN 100.5 F  *CHILLS WITH OR WITHOUT FEVER  NAUSEA AND VOMITING THAT IS NOT CONTROLLED WITH YOUR NAUSEA MEDICATION  *UNUSUAL SHORTNESS OF BREATH  *UNUSUAL BRUISING OR BLEEDING  TENDERNESS IN MOUTH AND THROAT WITH OR WITHOUT PRESENCE OF ULCERS  *URINARY PROBLEMS  *BOWEL PROBLEMS  UNUSUAL RASH Items with * indicate a potential emergency and should be followed up as soon as possible.  One of the nurses will contact you 24 hours after your treatment. Please let the nurse know about any problems that you may have experienced. Feel free to call the clinic you have any questions or concerns. The clinic phone number is [phone].   I have been informed and understand all the instructions given to me. I know to contact the clinic, my physician, or go to the Emergency Department if any problems should occur. I do not have any questions at this time, but understand that I may call the clinic during office hours   should I have any questions or need assistance in obtaining follow up care.    __________________________________________  _____________  __________ Signature of Patient or Authorized Representative            Date                   Time    __________________________________________ Nurse's Signature

## 2012-12-10 NOTE — Progress Notes (Signed)
Pt saw Dr. Truett Perna 12/05/12 with labs.  No need to repeat labs today prior to chemo as per md. Right UA Double lumen PICC dressing changed per protocol.  Site slightly reddened but no drainage noted, no pain or tenderness to touch.  Biopatch applied to entry site.  Caps changed x2 .  Red port flushed per protocol with good blood return; capped and clamped.  Purple port flushed easily with 10cc NS with good blood return.  IVF up for chemo today. Pt tolerated procedure without problems.

## 2012-12-11 ENCOUNTER — Ambulatory Visit (HOSPITAL_BASED_OUTPATIENT_CLINIC_OR_DEPARTMENT_OTHER): Payer: BC Managed Care – PPO

## 2012-12-11 VITALS — BP 148/64 | HR 81 | Temp 98.4°F

## 2012-12-11 DIAGNOSIS — C8589 Other specified types of non-Hodgkin lymphoma, extranodal and solid organ sites: Secondary | ICD-10-CM

## 2012-12-11 DIAGNOSIS — Z5189 Encounter for other specified aftercare: Secondary | ICD-10-CM

## 2012-12-11 DIAGNOSIS — C8593 Non-Hodgkin lymphoma, unspecified, intra-abdominal lymph nodes: Secondary | ICD-10-CM

## 2012-12-11 MED ORDER — PEGFILGRASTIM INJECTION 6 MG/0.6ML
6.0000 mg | Freq: Once | SUBCUTANEOUS | Status: AC
  Administered 2012-12-11: 6 mg via SUBCUTANEOUS
  Filled 2012-12-11: qty 0.6

## 2012-12-17 ENCOUNTER — Encounter (HOSPITAL_COMMUNITY): Payer: Self-pay | Admitting: *Deleted

## 2012-12-17 ENCOUNTER — Inpatient Hospital Stay (HOSPITAL_COMMUNITY)
Admission: EM | Admit: 2012-12-17 | Discharge: 2012-12-20 | DRG: 398 | Disposition: A | Discharge status: Home / Self Care | Payer: BC Managed Care – PPO | Attending: Internal Medicine | Admitting: Internal Medicine

## 2012-12-17 ENCOUNTER — Emergency Department (HOSPITAL_COMMUNITY): Payer: BC Managed Care – PPO

## 2012-12-17 DIAGNOSIS — D709 Neutropenia, unspecified: Secondary | ICD-10-CM

## 2012-12-17 DIAGNOSIS — R5081 Fever presenting with conditions classified elsewhere: Secondary | ICD-10-CM | POA: Diagnosis present

## 2012-12-17 DIAGNOSIS — R197 Diarrhea, unspecified: Secondary | ICD-10-CM

## 2012-12-17 DIAGNOSIS — R55 Syncope and collapse: Secondary | ICD-10-CM

## 2012-12-17 DIAGNOSIS — T451X5A Adverse effect of antineoplastic and immunosuppressive drugs, initial encounter: Secondary | ICD-10-CM | POA: Diagnosis present

## 2012-12-17 DIAGNOSIS — I959 Hypotension, unspecified: Secondary | ICD-10-CM | POA: Diagnosis present

## 2012-12-17 DIAGNOSIS — C8589 Other specified types of non-Hodgkin lymphoma, extranodal and solid organ sites: Secondary | ICD-10-CM | POA: Diagnosis present

## 2012-12-17 DIAGNOSIS — Z79899 Other long term (current) drug therapy: Secondary | ICD-10-CM

## 2012-12-17 DIAGNOSIS — D702 Other drug-induced agranulocytosis: Principal | ICD-10-CM | POA: Diagnosis present

## 2012-12-17 DIAGNOSIS — R509 Fever, unspecified: Secondary | ICD-10-CM

## 2012-12-17 DIAGNOSIS — IMO0002 Reserved for concepts with insufficient information to code with codable children: Secondary | ICD-10-CM

## 2012-12-17 DIAGNOSIS — R61 Generalized hyperhidrosis: Secondary | ICD-10-CM | POA: Diagnosis present

## 2012-12-17 DIAGNOSIS — C8593 Non-Hodgkin lymphoma, unspecified, intra-abdominal lymph nodes: Secondary | ICD-10-CM

## 2012-12-17 DIAGNOSIS — E860 Dehydration: Secondary | ICD-10-CM | POA: Diagnosis present

## 2012-12-17 DIAGNOSIS — Z9221 Personal history of antineoplastic chemotherapy: Secondary | ICD-10-CM

## 2012-12-17 DIAGNOSIS — D649 Anemia, unspecified: Secondary | ICD-10-CM

## 2012-12-17 LAB — BASIC METABOLIC PANEL
CO2: 25 mEq/L (ref 19–32)
Calcium: 8.5 mg/dL (ref 8.4–10.5)
Chloride: 103 mEq/L (ref 96–112)
Glucose, Bld: 142 mg/dL — ABNORMAL HIGH (ref 70–99)
Sodium: 138 mEq/L (ref 135–145)

## 2012-12-17 LAB — URINALYSIS, ROUTINE W REFLEX MICROSCOPIC
Glucose, UA: NEGATIVE mg/dL
Hgb urine dipstick: NEGATIVE
Protein, ur: NEGATIVE mg/dL
Specific Gravity, Urine: 1.025 (ref 1.005–1.030)
pH: 5 (ref 5.0–8.0)

## 2012-12-17 LAB — CBC
Hemoglobin: 10.2 g/dL — ABNORMAL LOW (ref 13.0–17.0)
MCH: 27.7 pg (ref 26.0–34.0)
MCV: 84.2 fL (ref 78.0–100.0)
RBC: 3.68 MIL/uL — ABNORMAL LOW (ref 4.22–5.81)

## 2012-12-17 MED ORDER — VANCOMYCIN HCL IN DEXTROSE 1-5 GM/200ML-% IV SOLN
1000.0000 mg | Freq: Once | INTRAVENOUS | Status: AC
  Administered 2012-12-18: 1000 mg via INTRAVENOUS
  Filled 2012-12-17: qty 200

## 2012-12-17 MED ORDER — DEXTROSE 5 % IV SOLN
1.0000 g | Freq: Three times a day (TID) | INTRAVENOUS | Status: DC
  Administered 2012-12-18: 1 g via INTRAVENOUS
  Filled 2012-12-17 (×2): qty 1

## 2012-12-17 MED ORDER — SODIUM CHLORIDE 0.9 % IV BOLUS (SEPSIS)
1000.0000 mL | Freq: Once | INTRAVENOUS | Status: AC
  Administered 2012-12-17: 1000 mL via INTRAVENOUS

## 2012-12-17 NOTE — ED Notes (Signed)
Per Dr. Trina Ao (oncologist consult) via telephone, please draw one set of cultures from the patients picc line and the other from a peripheral

## 2012-12-17 NOTE — ED Notes (Signed)
Pt has an IV currently infusing and a PICC line in the other arm. The RN will discontinue the infusion and I will draw the blood cultures after 30 minutes.

## 2012-12-17 NOTE — ED Provider Notes (Addendum)
History     CSN: [id number]  Arrival date & time 12/17/12  2029   First MD Initiated Contact with Patient 12/17/12 2043      Chief Complaint  Patient presents with  . Loss of Consciousness    (Consider location/radiation/quality/duration/timing/severity/associated sxs/prior treatment) HPI Pt presenting with c/o syncope.  He has hx of nonhodgkins lymphoma and had last chemo approx 1 week ago.  He received neulasta after last chemo.  He had fever and syncope last time he received neulasta as well.  Was treated via PICC line for presumed port infection (port removed- no other source identified)- finished IV abx 12/06/12.  No fever.  Pt felt lightheaded and diaphoretic prior to event today.  No chest pain, no palpitations, no severe headache.  Has not had recent vomiting or diarrhea.  Pt feels fatigued at present but no other complaints.  There are no other associated systemic symptoms, there are no other alleviating or modifying factors.   Past Medical History  Diagnosis Date  . Cancer 2013    Non-Hodgkins Lymphoma  . Other malignant lymphomas, unspecified site, extranodal and solid organ sites 2013  . Hemorrhoid   . H/O hiatal hernia     small seen on CT Scan    Past Surgical History  Procedure Laterality Date  . Vasectomy    . Vasectomy  1998  . No past surgeries    . Laparoscopy  08/20/2012    Procedure: LAPAROSCOPY DIAGNOSTIC;  Surgeon: Ernestene Mention, MD;  Location: [facility] OR;  Service: General;  Laterality: N/A;  . Esophageal biopsy  08/20/2012    Procedure: BIOPSY;  Surgeon: Ernestene Mention, MD;  Location: [facility] OR;  Service: General;  Laterality: N/A;  Laparoscopic biopsy of abdominal mesenteric mass    Family History  Problem Relation Age of Onset  . Cancer Mother     Breast    History  Substance Use Topics  . Smoking status: Never Smoker   . Smokeless tobacco: Never Used  . Alcohol Use: Yes     Comment: on occassion      Review of Systems ROS reviewed and all  otherwise negative except for mentioned in HPI  Allergies  Review of patient's allergies indicates no known allergies.  Home Medications   Current Outpatient Rx  Name  Route  Sig  Dispense  Refill  . aspirin 325 MG tablet   Oral   Take 2 tablets (650 mg total) by mouth every 6 (six) hours as needed for pain or fever.         . baclofen (LIORESAL) 10 MG tablet   Oral   Take 5 mg by mouth 3 (three) times daily as needed (for hiccups).         . LORazepam (ATIVAN) 1 MG tablet   Sublingual   Place 1 tablet (1 mg total) under the tongue every 6 (six) hours as needed (nausea).   30 tablet   1   . predniSONE (DELTASONE) 20 MG tablet   Oral   Take 5 tablets (100 mg total) by mouth daily. X 5 days Begin on day 1 of each chemo cycle   50 tablet   2   . PRESCRIPTION MEDICATION   Intravenous   Inject into the vein every 21 ( twenty-one) days. CHOP infusion every 21 days.         . prochlorperazine (COMPAZINE) 10 MG tablet   Oral   Take 1 tablet (10 mg total) by mouth every 6 (six)  hours as needed (nausea).   60 tablet   2   . PROCTOZONE-HC 2.5 % rectal cream   Rectal   Place 1 application rectally 2 (two) times daily as needed for hemorrhoids.            BP 111/63  Pulse 112  Temp(Src) 98 F (36.7 C) (Oral)  Resp 16  Wt 225 lb (102.059 kg)  BMI 31.39 kg/m2  SpO2 95% Vitals reviewed Physical Exam Physical Examination: General appearance - alert, well appearing, and in no distress Mental status - alert, oriented to person, place, and time Eyes - no conjunctival injection, no scleral icterus Mouth - mucous membranes moist, pharynx normal without lesions Chest - clear to auscultation, no wheezes, rales or rhonchi, symmetric air entry Heart - normal rate, regular rhythm, normal S1, S2, no murmurs, rubs, clicks or gallops Abdomen - soft, nontender, nondistended, no masses or organomegaly Extremities - peripheral pulses normal, no pedal edema, no clubbing or  cyanosis, PICC site c/d/i, no drainage or erythema Skin - normal coloration and turgor, no rashes  ED Course  Procedures (including critical care time)  9:26 PM Dr. Trina Ao- hem/onc fellow from [facility] is at the bedside evaluating the patient.    10:59 PM d/w Dr. Trina Ao- recommends treating as neutropenic fever, gotten urine culture and blood culture.  D/w [facility] for admission - he will see patient in the ED   Date: 12/17/2012  Rate: 87  Rhythm: normal sinus rhythm  QRS Axis: normal  Intervals: normal  ST/T Wave abnormalities: normal  Conduction Disutrbances: none  Narrative Interpretation: unremarkable, no significant changes from 11/19/12     Labs Reviewed  CBC - Abnormal; Notable for the following:    WBC 0.6 (*)    RBC 3.68 (*)    Hemoglobin 10.2 (*)    HCT 31.0 (*)    Platelets 76 (*)    All other components within normal limits  BASIC METABOLIC PANEL - Abnormal; Notable for the following:    Glucose, Bld 142 (*)    GFR calc non Af Amer 67 (*)    GFR calc Af Amer 78 (*)    All other components within normal limits  CULTURE, BLOOD (ROUTINE X 2)  CULTURE, BLOOD (ROUTINE X 2)  URINE CULTURE  URINALYSIS, ROUTINE W REFLEX MICROSCOPIC   Dg Chest 2 View  12/17/2012  *RADIOLOGY REPORT*  Clinical Data: 57 year old male loss of consciousness.  History of lymphoma undergoing chemotherapy.  CHEST - 2 VIEW  Comparison: 11/15/2012.  Findings: Right chest Port-A-Cath removed.  Right side PICC line now in place.  Stable lung volumes.  Cardiac size and mediastinal contours are within normal limits.  Visualized tracheal air column is within normal limits.  No pneumothorax, pulmonary edema, pleural effusion or confluent pulmonary opacity. No acute osseous abnormality identified.  IMPRESSION: No acute cardiopulmonary abnormality.   Original Report Authenticated By: Erskine Speed, M.D.      1. Neutropenia   2. Syncope       MDM  Pt presenting with c/o syncope.  He has hx of NHL and is  currently receiving chemotherapy.  He is found to be neutropenic on today's evaluation.  He was seen in the ED by hem/onc fellow as above and advised admission due to neutropenia and hx c/w possible infection.  Cultures obtained.  Pt is well appearing.  Started on broad spectrum abx and d/w [facility] for admission.         Ethelda Chick, MD 12/17/12 [phone]  Ethelda Chick, MD 12/17/12 [phone]

## 2012-12-17 NOTE — Consult Note (Signed)
Subjective:   Reason for consult: neutropenia  HPI:  Adam Alvarado is a 57 year old male with non-Hodgkin's lymphoma s/p 5 cycles RCHOP who is being admitted after a syncopal episode now found to be neutropenic.  Patient reports that after dinner, he was in the bathroom having a bowel movement when he began to sweat and feel light-headed.  He called for help and family noted his appearance was ashen.  Paramedics were called and as they were helping him up, pt lost consciousness briefly.  No seizure like activity.  Per family report, EMS was unable to get a good blood pressure reading at home.  Pt was then transported to the ER.  Pt denies other fevers, chills, chest pain, sob, nausea/vomiting.  He felt slightly light-headed earlier today during a bowel movement as well.  He had a small amount of diarrhea before the paramedics came.  Is feeling asymptomatic in the ER.  This is similar to the events prior to a recent admission 4/3-4/15.  He was admitted with the same symptoms as well as nausea and vomiting and found to be neutropenic.  He also developed a fever.  A source was never found. Port was removed.  He went home on IV vancomycin and cefepime which finished on 12/06/12.  He received cycle 5 of RCHOP on 12/10/12 with Neulasta support.   Patient Active Problem List   Diagnosis Date Noted  . Fever 11/21/2012  . Anemia 11/21/2012  . Nausea with vomiting 11/21/2012  . Diarrhea 11/21/2012  . Antineoplastic chemotherapy induced pancytopenia 11/21/2012  . Syncope 11/16/2012  . Hyponatremia 11/16/2012  . Non-Hodgkin lymphoma of intra-abdominal lymph nodes 09/02/2012   Past Medical History  Diagnosis Date  . Cancer 2013    Non-Hodgkins Lymphoma  . Other malignant lymphomas, unspecified site, extranodal and solid organ sites 2013  . Hemorrhoid   . H/O hiatal hernia     small seen on CT Scan    Past Surgical History  Procedure Laterality Date  . Vasectomy    . Vasectomy  1998  . No past surgeries     . Laparoscopy  08/20/2012    Procedure: LAPAROSCOPY DIAGNOSTIC;  Surgeon: Ernestene Mention, MD;  Location: [facility] OR;  Service: General;  Laterality: N/A;  . Esophageal biopsy  08/20/2012    Procedure: BIOPSY;  Surgeon: Ernestene Mention, MD;  Location: [facility] OR;  Service: General;  Laterality: N/A;  Laparoscopic biopsy of abdominal mesenteric mass     (Not in a hospital admission) No Known Allergies  History  Substance Use Topics  . Smoking status: Never Smoker   . Smokeless tobacco: Never Used  . Alcohol Use: Yes     Comment: on occassion    Family History  Problem Relation Age of Onset  . Cancer Mother     Breast    Review of Systems A comprehensive ROS was negative except as mentioned in the HPI.  Objective:   Patient Vitals for the past 8 hrs:  BP Temp Temp src Pulse Resp SpO2 Weight  12/17/12 2155 111/63 mmHg - - 112 - - -  12/17/12 2148 122/82 mmHg - - 94 - - -  12/17/12 2145 130/68 mmHg - - 88 - - -  12/17/12 2038 126/65 mmHg 98 F (36.7 C) Oral 88 16 95 % 225 lb (102.059 kg)     Total I/O In: -  Out: 250 [Urine:250]  Gen: no acute distress HEENT: anicteric sclerae, MMM, no thrush CV: RRR no m/r/g Pulm:  easy WOB, CTAB Abd: s/nt/nd Ext: no LE edema, + LUE PICC  Data Review: reviewed and significant for WBC 0.6  Assessment:   Adam Alvarado is a 21 yom with non-Hodgkin's lymphoma s/p RCHOP cycle 5 with Neulasta support presenting with syncope and neutropenia.  He had similar symptoms after cycle 4 and was admitted 4/3-4/15 with associated fevers.  Although he is afebrile at this time, I believe in the light of his repeat syncopal and pre-syncopal symptoms in the setting of neutropenia, this should be treated as a neutropenic fever equivalent.  Plan:   1. Admit to hospital medicine 2. Urine culture and blood cultures (from periphery and PICC) 3. IV antibiotics- agree with vancomycin and cefepime as he recovered with this regimen during his last admission 4. IV fluids as  appropriate 5. Once reliable access is achieved and cultures are drawn, PICC line should likely be removed  Will continue to follow.  Zeb Comfort, MD

## 2012-12-17 NOTE — ED Notes (Signed)
Family witnessed syncopal episode (could not report timing). Pt was going to the bathroom and had syncopal episode. On arrival by ems, pt responding verbally, confused, diaphoretic, pale. History of Hodgkins (last chemo on Monday). Initial BP (60's palpated). 500 NS bolus given, repeat BP 133/76. Pt reports feeling better, just weak. Denies pain. IV established. PICC line right arm.

## 2012-12-17 NOTE — ED Notes (Signed)
[id number]<GN> Expected date:<BR> Expected time:<BR> Means of arrival:<BR> Comments:<BR> EMS, 51yM syncope

## 2012-12-17 NOTE — ED Notes (Signed)
Pt says he was sitting on the toilet and became more and more lightheaded and passed out. Pt denies pain at this time

## 2012-12-17 NOTE — H&P (Signed)
History and Physical  LAMONTA Alvarado GEX:[id number] DOB: 13-Mar-1956 DOA: 12/17/2012  Referring physician: Dr Adam Alvarado PCP: Adam Perches, MD   Chief Complaint: Syncope  HPI:  Adam Alvarado is a 57 y.o. male, with past medical history significant for lymphoma diagnosed last Thanksgiving with most recent chemotherapy 1 week ago presenting today with an episode of fall. Patient was sitting on the commode when he suddenly felt dizzy but did not fall or lose consciousness. Patient was sweating profusely while being dizzy and hence ended up calling EMS. Patient had another episode of dizziness where he lost consciousness and helped by 6 EMS personel to get to the EMS vehicle. His BP was in 60's and EMS personnel were unable to find his pulse. Patient's BP was normal on arrival in ER. He denies any fevers, chills, shortness of breath, chest pain, difficulty urination or weakness or numbness anywhere in the body. Patient's WBC count was 0.6. Oncology was consulted by the emergency room doctor and advised treating him as if febrile neutropenia.  Patient will be admitted to telemetry for management of neutropenia with oncology on board as consultants.   Review of Systems:  15 point review of system was negative except as noted above.   Past Medical History  Diagnosis Date  . Cancer 2013    Non-Hodgkins Lymphoma  . Other malignant lymphomas, unspecified site, extranodal and solid organ sites 2013  . Hemorrhoid   . H/O hiatal hernia     small seen on CT Scan    Past Surgical History  Procedure Laterality Date  . Vasectomy    . Vasectomy  1998  . No past surgeries    . Laparoscopy  08/20/2012    Procedure: LAPAROSCOPY DIAGNOSTIC;  Surgeon: Ernestene Mention, MD;  Location: [facility] OR;  Service: General;  Laterality: N/A;  . Esophageal biopsy  08/20/2012    Procedure: BIOPSY;  Surgeon: Ernestene Mention, MD;  Location: [facility] OR;  Service: General;  Laterality: N/A;  Laparoscopic biopsy of abdominal mesenteric mass     Social History:  reports that he has never smoked. He has never used smokeless tobacco. He reports that  drinks alcohol. He reports that he does not use illicit drugs.  No Known Allergies  Family History  Problem Relation Age of Onset  . Cancer Mother     Breast     Prior to Admission medications   Medication Sig Start Date End Date Taking? Authorizing Provider  aspirin 325 MG tablet Take 2 tablets (650 mg total) by mouth every 6 (six) hours as needed for pain or fever. 11/27/12  Yes Adam Bong, MD  baclofen (LIORESAL) 10 MG tablet Take 5 mg by mouth 3 (three) times daily as needed (for hiccups).   Yes Historical Provider, MD  LORazepam (ATIVAN) 1 MG tablet Place 1 tablet (1 mg total) under the tongue every 6 (six) hours as needed (nausea). 09/27/12  Yes Ladene Artist, MD  predniSONE (DELTASONE) 20 MG tablet Take 5 tablets (100 mg total) by mouth daily. X 5 days Begin on day 1 of each chemo cycle 08/31/12  Yes Ladene Artist, MD  PRESCRIPTION MEDICATION Inject into the vein every 21 ( twenty-one) days. CHOP infusion every 21 days.   Yes Historical Provider, MD  prochlorperazine (COMPAZINE) 10 MG tablet Take 1 tablet (10 mg total) by mouth every 6 (six) hours as needed (nausea). 08/31/12  Yes Ladene Artist, MD  PROCTOZONE-HC 2.5 % rectal cream Place 1 application rectally 2 (two)  times daily as needed for hemorrhoids.  09/20/12  Yes Historical Provider, MD   Physical Exam: Filed Vitals:   12/17/12 2038 12/17/12 2145 12/17/12 2148 12/17/12 2155  BP: 126/65 130/68 122/82 111/63  Pulse: 88 88 94 112  Temp: 98 F (36.7 C)     TempSrc: Oral     Resp: 16     Weight: 225 lb (102.059 kg)     SpO2: 95%      Physical Exam: General: Vital signs reviewed and noted. Well-developed, well-nourished, in no acute distress; alert, appropriate and cooperative throughout examination.  Head: Normocephalic, atraumatic.  Eyes: PERRL, EOMI, No signs of anemia or jaundince.  Nose: Mucous  membranes moist, not inflammed, nonerythematous.  Throat: Oropharynx nonerythematous, no exudate appreciated.   Neck: No deformities, masses, or tenderness noted.Supple, No carotid Bruits, no JVD.  Lungs:  Normal respiratory effort. Clear to auscultation BL without crackles or wheezes.  Heart: RRR. S1 and S2 normal without gallop, murmur, or rubs.  Abdomen:  BS normoactive. Soft, Nondistended, non-tender.  No masses or organomegaly.  Extremities: No pretibial edema.PICC line in place on right arm and does not look tender/red  Neurologic: A&O X3, CN II - XII are grossly intact. Motor strength is 5/5 in the all 4 extremities, Sensations intact to light touch, Cerebellar signs negative.  Skin: No visible rashes, scars.    Wt Readings from Last 3 Encounters:  12/17/12 225 lb (102.059 kg)  12/05/12 219 lb 6.4 oz (99.519 kg)  12/04/12 220 lb 4 oz (99.905 kg)   Labs on Admission:  Basic Metabolic Panel:  Recent Labs Lab 12/17/12 2039  NA 138  K 4.3  CL 103  CO2 25  GLUCOSE 142*  BUN 17  CREATININE 1.18  CALCIUM 8.5    CBC:  Recent Labs Lab 12/17/12 2039  WBC 0.6*  HGB 10.2*  HCT 31.0*  MCV 84.2  PLT 76*    Radiological Exams on Admission: Dg Chest 2 View  12/17/2012  *RADIOLOGY REPORT*  Clinical Data: 57 year old male loss of consciousness.  History of lymphoma undergoing chemotherapy.  CHEST - 2 VIEW  Comparison: 11/15/2012.  Findings: Right chest Port-A-Cath removed.  Right side PICC line now in place.  Stable lung volumes.  Cardiac size and mediastinal contours are within normal limits.  Visualized tracheal air column is within normal limits.  No pneumothorax, pulmonary edema, pleural effusion or confluent pulmonary opacity. No acute osseous abnormality identified.  IMPRESSION: No acute cardiopulmonary abnormality.   Original Report Authenticated By: Adam Alvarado, M.D.     EKG: Independently reviewed. 87 bpm, sinus rhythm, normal axis, no ST or T wave changes   Principal  Problem:   Neutropenia Active Problems:   Non-Hodgkin lymphoma of intra-abdominal lymph nodes   Syncope   Fever   Assessment/Plan Patient is a 57 year old man with past medical history most significant for non-Hodgkin's lymphoma who is currently on treatment with last chemotherapy one week ago now presenting with episode of syncope and found to have neutropenia with WBC count of 600. Oncology was consulted by the ER physician who has suggested to start the treatment for neutropenia.  -Admit the patient to telemetry bed -Neutropenic precautions -Blood cultures x2 from PICC line and from peripheral - DC PICC line after blood cultures -Vancomycin and cefepime as per up-to-date for treatment of neutropenic fevers(vancomycin was added as patient has PICC line in place and presented with hypotension) -Continue to monitor WBC count daily -IV fluids normal saline 100 cc an hour -  consider ID consult in AM  Continue home medications.  Code Status: Full code Family Communication: Updated at bedside Disposition Plan/Anticipated LOS: Guarded  Time spent: 70 minutes  Lars Mage, MD  [facility] Team 5  If 7PM-7AM, please contact night-coverage at [email or web address], password [facility] 12/17/2012, 11:38 PM

## 2012-12-18 DIAGNOSIS — R55 Syncope and collapse: Secondary | ICD-10-CM

## 2012-12-18 DIAGNOSIS — R5081 Fever presenting with conditions classified elsewhere: Secondary | ICD-10-CM

## 2012-12-18 DIAGNOSIS — D649 Anemia, unspecified: Secondary | ICD-10-CM

## 2012-12-18 DIAGNOSIS — R509 Fever, unspecified: Secondary | ICD-10-CM

## 2012-12-18 DIAGNOSIS — D61818 Other pancytopenia: Secondary | ICD-10-CM

## 2012-12-18 DIAGNOSIS — R197 Diarrhea, unspecified: Secondary | ICD-10-CM

## 2012-12-18 DIAGNOSIS — C8589 Other specified types of non-Hodgkin lymphoma, extranodal and solid organ sites: Secondary | ICD-10-CM

## 2012-12-18 DIAGNOSIS — L539 Erythematous condition, unspecified: Secondary | ICD-10-CM

## 2012-12-18 LAB — CBC WITH DIFFERENTIAL/PLATELET
Basophils Relative: 2 % — ABNORMAL HIGH (ref 0–1)
Eosinophils Relative: 19 % — ABNORMAL HIGH (ref 0–5)
Hemoglobin: 9 g/dL — ABNORMAL LOW (ref 13.0–17.0)
Lymphocytes Relative: 51 % — ABNORMAL HIGH (ref 12–46)
Neutrophils Relative %: 12 % — ABNORMAL LOW (ref 43–77)
Platelets: 74 10*3/uL — ABNORMAL LOW (ref 150–400)
RBC: 3.32 MIL/uL — ABNORMAL LOW (ref 4.22–5.81)
WBC: 0.6 10*3/uL — CL (ref 4.0–10.5)

## 2012-12-18 MED ORDER — DEXTROSE 5 % IV SOLN
2.0000 g | Freq: Three times a day (TID) | INTRAVENOUS | Status: DC
  Administered 2012-12-18 – 2012-12-20 (×8): 2 g via INTRAVENOUS
  Filled 2012-12-18 (×9): qty 2

## 2012-12-18 MED ORDER — PROCHLORPERAZINE MALEATE 10 MG PO TABS
10.0000 mg | ORAL_TABLET | Freq: Four times a day (QID) | ORAL | Status: DC | PRN
  Filled 2012-12-18: qty 1

## 2012-12-18 MED ORDER — LORAZEPAM 1 MG PO TABS: 1.0000 mg | ORAL_TABLET | Freq: Four times a day (QID) | ORAL | Status: DC | PRN

## 2012-12-18 MED ORDER — SODIUM CHLORIDE 0.9 % IJ SOLN
3.0000 mL | Freq: Two times a day (BID) | INTRAMUSCULAR | Status: DC
  Administered 2012-12-18: 3 mL via INTRAVENOUS

## 2012-12-18 MED ORDER — VANCOMYCIN HCL IN DEXTROSE 1-5 GM/200ML-% IV SOLN
1000.0000 mg | Freq: Two times a day (BID) | INTRAVENOUS | Status: DC
  Administered 2012-12-18: 1000 mg via INTRAVENOUS
  Filled 2012-12-18 (×2): qty 200

## 2012-12-18 MED ORDER — ENOXAPARIN SODIUM 40 MG/0.4ML ~~LOC~~ SOLN
40.0000 mg | SUBCUTANEOUS | Status: DC
  Administered 2012-12-18 – 2012-12-20 (×3): 40 mg via SUBCUTANEOUS
  Filled 2012-12-18 (×3): qty 0.4

## 2012-12-18 MED ORDER — BACLOFEN 5 MG HALF TABLET
5.0000 mg | ORAL_TABLET | Freq: Three times a day (TID) | ORAL | Status: DC | PRN
  Filled 2012-12-18: qty 1

## 2012-12-18 MED ORDER — SODIUM CHLORIDE 0.9 % IJ SOLN
10.0000 mL | INTRAMUSCULAR | Status: DC | PRN
  Administered 2012-12-18: 10 mL

## 2012-12-18 MED ORDER — ASPIRIN 325 MG PO TABS
650.0000 mg | ORAL_TABLET | Freq: Four times a day (QID) | ORAL | Status: DC | PRN
  Administered 2012-12-18: 650 mg via ORAL
  Filled 2012-12-18: qty 2

## 2012-12-18 MED ORDER — HYDROCORTISONE 2.5 % RE CREA
1.0000 "application " | TOPICAL_CREAM | Freq: Two times a day (BID) | RECTAL | Status: DC | PRN
  Filled 2012-12-18 (×2): qty 28.35

## 2012-12-18 MED ORDER — VANCOMYCIN HCL 10 G IV SOLR
1500.0000 mg | Freq: Two times a day (BID) | INTRAVENOUS | Status: DC
  Administered 2012-12-18 – 2012-12-20 (×4): 1500 mg via INTRAVENOUS
  Filled 2012-12-18 (×5): qty 1500

## 2012-12-18 MED ORDER — SODIUM CHLORIDE 0.9 % IV SOLN
INTRAVENOUS | Status: DC
  Administered 2012-12-18 – 2012-12-20 (×5): via INTRAVENOUS

## 2012-12-18 MED ORDER — PREDNISONE 50 MG PO TABS: 100.0000 mg | ORAL_TABLET | Freq: Every day | ORAL | Status: DC

## 2012-12-18 NOTE — Progress Notes (Signed)
PICC Line discontinued as ordered. Line intact at 40 cm. Manual pressure held. No active bleeding noted. Vaseline gauze pressure dressing applied and secured. Patient instructed to remain in bed x 30 minutes and to keep dressing dry and intact x 24 hours. Understanding verbalized.

## 2012-12-18 NOTE — Progress Notes (Signed)
ANTIBIOTIC CONSULT NOTE - INITIAL  Pharmacy Consult for vancomycin/cefepime Indication: Febrile neutropenia  No Known Allergies  Patient Measurements: Height: 5\' 11"  (180.3 cm) Weight: 222 lb 3.6 oz (100.8 kg) IBW/kg (Calculated) : 75.3 Adjusted Body Weight:   Vital Signs: Temp: 98.4 F (36.9 C) (05/06 0248) Temp src: Oral (05/06 0248) BP: 139/68 mmHg (05/06 0248) Pulse Rate: 81 (05/06 0248) Intake/Output from previous day: 05/05 0701 - 05/06 0700 In: 1200 [I.V.:1200] Out: 250 [Urine:250] Intake/Output from this shift: Total I/O In: 1200 [I.V.:1200] Out: 250 [Urine:250]  Labs:  Recent Labs  12/17/12 2039  WBC 0.6*  HGB 10.2*  PLT 76*  CREATININE 1.18   Estimated Creatinine Clearance: 84.5 ml/min (by C-G formula based on Cr of 1.18). No results found for this basename: VANCOTROUGH, Leodis Binet, VANCORANDOM, GENTTROUGH, GENTPEAK, GENTRANDOM, TOBRATROUGH, TOBRAPEAK, TOBRARND, AMIKACINPEAK, AMIKACINTROU, AMIKACIN,  in the last 72 hours   Microbiology: Recent Results (from the past 720 hour(s))  CLOSTRIDIUM DIFFICILE BY PCR     Status: None   Collection Time    11/18/12  7:49 PM      Result Value Range Status   C difficile by pcr NEGATIVE  NEGATIVE Final  CULTURE, BLOOD (ROUTINE X 2)     Status: None   Collection Time    11/20/12  8:20 PM      Result Value Range Status   Specimen Description BLOOD RIGHT HAND   Final   Special Requests BOTTLES DRAWN AEROBIC AND ANAEROBIC 10CC   Final   Culture  Setup Time 11/21/2012 01:53   Final   Culture NO GROWTH 5 DAYS   Final   Report Status 11/27/2012 FINAL   Final  CULTURE, BLOOD (ROUTINE X 2)     Status: None   Collection Time    11/20/12  8:24 PM      Result Value Range Status   Specimen Description BLOOD PORTA CATH   Final   Special Requests BOTTLES DRAWN AEROBIC AND ANAEROBIC 10CC   Final   Culture  Setup Time 11/21/2012 01:53   Final   Culture NO GROWTH 5 DAYS   Final   Report Status 11/27/2012 FINAL   Final   URINE CULTURE     Status: None   Collection Time    11/21/12 10:55 AM      Result Value Range Status   Specimen Description URINE, RANDOM   Final   Special Requests NONE   Final   Culture  Setup Time 11/21/2012 14:09   Final   Colony Count NO GROWTH   Final   Culture NO GROWTH   Final   Report Status 11/22/2012 FINAL   Final  CULTURE, BLOOD (ROUTINE X 2)     Status: None   Collection Time    11/22/12  8:30 AM      Result Value Range Status   Specimen Description BLOOD PORT   Final   Special Requests BOTTLES DRAWN AEROBIC AND ANAEROBIC 10CC   Final   Culture  Setup Time 11/22/2012 13:23   Final   Culture NO GROWTH 5 DAYS   Final   Report Status 11/28/2012 FINAL   Final  CULTURE, BLOOD (ROUTINE X 2)     Status: None   Collection Time    11/22/12  9:46 AM      Result Value Range Status   Specimen Description BLOOD LEFT ARM   Final   Special Requests BOTTLES DRAWN AEROBIC AND ANAEROBIC 5CC   Final   Culture  Setup Time  11/22/2012 13:23   Final   Culture NO GROWTH 5 DAYS   Final   Report Status 11/28/2012 FINAL   Final  MRSA PCR SCREENING     Status: None   Collection Time    11/22/12  7:57 PM      Result Value Range Status   MRSA by PCR NEGATIVE  NEGATIVE Final   Comment:            The GeneXpert MRSA Assay (FDA     approved for NASAL specimens     only), is one component of a     comprehensive MRSA colonization     surveillance program. It is not     intended to diagnose MRSA     infection nor to guide or     monitor treatment for     MRSA infections.  CATH TIP CULTURE     Status: None   Collection Time    11/26/12 12:54 PM      Result Value Range Status   Specimen Description CATH TIP   Final   Special Requests NONE   Final   Culture NO GROWTH 2 DAYS   Final   Report Status 11/29/2012 FINAL   Final    Medical History: Past Medical History  Diagnosis Date  . Cancer 2013    Non-Hodgkins Lymphoma  . Other malignant lymphomas, unspecified site, extranodal and  solid organ sites 2013  . Hemorrhoid   . H/O hiatal hernia     small seen on CT Scan    Medications:  Anti-infectives   Start     Dose/Rate Route Frequency Ordered Stop   12/18/12 0800  vancomycin (VANCOCIN) IVPB 1000 mg/200 mL premix     1,000 mg 200 mL/hr over 60 Minutes Intravenous Every 12 hours 12/18/12 0315     12/18/12 0600  ceFEPIme (MAXIPIME) 2 g in dextrose 5 % 50 mL IVPB     2 g 100 mL/hr over 30 Minutes Intravenous 3 times per day 12/18/12 0315     12/17/12 2300  ceFEPIme (MAXIPIME) 1 g in dextrose 5 % 50 mL IVPB  Status:  Discontinued     1 g 100 mL/hr over 30 Minutes Intravenous 3 times per day 12/17/12 2245 12/18/12 0315   12/17/12 2245  vancomycin (VANCOCIN) IVPB 1000 mg/200 mL premix     1,000 mg 200 mL/hr over 60 Minutes Intravenous  Once 12/17/12 2241 12/18/12 0112     Assessment: Patient with febrile neutropenia.  First dose of antibiotics already given in ED.  Goal of Therapy:  Vancomycin trough level 15-20 mcg/ml Cefepime dosed based on patient weight and renal function  Plan:  Measure antibiotic drug levels at steady state Follow up culture results Vancomycin 1gm iv q12hr Cefepime 2gm iv q8hr  Adam Alvarado, Adam Alvarado 12/18/2012,3:16 AM

## 2012-12-18 NOTE — Progress Notes (Signed)
[facility] PROGRESS NOTE  Adam Alvarado NFA:[id number] DOB: [birth year] DOA: 12/17/2012 PCP: [name], MD  Assessment/Plan: Neutropenia  -with low grade temp to 100.3 today -continue empiric abx with Vanc and cefepime pending cultures -appreciate onc assistance, awaiting ID eval and recs -pt states he received neulasta but that it has not been helping with his wbc Active Problems:  Syncope  -possibly vasovagal -obtain echo to further eval Non-Hodgkin lymphoma of intra-abdominal lymph nodes  -per onc     Code Status: full Family Communication: wife at bedside Disposition Plan: to home when medically ready   Consultants:  onc  ID  Procedures:  Echo - pending  Antibiotics:  Vanc and Cefepime started 5/5  HPI/Subjective: Denies cough, diarrhea and no dysuria. Also no CP, Dizziness  Objective: Filed Vitals:   12/18/12 0148 12/18/12 0248 12/18/12 0530 12/18/12 1030  BP: 135/66 139/68 151/83   Pulse: 84 81 84   Temp:  98.4 F (36.9 C) 99.3 F (37.4 C) 100.3 F (37.9 C)  TempSrc:  Oral Oral Oral  Resp: 18 18 18    Height:  5\' 11"  (1.803 m)    Weight:  100.8 kg (222 lb 3.6 oz)    SpO2: 99% 100% 95%     Intake/Output Summary (Last 24 hours) at 12/18/12 1124 Last data filed at 12/18/12 0900  Gross per 24 hour  Intake   1440 ml  Output    450 ml  Net    990 ml   Filed Weights   12/17/12 2038 12/18/12 0248  Weight: 102.059 kg (225 lb) 100.8 kg (222 lb 3.6 oz)    Exam:   General: alert and orientedx3  Cardiovascular: RRR, nl s1s2  Respiratory: CTAB  Abdomen: Soft+BS, NT/ND  EXtremities: no cyanosis and no edema  Data Reviewed: Basic Metabolic Panel:  Recent Labs Lab 12/17/12 2039  NA 138  K 4.3  CL 103  CO2 25  GLUCOSE 142*  BUN 17  CREATININE 1.18  CALCIUM 8.5   Liver Function Tests: No results found for this basename: AST, ALT, ALKPHOS, BILITOT, PROT, ALBUMIN,  in the last 168 hours No results found for this basename:  LIPASE, AMYLASE,  in the last 168 hours No results found for this basename: AMMONIA,  in the last 168 hours CBC:  Recent Labs Lab 12/17/12 2039 12/18/12 0508  WBC 0.6* 0.6*  NEUTROABS  --  0.1*  HGB 10.2* 9.0*  HCT 31.0* 28.0*  MCV 84.2 84.3  PLT 76* 74*   Cardiac Enzymes: No results found for this basename: CKTOTAL, CKMB, CKMBINDEX, TROPONINI,  in the last 168 hours BNP (last 3 results) No results found for this basename: PROBNP,  in the last 8760 hours CBG: No results found for this basename: GLUCAP,  in the last 168 hours  Recent Results (from the past 240 hour(s))  MRSA PCR SCREENING     Status: None   Collection Time    12/18/12  8:35 AM      Result Value Range Status   MRSA by PCR NEGATIVE  NEGATIVE Final   Comment:            The GeneXpert MRSA Assay (FDA     approved for NASAL specimens     only), is one component of a     comprehensive MRSA colonization     surveillance program. It is not     intended to diagnose MRSA     infection nor to guide or     monitor treatment  for     MRSA infections.     Studies: Dg Chest 2 View  12/17/2012  *RADIOLOGY REPORT*  Clinical Data: 57 year old male loss of consciousness.  History of lymphoma undergoing chemotherapy.  CHEST - 2 VIEW  Comparison: 11/15/2012.  Findings: Right chest Port-A-Cath removed.  Right side PICC line now in place.  Stable lung volumes.  Cardiac size and mediastinal contours are within normal limits.  Visualized tracheal air column is within normal limits.  No pneumothorax, pulmonary edema, pleural effusion or confluent pulmonary opacity. No acute osseous abnormality identified.  IMPRESSION: No acute cardiopulmonary abnormality.   Original Report Authenticated By: Erskine Speed, M.D.     Scheduled Meds: . ceFEPime (MAXIPIME) IV  2 g Intravenous Q8H  . enoxaparin (LOVENOX) injection  40 mg Subcutaneous Q24H  . sodium chloride  3 mL Intravenous Q12H  . vancomycin  1,000 mg Intravenous Q12H   Continuous  Infusions: . sodium chloride 100 mL/hr at 12/18/12 0415    Principal Problem:   Neutropenia Active Problems:   Non-Hodgkin lymphoma of intra-abdominal lymph nodes   Syncope   Fever    Time spent: 35    [facility] C  [facility] Pager [phone]. If 7PM-7AM, please contact night-coverage at [email or web address], password [email or web address] 12/18/2012, 11:24 AM  LOS: 1 day

## 2012-12-18 NOTE — Care Management Note (Addendum)
    Page 1 of 1   12/20/2012     5:56:20 PM   CARE MANAGEMENT NOTE 12/20/2012  Patient:  Adam Alvarado, Adam Alvarado   Account Number:  [id number]  Date Initiated:  12/18/2012  Documentation initiated by:  Lanier Clam  Subjective/Objective Assessment:   ADMITTED W/FALL,SYNCOPE,NEUTROPENIA.HX:NHL.READMIT-4/3-4/15.     Action/Plan:   FROM HOME W/SPOUSE.ACTIVE W/[facility]-PICC DSG CHANGES.HAS PCP,PHARMACY.   Anticipated DC Date:  12/20/2012   Anticipated DC Plan:  HOME/SELF CARE      DC Planning Services  CM consult      Choice offered to / List presented to:             Status of service:  Completed, signed off Medicare Important Message given?   (If response is "NO", the following Medicare IM given date fields will be blank) Date Medicare IM given:   Date Additional Medicare IM given:    Discharge Disposition:  HOME/SELF CARE  Per UR Regulation:  Reviewed for med. necessity/level of care/duration of stay  If discussed at Long Length of Stay Meetings, dates discussed:    Comments:  12/20/12 Aracelys Glade RN,BSN NCM [phone] NO D/C NEEDS OR ORDERS.  12/18/12 Donoven Pett RN,BSN NCM [phone] PICC D/C. IV ABX.[facility] KRISTEN AWARE OF ADMISSION, & FOLLOWING IF HH NEEDED.

## 2012-12-18 NOTE — Consult Note (Signed)
[facility]     Reason for Consult: syncope, concern for infection    Referring Physician: Dr. Suanne Marker  Principal Problem:   Neutropenia Active Problems:   Non-Hodgkin lymphoma of intra-abdominal lymph nodes   Syncope   Fever   . ceFEPime (MAXIPIME) IV  2 g Intravenous Q8H  . enoxaparin (LOVENOX) injection  40 mg Subcutaneous Q24H  . sodium chloride  3 mL Intravenous Q12H  . vancomycin  1,500 mg Intravenous Q12H    Recommendations: Continue vancomycin and cefepime for now, if remains afebrile, can d/c Consider not placing line until chemo is due at the end of the month, if possible   Assessment: He had a syncope episode and sounds vaso-vagal.  No positive cultures and highest temp is 100.3.  Weak overall but had chemo last Monday.  Does not at this time seem to have any infection.     Antibiotics: Vancomycin and cefepime day 2  HPI: Adam Alvarado is a 57 y.o. male who completed his fifth cycle of CHOP-rituximab for B-cell non-Hodgkin's lymphoma last week. He was eating dinner and went upstair to use the bathroom and began to feel dizzy while defecating and was able to call out for help.  His wife and sister helped and he completely lost consciousness and EMS was called.  He diffusely sweat, soaking his closes.  He was recently discharged for a febrile episode and had a port a cath removed, but no source ever identified.  He completed 2 weeks with vancomycin and cefepime and was doing well enough to get his fifth cycle of chemotherapy.  He though has felt more weak since the chemo and overall with general malaise.  No fever, no chills, some diarrhea but is not new or worse, no rashes. Had picc line removed today.      Review of Systems: Pertinent items are noted in HPI.  Past Medical History  Diagnosis Date  . Cancer 2013    Non-Hodgkins Lymphoma  . Other malignant lymphomas, unspecified site, extranodal and solid organ sites 2013  .  Hemorrhoid   . H/O hiatal hernia     small seen on CT Scan    History  Substance Use Topics  . Smoking status: Never Smoker   . Smokeless tobacco: Never Used  . Alcohol Use: Yes     Comment: on occassion    Family History  Problem Relation Age of Onset  . Cancer Mother     Breast   No Known Allergies  OBJECTIVE: Blood pressure 160/66, pulse 92, temperature 100 F (37.8 C), temperature source Oral, resp. rate 28, height 5\' 11"  (1.803 m), weight 222 lb 3.6 oz (100.8 kg), SpO2 98.00%. General: Awake, alert, oriented x 3 Skin: no rashes Lungs: CTA B Cor: RRR without m/r/g Abdomen: soft, nt, nd, +bs, no HSM Ext: no edema Lymph: no lad HEENT: anicteric, no petechial hemorrhages  Microbiology: Recent Results (from the past 240 hour(s))  MRSA PCR SCREENING     Status: None   Collection Time    12/18/12  8:35 AM      Result Value Range Status   MRSA by PCR NEGATIVE  NEGATIVE Final   Comment:            The GeneXpert MRSA Assay (FDA     approved for NASAL specimens     only), is one component of a     comprehensive MRSA colonization     surveillance program. It is not  intended to diagnose MRSA     infection nor to guide or     monitor treatment for     MRSA infections.    Staci Righter, MD [facility] for Infectious Disease [facility] [phone] pager  [phone] cell 12/18/2012, 3:28 PM

## 2012-12-18 NOTE — Progress Notes (Signed)
IP PROGRESS NOTE  Subjective:   He complete cycle 5 of chemotherapy on 12/10/2012. He received Neulasta on 12/11/2012. He reports tolerating the chemotherapy well. No fever or sweats. No pain related to Neulasta.  He felt slightly "weak "during the afternoon on 12/17/2012. After dinner on 12/17/2012 he was on the toilet and had a syncope of hit. He was able to call out to his wife prior to passing out. EMS was called and he was found to be hypotensive. He was brought to the emergency room.  Mr. Strole and his wife report it took a while for his mental status returned to baseline. He now feels well.  Objective: Vital signs in last 24 hours: Blood pressure 160/66, pulse 92, temperature 100.9 F (38.3 C), temperature source Oral, resp. rate 28, height 5\' 11"  (1.803 m), weight 222 lb 3.6 oz (100.8 kg), SpO2 98.00%.  Intake/Output from previous day: 05/05 0701 - 05/06 0700 In: 1200 [I.V.:1200] Out: 450 [Urine:450]  Physical Exam:  HEENT: No thrush or ulcers Lungs: Clear bilaterally Cardiac: Regular rate and rhythm Abdomen: Nontender, no hepatosplenomegaly Extremities: No leg edema   Portacath/PICC-erythema at the skin exit  Lab Results:  Recent Labs  12/17/12 2039 12/18/12 0508  WBC 0.6* 0.6*  HGB 10.2* 9.0*  HCT 31.0* 28.0*  PLT 76* 74*    BMET  Recent Labs  12/17/12 2039  NA 138  K 4.3  CL 103  CO2 25  GLUCOSE 142*  BUN 17  CREATININE 1.18  CALCIUM 8.5    Studies/Results: Dg Chest 2 View  12/17/2012  *RADIOLOGY REPORT*  Clinical Data: 57 year old male loss of consciousness.  History of lymphoma undergoing chemotherapy.  CHEST - 2 VIEW  Comparison: 11/15/2012.  Findings: Right chest Port-A-Cath removed.  Right side PICC line now in place.  Stable lung volumes.  Cardiac size and mediastinal contours are within normal limits.  Visualized tracheal air column is within normal limits.  No pneumothorax, pulmonary edema, pleural effusion or confluent pulmonary opacity.  No acute osseous abnormality identified.  IMPRESSION: No acute cardiopulmonary abnormality.   Original Report Authenticated By: Erskine Speed, M.D.     Medications: I have reviewed the patient's current medications.  Assessment/Plan: 1. Non-Hodgkin's lymphoma-Large abdominal mass surrounding small lymph nodes status post CT-guided biopsy on 07/20/2012 with the pathology suggestive of non-Hodgkin's lymphoma. Status post laparoscopic biopsy of the left abdominal mass on 08/21/2011 with final pathology confirming a large B-cell lymphoma, CD20 positive, with both follicular and diffuse patterns. Negative staging bone marrow biopsy. Status post cycle 1 CHOP/Rituxan on 09/06/2012. He completed cycle 5 on 12/10/2012 with Neulasta support.  2. Nausea/vomiting following cycle 1 CHOP/Rituxan. Antiemetic regimen was adjusted with cycle 2 to include Aloxi, Decadron and Emend.  3. Status post Port-A-Cath placement 08/30/2012. Status post Port-A-Cath removal 11/26/2012. 4. Neutropenia following cycle 2 CHOP/Rituxan. Neulasta was added beginning with cycle 3. 5. Hiccups following treatment.  6. "Night sweats "-etiology unclear. Resolved after removal of the Port-A-Cath 7. Hospitalization 11/15/2012 through 11/27/2012 with fever. No source of infection identified. Port-A-Cath was removed on 11/26/2012. He completed a course of IV antibiotics 12/06/2012. 8. Mild erythema at the PICC insertion site. The PICC was removed 12/18/2012 9. Admission with a syncope evident 12/17/2012-? Vasovagal 10. Pancytopenia secondary to chemotherapy  He was admitted with a syncope event on 12/17/2012. This appears to have been a vasovagal event. There is no apparent infection. The PICC line was removed today. There was mild erythema at the skin exit.  Recommendations:  1. Continue antibiotics per the infectious disease service recommendations, followup cultures 2. I expect the neutrophil count to recover over the next several  days 3. the final planned cycle of chemotherapy is due on 12/31/2012. We will plan for peripheral IV access with this cycle of chemotherapy.  I appreciate the care from the hospitalist service. I will continue to follow Mr. Carlile while he is in the hospital.       LOS: 1 day   [facility], Areli Jowett  12/18/2012, 6:14 PM

## 2012-12-18 NOTE — Progress Notes (Signed)
ANTIBIOTIC CONSULT NOTE - INITIAL  Pharmacy Consult for vancomycin/cefepime Indication: Febrile neutropenia  No Known Allergies  Patient Measurements: Height: 5\' 11"  (180.3 cm) Weight: 222 lb 3.6 oz (100.8 kg) IBW/kg (Calculated) : 75.3 Adjusted Body Weight:   Vital Signs: Temp: 98.5 F (36.9 C) (05/06 1130) Temp src: Oral (05/06 1130) BP: 151/83 mmHg (05/06 0530) Pulse Rate: 84 (05/06 0530) Intake/Output from previous day: 05/05 0701 - 05/06 0700 In: 1200 [I.V.:1200] Out: 450 [Urine:450] Intake/Output from this shift: Total I/O In: 240 [P.O.:240] Out: -   Labs:  Recent Labs  12/17/12 2039 12/18/12 0508  WBC 0.6* 0.6*  HGB 10.2* 9.0*  PLT 76* 74*  CREATININE 1.18  --    Estimated Creatinine Clearance: 84.5 ml/min (by C-G formula based on Cr of 1.18). No results found for this basename: VANCOTROUGH, Leodis Binet, VANCORANDOM, GENTTROUGH, GENTPEAK, GENTRANDOM, TOBRATROUGH, TOBRAPEAK, TOBRARND, AMIKACINPEAK, AMIKACINTROU, AMIKACIN,  in the last 72 hours   Microbiology: Recent Results (from the past 720 hour(s))  CLOSTRIDIUM DIFFICILE BY PCR     Status: None   Collection Time    11/18/12  7:49 PM      Result Value Range Status   C difficile by pcr NEGATIVE  NEGATIVE Final  CULTURE, BLOOD (ROUTINE X 2)     Status: None   Collection Time    11/20/12  8:20 PM      Result Value Range Status   Specimen Description BLOOD RIGHT HAND   Final   Special Requests BOTTLES DRAWN AEROBIC AND ANAEROBIC 10CC   Final   Culture  Setup Time 11/21/2012 01:53   Final   Culture NO GROWTH 5 DAYS   Final   Report Status 11/27/2012 FINAL   Final  CULTURE, BLOOD (ROUTINE X 2)     Status: None   Collection Time    11/20/12  8:24 PM      Result Value Range Status   Specimen Description BLOOD PORTA CATH   Final   Special Requests BOTTLES DRAWN AEROBIC AND ANAEROBIC 10CC   Final   Culture  Setup Time 11/21/2012 01:53   Final   Culture NO GROWTH 5 DAYS   Final   Report Status 11/27/2012  FINAL   Final  URINE CULTURE     Status: None   Collection Time    11/21/12 10:55 AM      Result Value Range Status   Specimen Description URINE, RANDOM   Final   Special Requests NONE   Final   Culture  Setup Time 11/21/2012 14:09   Final   Colony Count NO GROWTH   Final   Culture NO GROWTH   Final   Report Status 11/22/2012 FINAL   Final  CULTURE, BLOOD (ROUTINE X 2)     Status: None   Collection Time    11/22/12  8:30 AM      Result Value Range Status   Specimen Description BLOOD PORT   Final   Special Requests BOTTLES DRAWN AEROBIC AND ANAEROBIC 10CC   Final   Culture  Setup Time 11/22/2012 13:23   Final   Culture NO GROWTH 5 DAYS   Final   Report Status 11/28/2012 FINAL   Final  CULTURE, BLOOD (ROUTINE X 2)     Status: None   Collection Time    11/22/12  9:46 AM      Result Value Range Status   Specimen Description BLOOD LEFT ARM   Final   Special Requests BOTTLES DRAWN AEROBIC AND ANAEROBIC 5CC  Final   Culture  Setup Time 11/22/2012 13:23   Final   Culture NO GROWTH 5 DAYS   Final   Report Status 11/28/2012 FINAL   Final  MRSA PCR SCREENING     Status: None   Collection Time    11/22/12  7:57 PM      Result Value Range Status   MRSA by PCR NEGATIVE  NEGATIVE Final   Comment:            The GeneXpert MRSA Assay (FDA     approved for NASAL specimens     only), is one component of a     comprehensive MRSA colonization     surveillance program. It is not     intended to diagnose MRSA     infection nor to guide or     monitor treatment for     MRSA infections.  CATH TIP CULTURE     Status: None   Collection Time    11/26/12 12:54 PM      Result Value Range Status   Specimen Description CATH TIP   Final   Special Requests NONE   Final   Culture NO GROWTH 2 DAYS   Final   Report Status 11/29/2012 FINAL   Final  MRSA PCR SCREENING     Status: None   Collection Time    12/18/12  8:35 AM      Result Value Range Status   MRSA by PCR NEGATIVE  NEGATIVE Final    Comment:            The GeneXpert MRSA Assay (FDA     approved for NASAL specimens     only), is one component of a     comprehensive MRSA colonization     surveillance program. It is not     intended to diagnose MRSA     infection nor to guide or     monitor treatment for     MRSA infections.    Medical History: Past Medical History  Diagnosis Date  . Cancer 2013    Non-Hodgkins Lymphoma  . Other malignant lymphomas, unspecified site, extranodal and solid organ sites 2013  . Hemorrhoid   . H/O hiatal hernia     small seen on CT Scan    Medications:  Anti-infectives   Start     Dose/Rate Route Frequency Ordered Stop   12/18/12 2000  vancomycin (VANCOCIN) 1,500 mg in sodium chloride 0.9 % 500 mL IVPB     1,500 mg 250 mL/hr over 120 Minutes Intravenous Every 12 hours 12/18/12 1314     12/18/12 0800  vancomycin (VANCOCIN) IVPB 1000 mg/200 mL premix  Status:  Discontinued     1,000 mg 200 mL/hr over 60 Minutes Intravenous Every 12 hours 12/18/12 0315 12/18/12 1314   12/18/12 0600  ceFEPIme (MAXIPIME) 2 g in dextrose 5 % 50 mL IVPB     2 g 100 mL/hr over 30 Minutes Intravenous 3 times per day 12/18/12 0315     12/17/12 2300  ceFEPIme (MAXIPIME) 1 g in dextrose 5 % 50 mL IVPB  Status:  Discontinued     1 g 100 mL/hr over 30 Minutes Intravenous 3 times per day 12/17/12 2245 12/18/12 0315   12/17/12 2245  vancomycin (VANCOCIN) IVPB 1000 mg/200 mL premix     1,000 mg 200 mL/hr over 60 Minutes Intravenous  Once 12/17/12 2241 12/18/12 0112     Assessment: 57 YOF admitted with syncope/fall, h/o NH- lymphoma  with chemo (RCHOP) on 4/28. Broad spectrum abx for febrile neutropenia. He was recently admitted 4/3 for similar complaints, PAC was removed at that time. He was sent home 4/15 with more days of vanco/cefepime for total 2wk course.   5/6 >> vanc >> 5/6 >> cefepime >>   Tmax: 100.3 WBCs: 0.6 (ANC= unavailable), Neulasta 4/29 Renal: SCr=1.18  5/6 blood:pending 5/5 urine:  pending  Dose changes/drug level info:  4/14: VT = 15.3 on 1500mg  IV q12h 5/6: started on vanco 1gm IV q12h but change to 1500mg  IV q12h based on recent dosing/trough    Goal of Therapy:  Vancomycin trough level 15-20 mcg/ml Cefepime dosed based on patient weight and renal function  Plan:   Based on recent dosing and trough information, change vancomycin to 1500mg  IV q12h.  Cefepime 2gm iv q8hr  Dannielle Huh 12/18/2012,1:15 PM

## 2012-12-19 ENCOUNTER — Other Ambulatory Visit: Payer: Self-pay | Admitting: Oncology

## 2012-12-19 DIAGNOSIS — Z09 Encounter for follow-up examination after completed treatment for conditions other than malignant neoplasm: Secondary | ICD-10-CM

## 2012-12-19 DIAGNOSIS — R5381 Other malaise: Secondary | ICD-10-CM

## 2012-12-19 DIAGNOSIS — C8593 Non-Hodgkin lymphoma, unspecified, intra-abdominal lymph nodes: Secondary | ICD-10-CM

## 2012-12-19 LAB — CBC WITH DIFFERENTIAL/PLATELET
Basophils Absolute: 0 10*3/uL (ref 0.0–0.1)
Eosinophils Absolute: 0.3 10*3/uL (ref 0.0–0.7)
Lymphs Abs: 0.4 10*3/uL — ABNORMAL LOW (ref 0.7–4.0)
MCH: 28.2 pg (ref 26.0–34.0)
MCHC: 33.3 g/dL (ref 30.0–36.0)
MCV: 84.6 fL (ref 78.0–100.0)
Monocytes Absolute: 0.5 10*3/uL (ref 0.1–1.0)
Platelets: 66 10*3/uL — ABNORMAL LOW (ref 150–400)
RDW: 14.4 % (ref 11.5–15.5)

## 2012-12-19 LAB — URINE CULTURE
Colony Count: NO GROWTH
Culture: NO GROWTH

## 2012-12-19 LAB — BASIC METABOLIC PANEL
BUN: 11 mg/dL (ref 6–23)
Chloride: 101 mEq/L (ref 96–112)
Creatinine, Ser: 1.01 mg/dL (ref 0.50–1.35)
GFR calc Af Amer: >90 mL/min (ref >90)
GFR calc non Af Amer: 81 mL/min — ABNORMAL LOW (ref >90)

## 2012-12-19 NOTE — Progress Notes (Signed)
  Echocardiogram 2D Echocardiogram has been performed.  Keshanna Riso 12/19/2012, 3:05 PM

## 2012-12-19 NOTE — Progress Notes (Signed)
IP PROGRESS NOTE  Subjective:   He continues to feel weak. No recurrent syncope. He had a fever and sweats last night.  Objective: Vital signs in last 24 hours: Blood pressure 145/71, pulse 83, temperature 99.1 F (37.3 C), temperature source Oral, resp. rate 16, height 5\' 11"  (1.803 m), weight 222 lb 3.6 oz (100.8 kg), SpO2 97.00%.  Intake/Output from previous day: 05/06 0701 - 05/07 0700 In: 3856.7 [P.O.:720; I.V.:2486.7; IV Piggyback:650] Out: -   Physical Exam:  HEENT: No thrush or ulcers Lungs: Clear bilaterally Cardiac: Regular rate and rhythm Abdomen: Nontender, no hepatosplenomegaly Extremities: No leg edema   Right PICC site with a gauze dressing  Lab Results:  Recent Labs  12/18/12 0508 12/19/12 1107  WBC 0.6* 2.3*  HGB 9.0* 9.5*  HCT 28.0* 28.5*  PLT 74* 66*   ANC 1.1  BMET  Recent Labs  12/17/12 2039 12/19/12 1107  NA 138 135  K 4.3 4.0  CL 103 101  CO2 25 28  GLUCOSE 142* 114*  BUN 17 11  CREATININE 1.18 1.01  CALCIUM 8.5 8.3*    Studies/Results: Dg Chest 2 View  12/17/2012  *RADIOLOGY REPORT*  Clinical Data: 57 year old male loss of consciousness.  History of lymphoma undergoing chemotherapy.  CHEST - 2 VIEW  Comparison: 11/15/2012.  Findings: Right chest Port-A-Cath removed.  Right side PICC line now in place.  Stable lung volumes.  Cardiac size and mediastinal contours are within normal limits.  Visualized tracheal air column is within normal limits.  No pneumothorax, pulmonary edema, pleural effusion or confluent pulmonary opacity. No acute osseous abnormality identified.  IMPRESSION: No acute cardiopulmonary abnormality.   Original Report Authenticated By: Erskine Speed, M.D.     Medications: I have reviewed the patient's current medications.  Assessment/Plan: 1. Non-Hodgkin's lymphoma-Large abdominal mass surrounding small lymph nodes status post CT-guided biopsy on 07/20/2012 with the pathology suggestive of non-Hodgkin's lymphoma.  Status post laparoscopic biopsy of the left abdominal mass on 08/21/2011 with final pathology confirming a large B-cell lymphoma, CD20 positive, with both follicular and diffuse patterns. Negative staging bone marrow biopsy. Status post cycle 1 CHOP/Rituxan on 09/06/2012. He completed cycle 5 on 12/10/2012 with Neulasta support.  2. Nausea/vomiting following cycle 1 CHOP/Rituxan. Antiemetic regimen was adjusted with cycle 2 to include Aloxi, Decadron and Emend.  3. Status post Port-A-Cath placement 08/30/2012. Status post Port-A-Cath removal 11/26/2012. 4. Neutropenia following cycle 2 CHOP/Rituxan. Neulasta was added beginning with cycle 3. 5. Hiccups following treatment.  6. "Night sweats "-etiology unclear. Resolved after removal of the Port-A-Cath 7. Hospitalization 11/15/2012 through 11/27/2012 with fever. No source of infection identified. Port-A-Cath was removed on 11/26/2012. He completed a course of IV antibiotics 12/06/2012. 8. Mild erythema at the PICC insertion site. The PICC was removed 12/18/2012 9. Admission with a syncope evident 12/17/2012-? Vasovagal versus infection related 10. Pancytopenia secondary to chemotherapy-the neutrophil count is recovering 11. Fever 12/18/2012-culture from admission are negative to date  He had a fever last night. The PICC has been removed. It is not clear whether the syncope event was related to a vasovagal episode or infection. I am concerned he had an early infection based on the fever yesterday and severe neutropenia. Recommendations:  1. Continue antibiotics per the infectious disease service recommendations, followup cultures 2. outpatient followup will be scheduled for the final planned cycle of CHOP/rituximab  I appreciate the care from the hospitalist service. I will continue to follow Adam Alvarado while he is in the hospital.  LOS: 2 days   Adam Alvarado  12/19/2012, 1:14 PM

## 2012-12-19 NOTE — Progress Notes (Signed)
[facility]  Date of Admission:  12/17/2012  Antibiotics: vanco/cefepime  Subjective: Feels better overall withless weakness, up to bathroom by himself  Objective: Temp:  [98.5 F (36.9 C)-100.9 F (38.3 C)] 99.1 F (37.3 C) (05/07 0432) Pulse Rate:  [83-100] 83 (05/07 0432) Resp:  [16-28] 16 (05/07 0432) BP: (145-160)/(66-71) 145/71 mmHg (05/07 0432) SpO2:  [97 %-98 %] 97 % (05/07 0432)  General: AAO x 3, nad Skin: no rashes, no nodules Lungs: CTA B Cor: RRR without m/r/g Abdomen: soft, nt, nd, +bs HEENT: anicteric, no mucositis  Lab Results Lab Results  Component Value Date   WBC 0.6* 12/18/2012   HGB 9.0* 12/18/2012   HCT 28.0* 12/18/2012   MCV 84.3 12/18/2012   PLT 74* 12/18/2012    Lab Results  Component Value Date   CREATININE 1.18 12/17/2012   BUN 17 12/17/2012   NA 138 12/17/2012   K 4.3 12/17/2012   CL 103 12/17/2012   CO2 25 12/17/2012    Lab Results  Component Value Date   ALT 50 12/05/2012   AST 29 12/05/2012   ALKPHOS 85 12/05/2012   BILITOT 0.25 12/05/2012      Microbiology: Recent Results (from the past 240 hour(s))  URINE CULTURE     Status: None   Collection Time    12/17/12  9:53 PM      Result Value Range Status   Specimen Description URINE, CLEAN CATCH   Final   Special Requests NONE   Final   Culture  Setup Time 12/18/2012 04:07   Final   Colony Count NO GROWTH   Final   Culture NO GROWTH   Final   Report Status 12/19/2012 FINAL   Final  CULTURE, BLOOD (ROUTINE X 2)     Status: None   Collection Time    12/17/12 11:56 PM      Result Value Range Status   Specimen Description BLOOD LEFT ARM   Final   Special Requests BOTTLES DRAWN AEROBIC AND ANAEROBIC 5CC   Final   Culture  Setup Time 12/18/2012 03:50   Final   Culture     Final   Value:        BLOOD CULTURE RECEIVED NO GROWTH TO DATE CULTURE WILL BE HELD FOR 5 DAYS BEFORE ISSUING A FINAL NEGATIVE REPORT   Report Status PENDING   Incomplete  CULTURE, BLOOD (ROUTINE X 2)      Status: None   Collection Time    12/18/12 12:00 AM      Result Value Range Status   Specimen Description BLOOD RIGHT PIC   Final   Special Requests BOTTLES DRAWN AEROBIC AND ANAEROBIC 5CC   Final   Culture  Setup Time 12/18/2012 03:50   Final   Culture     Final   Value:        BLOOD CULTURE RECEIVED NO GROWTH TO DATE CULTURE WILL BE HELD FOR 5 DAYS BEFORE ISSUING A FINAL NEGATIVE REPORT   Report Status PENDING   Incomplete  MRSA PCR SCREENING     Status: None   Collection Time    12/18/12  8:35 AM      Result Value Range Status   MRSA by PCR NEGATIVE  NEGATIVE Final   Comment:            The GeneXpert MRSA Assay (FDA     approved for NASAL specimens     only), is one component of a  comprehensive MRSA colonization     surveillance program. It is not     intended to diagnose MRSA     infection nor to guide or     monitor treatment for     MRSA infections.    Studies/Results: Dg Chest 2 View  12/17/2012  *RADIOLOGY REPORT*  Clinical Data: 57 year old male loss of consciousness.  History of lymphoma undergoing chemotherapy.  CHEST - 2 VIEW  Comparison: 11/15/2012.  Findings: Right chest Port-A-Cath removed.  Right side PICC line now in place.  Stable lung volumes.  Cardiac size and mediastinal contours are within normal limits.  Visualized tracheal air column is within normal limits.  No pneumothorax, pulmonary edema, pleural effusion or confluent pulmonary opacity. No acute osseous abnormality identified.  IMPRESSION: No acute cardiopulmonary abnormality.   Original Report Authenticated By: Erskine Speed, M.D.     Assessment/Plan: 1)  Neutropenic fever - low temp up to 100.9 that was not sustained (did get aspirin).  No new symptoms or findings on exam.  Continue current antibiotics.  Awaiting count recovery and anticipate going home without line.    Staci Righter, MD [facility] [phone] pager   12/19/2012, 11:08 AM

## 2012-12-19 NOTE — Progress Notes (Signed)
[facility] PROGRESS NOTE  Adam Alvarado EAV:[id number] DOB: 08-24-1955 DOA: 12/17/2012 PCP: [name], MD  Assessment/Plan: Neutropenia  Continues to have temperatures around 100. -continue empiric abx with Vanc and cefepime pending cultures -appreciate onc assistance, awaiting ID eval and recs White blood cell count starting to increase. -pt states he received neulasta but that it has not been helping with his wbc Active Problems:  Syncope  -possibly vasovagal Echocardiogram unremarkable Non-Hodgkin lymphoma of intra-abdominal lymph nodes  -per onc Now with white count resolving, but with continued tenths, we'll discuss with ID and on as far as antibiotic plan tomorrow    Code Status: full Family Communication: wife at bedside Disposition Plan: Home in the next few days.   Consultants:  onc  ID  Procedures:  Echo -done 5/7: Unremarkable.  Antibiotics:  Vanc and Cefepime started 5/5  HPI/Subjective: Patient doing okay. No complaints. No dizziness, chest pain palpitations or shortness of breath.  Objective: Filed Vitals:   12/18/12 2015 12/18/12 2054 12/19/12 0432 12/19/12 1407  BP:  146/70 145/71 141/66  Pulse:  100 83 86  Temp: 98.8 F (37.1 C) 98.6 F (37 C) 99.1 F (37.3 C) 99.4 F (37.4 C)  TempSrc:  Oral Oral Oral  Resp:  16 16 18   Height:      Weight:      SpO2:  98% 97% 97%    Intake/Output Summary (Last 24 hours) at 12/19/12 1853 Last data filed at 12/19/12 1826  Gross per 24 hour  Intake 3911.67 ml  Output      0 ml  Net 3911.67 ml   Filed Weights   12/17/12 2038 12/18/12 0248  Weight: 102.059 kg (225 lb) 100.8 kg (222 lb 3.6 oz)    Exam:   General: alert and orientedx3, no acute distress  Cardiovascular: RRR, S1-S2  Respiratory: CTAB  Abdomen: Soft+BS, NT/ND  EXtremities: no cyanosis and no edema  Data Reviewed: Basic Metabolic Panel:  Recent Labs Lab 12/17/12 2039 12/19/12 1107  NA 138 135  K 4.3 4.0  CL  103 101  CO2 25 28  GLUCOSE 142* 114*  BUN 17 11  CREATININE 1.18 1.01  CALCIUM 8.5 8.3*   CBC:  Recent Labs Lab 12/17/12 2039 12/18/12 0508 12/19/12 1107  WBC 0.6* 0.6* 2.3*  NEUTROABS  --  0.1* 1.1*  HGB 10.2* 9.0* 9.5*  HCT 31.0* 28.0* 28.5*  MCV 84.2 84.3 84.6  PLT 76* 74* 66*     Recent Results (from the past 240 hour(s))  URINE CULTURE     Status: None   Collection Time    12/17/12  9:53 PM      Result Value Range Status   Specimen Description URINE, CLEAN CATCH   Final   Special Requests NONE   Final   Culture  Setup Time 12/18/2012 04:07   Final   Colony Count NO GROWTH   Final   Culture NO GROWTH   Final   Report Status 12/19/2012 FINAL   Final  CULTURE, BLOOD (ROUTINE X 2)     Status: None   Collection Time    12/17/12 11:56 PM      Result Value Range Status   Specimen Description BLOOD LEFT ARM   Final   Special Requests BOTTLES DRAWN AEROBIC AND ANAEROBIC 5CC   Final   Culture  Setup Time 12/18/2012 03:50   Final   Culture     Final   Value:        BLOOD CULTURE RECEIVED  NO GROWTH TO DATE CULTURE WILL BE HELD FOR 5 DAYS BEFORE ISSUING A FINAL NEGATIVE REPORT   Report Status PENDING   Incomplete  CULTURE, BLOOD (ROUTINE X 2)     Status: None   Collection Time    12/18/12 12:00 AM      Result Value Range Status   Specimen Description BLOOD RIGHT PIC   Final   Special Requests BOTTLES DRAWN AEROBIC AND ANAEROBIC 5CC   Final   Culture  Setup Time 12/18/2012 03:50   Final   Culture     Final   Value:        BLOOD CULTURE RECEIVED NO GROWTH TO DATE CULTURE WILL BE HELD FOR 5 DAYS BEFORE ISSUING A FINAL NEGATIVE REPORT   Report Status PENDING   Incomplete  MRSA PCR SCREENING     Status: None   Collection Time    12/18/12  8:35 AM      Result Value Range Status   MRSA by PCR NEGATIVE  NEGATIVE Final   Comment:            The GeneXpert MRSA Assay (FDA     approved for NASAL specimens     only), is one component of a     comprehensive MRSA colonization      surveillance program. It is not     intended to diagnose MRSA     infection nor to guide or     monitor treatment for     MRSA infections.     Studies: Dg Chest 2 View  12/17/2012  IMPRESSION: No acute cardiopulmonary abnormality.   Original Report Authenticated By: Erskine Speed, M.D.     Scheduled Meds: . ceFEPime (MAXIPIME) IV  2 g Intravenous Q8H  . enoxaparin (LOVENOX) injection  40 mg Subcutaneous Q24H  . sodium chloride  3 mL Intravenous Q12H  . vancomycin  1,500 mg Intravenous Q12H   Continuous Infusions: . sodium chloride 100 mL/hr at 12/19/12 0507    Principal Problem:   Neutropenia Active Problems:   Non-Hodgkin lymphoma of intra-abdominal lymph nodes   Syncope   Fever    Time spent: 20 minutes    Hollice Espy  [facility] Pager [phone]. If 7PM-7AM, please contact night-coverage at [email or web address], password [facility] 12/19/2012, 6:53 PM  LOS: 2 days

## 2012-12-20 ENCOUNTER — Ambulatory Visit: Payer: BC Managed Care – PPO

## 2012-12-20 ENCOUNTER — Other Ambulatory Visit: Payer: BC Managed Care – PPO | Admitting: Lab

## 2012-12-20 ENCOUNTER — Ambulatory Visit: Payer: BC Managed Care – PPO | Admitting: Oncology

## 2012-12-20 DIAGNOSIS — C8583 Other specified types of non-Hodgkin lymphoma, intra-abdominal lymph nodes: Secondary | ICD-10-CM

## 2012-12-20 LAB — CBC WITH DIFFERENTIAL/PLATELET
Basophils Absolute: 0 10*3/uL (ref 0.0–0.1)
Eosinophils Absolute: 0.3 10*3/uL (ref 0.0–0.7)
HCT: 29.5 % — ABNORMAL LOW (ref 39.0–52.0)
Lymphocytes Relative: 15 % (ref 12–46)
MCHC: 31.5 g/dL (ref 30.0–36.0)
Neutro Abs: 3 10*3/uL (ref 1.7–7.7)
RDW: 14.8 % (ref 11.5–15.5)

## 2012-12-20 NOTE — Discharge Summary (Signed)
Physician Discharge Summary  Adam Alvarado ZOX:[id number] DOB: December 11, 1955 DOA: 12/17/2012  PCP: [name], MD  Admit date: 12/17/2012 Discharge date: 12/20/2012  Time spent: 25 minutes  Recommendations for Outpatient Follow-up:  1. Patient will followup with Dr. Orvan Falconer of infectious disease as scheduled next week 2. Patient will followup with Dr. Truett Perna for his last round of chemotherapy as scheduled  Discharge Diagnoses:  Principal Problem:   Neutropenia Active Problems:   Non-Hodgkin lymphoma of intra-abdominal lymph nodes   Syncope   Fever   Discharge Condition: Improved, being discharged home  Diet recommendation: Regular  Filed Weights   12/17/12 2038 12/18/12 0248  Weight: 102.059 kg (225 lb) 100.8 kg (222 lb 3.6 oz)    History of present illness:  On 5/5:Adam Alvarado is a 57 y.o. male, with past medical history significant for lymphoma diagnosed last Thanksgiving with most recent chemotherapy 1 week ago presenting today with an episode of fall. Patient was sitting on the commode when he suddenly felt dizzy but did not fall or lose consciousness. Patient was sweating profusely while being dizzy and hence ended up calling EMS. Patient had another episode of dizziness where he lost consciousness and helped by 6 EMS personel to get to the EMS vehicle. His BP was in 60's and EMS personnel were unable to find his pulse. Patient's BP was normal on arrival in ER. He denies any fevers, chills, shortness of breath, chest pain, difficulty urination or weakness or numbness anywhere in the body. Patient's WBC count was 0.6. Oncology was consulted by the emergency room doctor and advised treating him as if febrile neutropenia.  Patient will be admitted to telemetry for management of neutropenia with oncology on board as consultants.   Hospital Course:  Principal Problem:   Neutropenia: Patient is a previous problems neutropenia, despite using Neulasta before chemotherapy. He was watched  closely and after several days, his white blood cell count increased. By day of discharge his white count was up to 4.8. His blood count was at 83 with a hemoglobin of 9.3.  Active Problems:   Non-Hodgkin lymphoma of intra-abdominal lymph nodes: Last chemotherapy dose scheduled coming up soon.    Syncope: Felt to be likely vasovagal. Echocardiogram was unremarkable. No evidence by telemetry. Patient may have had some mild dehydration brought on by fever, cause unclear.    Fever: By hospital day 2, patient started having low-grade temperatures. Infectious disease was consulted. Blood cultures are drawn which have been negative to date. He was put on vancomycin and cefepime. Urinalysis and chest x-ray or unremarkable. PICC line was discontinued. He was recommended not to place a new central line or PICC line until cause could be discovered. Patient continued to have low-grade temperatures but no signs of infection were found. Infectious disease recommended by 5/8, patient stable for discharge at his white count was in normalized. He did not recommend IV antibiotics at home. This patient's second hospitalization involving fever of unknown origin. Previous auscultation previous workup was unremarkable.   Procedures:  Echocardiogram done 5/7: Normal findings  Consultations:  Oncology-[place]  Infectious disease-[place]  Discharge Exam: Filed Vitals:   12/19/12 1407 12/19/12 2137 12/20/12 0514 12/20/12 1300  BP: 141/66 140/70 130/79 140/64  Pulse: 86 83 73 76  Temp: 99.4 F (37.4 C) 98.6 F (37 C) 98.5 F (36.9 C) 98.4 F (36.9 C)  TempSrc: Oral Oral Oral Oral  Resp: 18 18 18 18   Height:      Weight:      SpO2:  97% 97% 98% 98%    General: Alert and oriented x3, no acute distress Cardiovascular: Regular rate and rhythm, S1-S2 Respiratory: Clear to auscultation bilaterally Abdomen: Soft, nontender, nondistended, positive bowel sounds Extremities: No clubbing or cyanosis or  edema  Discharge Instructions  Discharge Orders   Future Appointments Provider Department Dept Phone   12/25/2012 11:15 AM [name], MD [facility] for Infectious Disease [phone]   Future Orders Complete By Expires     Diet general  As directed     Increase activity slowly  As directed         Medication List    TAKE these medications       aspirin 325 MG tablet  Take 2 tablets (650 mg total) by mouth every 6 (six) hours as needed for pain or fever.     baclofen 10 MG tablet  Commonly known as:  LIORESAL  Take 5 mg by mouth 3 (three) times daily as needed (for hiccups).     LORazepam 1 MG tablet  Commonly known as:  ATIVAN  Place 1 tablet (1 mg total) under the tongue every 6 (six) hours as needed (nausea).     predniSONE 20 MG tablet  Commonly known as:  DELTASONE  Take 5 tablets (100 mg total) by mouth daily. X 5 days  Begin on day 1 of each chemo cycle     PRESCRIPTION MEDICATION  Inject into the vein every 21 ( twenty-one) days. CHOP infusion every 21 days.     prochlorperazine 10 MG tablet  Commonly known as:  COMPAZINE  Take 1 tablet (10 mg total) by mouth every 6 (six) hours as needed (nausea).     PROCTOZONE-HC 2.5 % rectal cream  Generic drug:  hydrocortisone  Place 1 application rectally 2 (two) times daily as needed for hemorrhoids.       No Known Allergies     Follow-up Information   Follow up with [name], MD In 6 weeks. (As needed)    Contact information:   [address] [place] [address] [phone]       Follow up with [name], MD. (As scheduled)    Contact information:   [address] [place] [address] [phone]       Follow up with [name], MD. (As scheduled)    Contact information:   [address] [place] [address] [phone]        The results of significant diagnostics from this hospitalization (including imaging,  microbiology, ancillary and laboratory) are listed below for reference.    Significant Diagnostic Studies: Dg Chest 2 View  12/17/2012    IMPRESSION: No acute cardiopulmonary abnormality.   Microbiology: Recent Results (from the past 240 hour(s))  URINE CULTURE     Status: None   Collection Time    12/17/12  9:53 PM      Result Value Range Status   Specimen Description URINE, CLEAN CATCH   Final   Special Requests NONE   Final   Culture  Setup Time 12/18/2012 04:07   Final   Colony Count NO GROWTH   Final   Culture NO GROWTH   Final   Report Status 12/19/2012 FINAL   Final  CULTURE, BLOOD (ROUTINE X 2)     Status: None   Collection Time    12/17/12 11:56 PM      Result Value Range Status   Specimen Description BLOOD LEFT ARM  Final   Special Requests BOTTLES DRAWN AEROBIC AND ANAEROBIC 5CC   Final   Culture  Setup Time 12/18/2012 03:50   Final   Culture     Final   Value:        BLOOD CULTURE RECEIVED NO GROWTH TO DATE CULTURE WILL BE HELD FOR 5 DAYS BEFORE ISSUING A FINAL NEGATIVE REPORT   Report Status PENDING   Incomplete  CULTURE, BLOOD (ROUTINE X 2)     Status: None   Collection Time    12/18/12 12:00 AM      Result Value Range Status   Specimen Description BLOOD RIGHT PIC   Final   Special Requests BOTTLES DRAWN AEROBIC AND ANAEROBIC 5CC   Final   Culture  Setup Time 12/18/2012 03:50   Final   Culture     Final   Value:        BLOOD CULTURE RECEIVED NO GROWTH TO DATE CULTURE WILL BE HELD FOR 5 DAYS BEFORE ISSUING A FINAL NEGATIVE REPORT   Report Status PENDING   Incomplete  MRSA PCR SCREENING     Status: None   Collection Time    12/18/12  8:35 AM      Result Value Range Status   MRSA by PCR NEGATIVE  NEGATIVE Final   Comment:            The GeneXpert MRSA Assay (FDA     approved for NASAL specimens     only), is one component of a     comprehensive MRSA colonization     surveillance program. It is not     intended to diagnose MRSA     infection nor to guide or      monitor treatment for     MRSA infections.     Labs: Basic Metabolic Panel:  Recent Labs Lab 12/17/12 2039 12/19/12 1107  NA 138 135  K 4.3 4.0  CL 103 101  CO2 25 28  GLUCOSE 142* 114*  BUN 17 11  CREATININE 1.18 1.01  CALCIUM 8.5 8.3*   CBC:  Recent Labs Lab 12/17/12 2039 12/18/12 0508 12/19/12 1107 12/20/12 0554  WBC 0.6* 0.6* 2.3* 4.8  NEUTROABS  --  0.1* 1.1* 3.0  HGB 10.2* 9.0* 9.5* 9.3*  HCT 31.0* 28.0* 28.5* 29.5*  MCV 84.2 84.3 84.6 84.5  PLT 76* 74* 66* 83*     Signed:  Juneau Doughman K  [facility] 12/20/2012, 2:57 PM

## 2012-12-20 NOTE — Progress Notes (Signed)
[facility]  Date of Admission:  12/17/2012  Antibiotics: vanco/cefepime  Subjective: Overall feels back to his recent baseline  Objective: Temp:  [98.4 F (36.9 C)-99.4 F (37.4 C)] 98.4 F (36.9 C) (05/08 1300) Pulse Rate:  [73-86] 76 (05/08 1300) Resp:  [18] 18 (05/08 1300) BP: (130-141)/(64-79) 140/64 mmHg (05/08 1300) SpO2:  [97 %-98 %] 98 % (05/08 1300)  General: AAO x 3, nad Skin: no rashes, no nodules Lungs: CTA B Cor: RRR without m/r/g Abdomen: soft, nt, nd, +bs HEENT: anicteric, no mucositis  Lab Results Lab Results  Component Value Date   WBC 4.8 12/20/2012   HGB 9.3* 12/20/2012   HCT 29.5* 12/20/2012   MCV 84.5 12/20/2012   PLT 83* 12/20/2012    Lab Results  Component Value Date   CREATININE 1.01 12/19/2012   BUN 11 12/19/2012   NA 135 12/19/2012   K 4.0 12/19/2012   CL 101 12/19/2012   CO2 28 12/19/2012    Lab Results  Component Value Date   ALT 50 12/05/2012   AST 29 12/05/2012   ALKPHOS 85 12/05/2012   BILITOT 0.25 12/05/2012      Microbiology: Recent Results (from the past 240 hour(s))  URINE CULTURE     Status: None   Collection Time    12/17/12  9:53 PM      Result Value Range Status   Specimen Description URINE, CLEAN CATCH   Final   Special Requests NONE   Final   Culture  Setup Time 12/18/2012 04:07   Final   Colony Count NO GROWTH   Final   Culture NO GROWTH   Final   Report Status 12/19/2012 FINAL   Final  CULTURE, BLOOD (ROUTINE X 2)     Status: None   Collection Time    12/17/12 11:56 PM      Result Value Range Status   Specimen Description BLOOD LEFT ARM   Final   Special Requests BOTTLES DRAWN AEROBIC AND ANAEROBIC 5CC   Final   Culture  Setup Time 12/18/2012 03:50   Final   Culture     Final   Value:        BLOOD CULTURE RECEIVED NO GROWTH TO DATE CULTURE WILL BE HELD FOR 5 DAYS BEFORE ISSUING A FINAL NEGATIVE REPORT   Report Status PENDING   Incomplete  CULTURE, BLOOD (ROUTINE X 2)     Status: None   Collection Time    12/18/12 12:00 AM      Result Value Range Status   Specimen Description BLOOD RIGHT PIC   Final   Special Requests BOTTLES DRAWN AEROBIC AND ANAEROBIC 5CC   Final   Culture  Setup Time 12/18/2012 03:50   Final   Culture     Final   Value:        BLOOD CULTURE RECEIVED NO GROWTH TO DATE CULTURE WILL BE HELD FOR 5 DAYS BEFORE ISSUING A FINAL NEGATIVE REPORT   Report Status PENDING   Incomplete  MRSA PCR SCREENING     Status: None   Collection Time    12/18/12  8:35 AM      Result Value Range Status   MRSA by PCR NEGATIVE  NEGATIVE Final   Comment:            The GeneXpert MRSA Assay (FDA     approved for NASAL specimens     only), is one component of a     comprehensive MRSA  colonization     surveillance program. It is not     intended to diagnose MRSA     infection nor to guide or     monitor treatment for     MRSA infections.    Studies/Results: No results found.  Assessment/Plan: 1)  Neutropenic fever - no further fever, no longer neutropenic.   -stop antibiotics, no source identified. -ok from an ID standpoint for d/c -he already has follow up with Dr. Orvan Falconer for next week and will keep appt, even if feeling well  Staci Righter, MD [facility] for Infectious Disease [facility] [phone] pager   12/20/2012, 1:51 PM

## 2012-12-21 ENCOUNTER — Ambulatory Visit: Payer: BC Managed Care – PPO

## 2012-12-21 ENCOUNTER — Telehealth: Payer: Self-pay | Admitting: Oncology

## 2012-12-21 NOTE — Telephone Encounter (Signed)
s.w. pt wife and advised on 5.28.14 appt....ok and aware

## 2012-12-24 LAB — CULTURE, BLOOD (ROUTINE X 2): Culture: NO GROWTH

## 2012-12-25 ENCOUNTER — Ambulatory Visit: Payer: BC Managed Care – PPO | Admitting: Internal Medicine

## 2012-12-25 ENCOUNTER — Telehealth: Payer: Self-pay | Admitting: *Deleted

## 2012-12-25 NOTE — Telephone Encounter (Signed)
Unable to come today.  Rescheduled appt

## 2012-12-26 ENCOUNTER — Encounter: Payer: Self-pay | Admitting: Oncology

## 2013-01-06 ENCOUNTER — Other Ambulatory Visit: Payer: Self-pay | Admitting: Oncology

## 2013-01-09 ENCOUNTER — Telehealth: Payer: Self-pay | Admitting: Oncology

## 2013-01-09 ENCOUNTER — Other Ambulatory Visit (HOSPITAL_BASED_OUTPATIENT_CLINIC_OR_DEPARTMENT_OTHER): Payer: BC Managed Care – PPO | Admitting: Lab

## 2013-01-09 ENCOUNTER — Ambulatory Visit (HOSPITAL_BASED_OUTPATIENT_CLINIC_OR_DEPARTMENT_OTHER): Payer: BC Managed Care – PPO

## 2013-01-09 ENCOUNTER — Ambulatory Visit (HOSPITAL_BASED_OUTPATIENT_CLINIC_OR_DEPARTMENT_OTHER): Payer: BC Managed Care – PPO | Admitting: Oncology

## 2013-01-09 VITALS — BP 131/68 | HR 64 | Temp 98.1°F | Resp 18

## 2013-01-09 VITALS — BP 149/82 | HR 79 | Temp 98.2°F | Resp 18 | Ht 71.0 in | Wt 230.4 lb

## 2013-01-09 DIAGNOSIS — C8593 Non-Hodgkin lymphoma, unspecified, intra-abdominal lymph nodes: Secondary | ICD-10-CM

## 2013-01-09 DIAGNOSIS — C8583 Other specified types of non-Hodgkin lymphoma, intra-abdominal lymph nodes: Secondary | ICD-10-CM

## 2013-01-09 DIAGNOSIS — Z5112 Encounter for antineoplastic immunotherapy: Secondary | ICD-10-CM

## 2013-01-09 DIAGNOSIS — D709 Neutropenia, unspecified: Secondary | ICD-10-CM

## 2013-01-09 DIAGNOSIS — Z5111 Encounter for antineoplastic chemotherapy: Secondary | ICD-10-CM

## 2013-01-09 LAB — CBC WITH DIFFERENTIAL/PLATELET
BASO%: 0.8 % (ref 0.0–2.0)
EOS%: 1.6 % (ref 0.0–7.0)
LYMPH%: 17.3 % (ref 14.0–49.0)
MCH: 28 pg (ref 27.2–33.4)
MCHC: 32.5 g/dL (ref 32.0–36.0)
MCV: 86.2 fL (ref 79.3–98.0)
MONO#: 0.6 10*3/uL (ref 0.1–0.9)
MONO%: 11.5 % (ref 0.0–14.0)
Platelets: 208 10*3/uL (ref 140–400)
RBC: 4.35 10*6/uL (ref 4.20–5.82)
WBC: 5.1 10*3/uL (ref 4.0–10.3)
nRBC: 0 % (ref 0–0)

## 2013-01-09 LAB — COMPREHENSIVE METABOLIC PANEL (CC13)
ALT: 29 U/L (ref 0–55)
AST: 22 U/L (ref 5–34)
Creatinine: 1.1 mg/dL (ref 0.7–1.3)
Total Bilirubin: 0.3 mg/dL (ref 0.20–1.20)

## 2013-01-09 MED ORDER — SODIUM CHLORIDE 0.9 % IV SOLN
150.0000 mg | Freq: Once | INTRAVENOUS | Status: AC
  Administered 2013-01-09: 150 mg via INTRAVENOUS
  Filled 2013-01-09: qty 5

## 2013-01-09 MED ORDER — SODIUM CHLORIDE 0.9 % IV SOLN
525.0000 mg/m2 | Freq: Once | INTRAVENOUS | Status: AC
  Administered 2013-01-09: 1180 mg via INTRAVENOUS
  Filled 2013-01-09: qty 59

## 2013-01-09 MED ORDER — DEXAMETHASONE SODIUM PHOSPHATE 10 MG/ML IJ SOLN
10.0000 mg | Freq: Once | INTRAMUSCULAR | Status: AC
  Administered 2013-01-09: 10 mg via INTRAVENOUS

## 2013-01-09 MED ORDER — VINCRISTINE SULFATE CHEMO INJECTION 1 MG/ML
2.0000 mg | Freq: Once | INTRAVENOUS | Status: AC
  Administered 2013-01-09: 2 mg via INTRAVENOUS
  Filled 2013-01-09: qty 2

## 2013-01-09 MED ORDER — DIPHENHYDRAMINE HCL 25 MG PO CAPS
50.0000 mg | ORAL_CAPSULE | Freq: Once | ORAL | Status: AC
  Administered 2013-01-09: 50 mg via ORAL

## 2013-01-09 MED ORDER — DOXORUBICIN HCL CHEMO IV INJECTION 2 MG/ML
35.0000 mg/m2 | Freq: Once | INTRAVENOUS | Status: AC
  Administered 2013-01-09: 78 mg via INTRAVENOUS
  Filled 2013-01-09: qty 39

## 2013-01-09 MED ORDER — ACETAMINOPHEN 325 MG PO TABS
650.0000 mg | ORAL_TABLET | Freq: Once | ORAL | Status: AC
  Administered 2013-01-09: 650 mg via ORAL

## 2013-01-09 MED ORDER — PALONOSETRON HCL INJECTION 0.25 MG/5ML
0.2500 mg | Freq: Once | INTRAVENOUS | Status: AC
  Administered 2013-01-09: 0.25 mg via INTRAVENOUS

## 2013-01-09 MED ORDER — SODIUM CHLORIDE 0.9 % IV SOLN
375.0000 mg/m2 | Freq: Once | INTRAVENOUS | Status: AC
  Administered 2013-01-09: 800 mg via INTRAVENOUS
  Filled 2013-01-09: qty 80

## 2013-01-09 MED ORDER — SODIUM CHLORIDE 0.9 % IV SOLN
Freq: Once | INTRAVENOUS | Status: AC
  Administered 2013-01-09: 11:00:00 via INTRAVENOUS

## 2013-01-09 NOTE — Telephone Encounter (Signed)
gv pt appt schedule for June and pt aware central will call w/scan appt.

## 2013-01-09 NOTE — Patient Instructions (Addendum)
[facility] Discharge Instructions for Patients Receiving Chemotherapy  Today you received the following chemotherapy agents: rituxan, adriamycin, vincristine, cytoxan  To help prevent nausea and vomiting after your treatment, we encourage you to take your nausea medication.  Take it as often as prescribed.     If you develop nausea and vomiting that is not controlled by your nausea medication, call the clinic. If it is after clinic hours your family physician or the after hours number for the clinic or go to the Emergency Department.   BELOW ARE SYMPTOMS THAT SHOULD BE REPORTED IMMEDIATELY:  *FEVER GREATER THAN 100.5 F  *CHILLS WITH OR WITHOUT FEVER  NAUSEA AND VOMITING THAT IS NOT CONTROLLED WITH YOUR NAUSEA MEDICATION  *UNUSUAL SHORTNESS OF BREATH  *UNUSUAL BRUISING OR BLEEDING  TENDERNESS IN MOUTH AND THROAT WITH OR WITHOUT PRESENCE OF ULCERS  *URINARY PROBLEMS  *BOWEL PROBLEMS  UNUSUAL RASH Items with * indicate a potential emergency and should be followed up as soon as possible.  Feel free to call the clinic you have any questions or concerns. The clinic phone number is [phone].   I have been informed and understand all the instructions given to me. I know to contact the clinic, my physician, or go to the Emergency Department if any problems should occur. I do not have any questions at this time, but understand that I may call the clinic during office hours   should I have any questions or need assistance in obtaining follow up care.    __________________________________________  _____________  __________ Signature of Patient or Authorized Representative            Date                   Time    __________________________________________ Nurse's Signature

## 2013-01-09 NOTE — Progress Notes (Signed)
   [facility]    OFFICE PROGRESS NOTE   INTERVAL HISTORY:   He returns as scheduled. He was admitted 12/17/2012 with a syncope event. He was neutropenic and had a fever. No source for infection was identified. The PICC was removed. He now feels well. Good appetite. He has not returned to work.  Objective:  Vital signs in last 24 hours:  Blood pressure 149/82, pulse 79, temperature 98.2 F (36.8 C), temperature source Oral, resp. rate 18, height 5\' 11"  (1.803 m), weight 230 lb 6.4 oz (104.509 kg), SpO2 100.00%.    HEENT: No thrush or ulcers Resp: Lungs clear bilaterally Cardio: Regular rate and rhythm GI: No hepatosplenomegaly, nontender, no mass Vascular:  No leg edema   Lab Results:  Lab Results  Component Value Date   WBC 5.1 01/09/2013   HGB 12.2* 01/09/2013   HCT 37.5* 01/09/2013   MCV 86.2 01/09/2013   PLT 208 01/09/2013   ANC 3.5   Medications: I have reviewed the patient's current medications.  Assessment/Plan: 1. Non-Hodgkin's lymphoma-Large abdominal mass surrounding small lymph nodes status post CT-guided biopsy on 07/20/2012 with the pathology suggestive of non-Hodgkin's lymphoma. Status post laparoscopic biopsy of the left abdominal mass on 08/21/2011 with final pathology confirming a large B-cell lymphoma, CD20 positive, with both follicular and diffuse patterns. Negative staging bone marrow biopsy. Status post cycle 1 CHOP/Rituxan on 09/06/2012. He completed cycle 5 on 12/10/2012 with Neulasta support.  2. Nausea/vomiting following cycle 1 CHOP/Rituxan. Antiemetic regimen was adjusted with cycle 2 to include Aloxi, Decadron and Emend.  3. Status post Port-A-Cath placement 08/30/2012. Status post Port-A-Cath removal 11/26/2012. 4. Neutropenia following cycle 2 CHOP/Rituxan. Neulasta was added beginning with cycle 3. 5. Hiccups following treatment.  6. "Night sweats "-etiology unclear. Resolved after removal of the Port-A-Cath 7. Hospitalization  11/15/2012 through 11/27/2012 with fever. No source of infection identified. Port-A-Cath was removed on 11/26/2012. He completed a course of IV antibiotics 12/06/2012. 8. Admission with a syncope event on 12/17/2012, day 8 following cycle 5 CHOP-Rituxan  9.   fever and neutropenia following cycle 5 CHOP-Rituxan, no source for infection was identified. A PICC was removed  Disposition:   Adam Alvarado appears well. He has completed 5 cycles of CHOP/rituximab. He remains in clinical remission from the non-Hodgkin's lymphoma. He was admitted with febrile neutropenia following the last 2 cycles of chemotherapy. No clear source for infection was identified. The Port-A-Cath and a PICC had been removed.   The plan is to proceed with the final cycle of chemotherapy today. The doxorubicin and Cytoxan will be dose reduced with this cycle. He will receive Neulasta. He will return for a CBC in one week and 2 weeks. Adam Alvarado will be scheduled for a restaging PET scan and office visit in approximately 3 weeks. He knows to contact us for fever.    [name], MD  01/09/2013  10:40 AM

## 2013-01-10 ENCOUNTER — Ambulatory Visit (HOSPITAL_BASED_OUTPATIENT_CLINIC_OR_DEPARTMENT_OTHER): Payer: BC Managed Care – PPO

## 2013-01-10 ENCOUNTER — Other Ambulatory Visit (HOSPITAL_COMMUNITY): Payer: Self-pay | Admitting: Interventional Radiology

## 2013-01-10 VITALS — BP 141/72 | HR 85 | Temp 98.2°F

## 2013-01-10 DIAGNOSIS — D709 Neutropenia, unspecified: Secondary | ICD-10-CM

## 2013-01-10 DIAGNOSIS — C8593 Non-Hodgkin lymphoma, unspecified, intra-abdominal lymph nodes: Secondary | ICD-10-CM

## 2013-01-10 MED ORDER — PEGFILGRASTIM INJECTION 6 MG/0.6ML
6.0000 mg | Freq: Once | SUBCUTANEOUS | Status: AC
  Administered 2013-01-10: 6 mg via SUBCUTANEOUS
  Filled 2013-01-10: qty 0.6

## 2013-01-10 NOTE — Patient Instructions (Addendum)

## 2013-01-15 ENCOUNTER — Telehealth: Payer: Self-pay | Admitting: Oncology

## 2013-01-15 NOTE — Telephone Encounter (Signed)
lmonvm for Zacarias Pontes, NP Coord @ [facility] requesting appt w/Dr. Derrell Lolling. Awaiting return call. Pt given other appts prior to leaving 5/28.

## 2013-01-16 ENCOUNTER — Other Ambulatory Visit (HOSPITAL_BASED_OUTPATIENT_CLINIC_OR_DEPARTMENT_OTHER): Payer: BC Managed Care – PPO

## 2013-01-16 ENCOUNTER — Telehealth: Payer: Self-pay | Admitting: Oncology

## 2013-01-16 DIAGNOSIS — C8593 Non-Hodgkin lymphoma, unspecified, intra-abdominal lymph nodes: Secondary | ICD-10-CM

## 2013-01-16 DIAGNOSIS — C8583 Other specified types of non-Hodgkin lymphoma, intra-abdominal lymph nodes: Secondary | ICD-10-CM

## 2013-01-16 LAB — CBC WITH DIFFERENTIAL/PLATELET
Basophils Absolute: 0 10*3/uL (ref 0.0–0.1)
EOS%: 4 % (ref 0.0–7.0)
Eosinophils Absolute: 0.2 10*3/uL (ref 0.0–0.5)
HCT: 35.6 % — ABNORMAL LOW (ref 38.4–49.9)
HGB: 11.8 g/dL — ABNORMAL LOW (ref 13.0–17.1)
MCH: 28.3 pg (ref 27.2–33.4)
MCV: 85.4 fL (ref 79.3–98.0)
MONO%: 7.8 % (ref 0.0–14.0)
NEUT%: 73 % (ref 39.0–75.0)
lymph#: 0.8 10*3/uL — ABNORMAL LOW (ref 0.9–3.3)

## 2013-01-16 NOTE — Telephone Encounter (Signed)
S/w pt wife re appt for 6/19 w/Dr. Derrell Lolling. Pt has other appts.

## 2013-01-17 ENCOUNTER — Encounter: Payer: Self-pay | Admitting: Oncology

## 2013-01-17 NOTE — Progress Notes (Signed)
01/17/2013 Spoke with patient's wife concerning PET SCAN scheduled for January 30, 2013.  This scan is authorized using the same number as before due to patient's hospitalization.  Bonita Quin [phone]

## 2013-01-23 ENCOUNTER — Other Ambulatory Visit (HOSPITAL_BASED_OUTPATIENT_CLINIC_OR_DEPARTMENT_OTHER): Payer: BC Managed Care – PPO

## 2013-01-23 DIAGNOSIS — C8593 Non-Hodgkin lymphoma, unspecified, intra-abdominal lymph nodes: Secondary | ICD-10-CM

## 2013-01-23 DIAGNOSIS — C8583 Other specified types of non-Hodgkin lymphoma, intra-abdominal lymph nodes: Secondary | ICD-10-CM

## 2013-01-23 LAB — CBC WITH DIFFERENTIAL/PLATELET
BASO%: 0.8 % (ref 0.0–2.0)
Basophils Absolute: 0.1 10*3/uL (ref 0.0–0.1)
EOS%: 1 % (ref 0.0–7.0)
HCT: 38.1 % — ABNORMAL LOW (ref 38.4–49.9)
HGB: 13.2 g/dL (ref 13.0–17.1)
MCH: 29.2 pg (ref 27.2–33.4)
MCHC: 34.5 g/dL (ref 32.0–36.0)
MONO#: 0.9 10*3/uL (ref 0.1–0.9)
NEUT%: 74.7 % (ref 39.0–75.0)
RDW: 16.6 % — ABNORMAL HIGH (ref 11.0–14.6)
WBC: 6.9 10*3/uL (ref 4.0–10.3)
lymph#: 0.7 10*3/uL — ABNORMAL LOW (ref 0.9–3.3)

## 2013-01-30 ENCOUNTER — Encounter (HOSPITAL_COMMUNITY): Payer: Self-pay

## 2013-01-30 ENCOUNTER — Encounter (HOSPITAL_COMMUNITY)
Admission: RE | Admit: 2013-01-30 | Discharge: 2013-01-30 | Disposition: A | Discharge status: Home / Self Care | Payer: BC Managed Care – PPO | Source: Ambulatory Visit | Attending: Oncology | Admitting: Oncology

## 2013-01-30 DIAGNOSIS — C8593 Non-Hodgkin lymphoma, unspecified, intra-abdominal lymph nodes: Secondary | ICD-10-CM

## 2013-01-30 DIAGNOSIS — C8583 Other specified types of non-Hodgkin lymphoma, intra-abdominal lymph nodes: Secondary | ICD-10-CM | POA: Insufficient documentation

## 2013-01-30 MED ORDER — FLUDEOXYGLUCOSE F - 18 (FDG) INJECTION
19.0000 | Freq: Once | INTRAVENOUS | Status: AC | PRN
  Administered 2013-01-30: 19 via INTRAVENOUS

## 2013-01-31 ENCOUNTER — Encounter (INDEPENDENT_AMBULATORY_CARE_PROVIDER_SITE_OTHER): Payer: BC Managed Care – PPO | Admitting: General Surgery

## 2013-02-01 ENCOUNTER — Encounter (HOSPITAL_COMMUNITY): Payer: BC Managed Care – PPO

## 2013-02-01 ENCOUNTER — Other Ambulatory Visit (HOSPITAL_BASED_OUTPATIENT_CLINIC_OR_DEPARTMENT_OTHER): Payer: BC Managed Care – PPO | Admitting: Lab

## 2013-02-01 ENCOUNTER — Telehealth: Payer: Self-pay | Admitting: Oncology

## 2013-02-01 ENCOUNTER — Encounter: Payer: Self-pay | Admitting: Gastroenterology

## 2013-02-01 ENCOUNTER — Ambulatory Visit (HOSPITAL_BASED_OUTPATIENT_CLINIC_OR_DEPARTMENT_OTHER): Payer: BC Managed Care – PPO | Admitting: Oncology

## 2013-02-01 VITALS — BP 129/78 | HR 72 | Temp 97.6°F | Resp 18 | Ht 71.0 in | Wt 228.1 lb

## 2013-02-01 DIAGNOSIS — C8583 Other specified types of non-Hodgkin lymphoma, intra-abdominal lymph nodes: Secondary | ICD-10-CM

## 2013-02-01 DIAGNOSIS — C8589 Other specified types of non-Hodgkin lymphoma, extranodal and solid organ sites: Secondary | ICD-10-CM

## 2013-02-01 DIAGNOSIS — C8593 Non-Hodgkin lymphoma, unspecified, intra-abdominal lymph nodes: Secondary | ICD-10-CM

## 2013-02-01 LAB — CBC WITH DIFFERENTIAL/PLATELET
Basophils Absolute: 0 10*3/uL (ref 0.0–0.1)
Eosinophils Absolute: 0 10*3/uL (ref 0.0–0.5)
HGB: 12 g/dL — ABNORMAL LOW (ref 13.0–17.1)
MONO#: 0.9 10*3/uL (ref 0.1–0.9)
NEUT#: 3.3 10*3/uL (ref 1.5–6.5)
RBC: 4.17 10*6/uL — ABNORMAL LOW (ref 4.20–5.82)
RDW: 17.2 % — ABNORMAL HIGH (ref 11.0–14.6)
WBC: 5.2 10*3/uL (ref 4.0–10.3)

## 2013-02-01 LAB — LACTATE DEHYDROGENASE (CC13): LDH: 219 U/L (ref 125–245)

## 2013-02-01 NOTE — Progress Notes (Signed)
[facility]    OFFICE PROGRESS NOTE   INTERVAL HISTORY:   He completed a final cycle of CHOP-rituximab on 01/09/2013. He received Neulasta on 01/10/2013. He reports tolerating the chemotherapy well. No fever or syncope a bit following chemotherapy. He has returned to work. Good appetite. No night sweats. A few episodes of swelling around the head.  Objective:  Vital signs in last 24 hours:  Blood pressure 129/78, pulse 72, temperature 97.6 F (36.4 C), temperature source Oral, resp. rate 18, height 5\' 11"  (1.803 m), weight 228 lb 1.6 oz (103.465 kg).    HEENT: No thrush or ulcers, neck without mass Lymphatics: No cervical, supra-clavicular, axillary, or inguinal nodes Resp: Lungs clear bilaterally Cardio: Regular rate and rhythm GI: No hepatosplenomegaly, no mass Vascular: No leg edema   Lab Results:  Lab Results  Component Value Date   WBC 5.2 02/01/2013   HGB 12.0* 02/01/2013   HCT 35.9* 02/01/2013   MCV 86.2 02/01/2013   PLT 269 02/01/2013   ANC 3.3  LDH 219  X-rays: Restaging PET scan on 01/30/2013-compared to 09/05/2012. 3.9 cm prevascular node unchanged from the CT 11/20/2012 an improved from the prior PET scan with an SUV of 3.6 compared to 15.4. Focal hypermetabolism in the distal esophagus with an SUV of 6.5 persists. 3.6 x 7.4 cm jejunal mesentery nodal mass unchanged from the 11/20/2012 CT and markedly improved from the prior PET scan with an SUV of 4.2 compared to a previous measurement of 19.7. I reviewed the images with Mr. Ellenwood and his wife  Medications: I have reviewed the patient's current medications.  Assessment/Plan: 1. Non-Hodgkin's lymphoma-Large abdominal mass surrounding small lymph nodes status post CT-guided biopsy on 07/20/2012 with the pathology suggestive of non-Hodgkin's lymphoma. Status post laparoscopic biopsy of the left abdominal mass on 08/21/2011 with final pathology confirming a large B-cell lymphoma, CD20 positive, with  both follicular and diffuse patterns. Negative staging bone marrow biopsy. Status post cycle 1 CHOP/Rituxan on 09/06/2012. He completed cycle 5 on 12/10/2012 with Neulasta support. He completed cycle 6 with Neulasta support on 01/09/2013 -Restaging PET scan on 01/30/2013-markedly improved metabolic activity associated with a prevascular node and jejunal mesenteric nodal mass compared to the PET scan 01/30/2013, stable prevascular node and jejunal nodal mass compared to the CT from 11/20/2012 2. Nausea/vomiting following cycle 1 CHOP/Rituxan. Antiemetic regimen was adjusted with cycle 2 to include Aloxi, Decadron and Emend.  3. Status post Port-A-Cath placement 08/30/2012. Status post Port-A-Cath removal 11/26/2012. 4. Neutropenia following cycle 2 CHOP/Rituxan. Neulasta was added beginning with cycle 3. 5. Hiccups following treatment.  6. "Night sweats "-etiology unclear. Resolved after removal of the Port-A-Cath 7. Hospitalization 11/15/2012 through 11/27/2012 with fever. No source of infection identified. Port-A-Cath was removed on 11/26/2012. He completed a course of IV antibiotics 12/06/2012. 8. Admission with a syncope event on 12/17/2012, day 8 following cycle 5 CHOP-Rituxan  9. fever and neutropenia following cycle 5 CHOP-Rituxan, no source for infection was identified. A PICC was removed  10. Hypermetabolic activity in the distal esophagus on the PET scan 09/05/2012 and 01/30/2013-? Significant  Disposition:  He has completed 6 cycles of CHOP/rituximab. He is in clinical remission from the non-Hodgkin's lymphoma. The persistent enlarged lymph nodes likely represent "treated "lymphoma. We will follow his clinical status and consider a restaging CT in 3-4 months.  The etiology of the hypermetabolic activity in the distal esophagus is unclear. This has persisted onto PET scan. We will make a referral to Dr. Russella Dar  to consider an upper endoscopy.  Mr. Quiroa will return for an office visit, CBC,  and LDH in 3 months.   [name], MD  02/01/2013  5:30 PM

## 2013-02-01 NOTE — Telephone Encounter (Signed)
gv and printed appt sched and avs forpt...Marland KitchenMarland KitchenPt sched to see Dr. Ailene Ravel on 7.9.14 @ 10:45am

## 2013-02-05 ENCOUNTER — Telehealth: Payer: Self-pay | Admitting: Oncology

## 2013-02-05 NOTE — Telephone Encounter (Signed)
Pt called and r/s lab and MD to 9/29 from 05/06/13

## 2013-02-11 ENCOUNTER — Telehealth: Payer: Self-pay | Admitting: Oncology

## 2013-02-11 NOTE — Telephone Encounter (Signed)
I reviewed the 01/30/2013 restaging PET scan with a radiologist. There appears to be a complete metabolic response in the chest and abdominal lymph nodes/mass.

## 2013-02-20 ENCOUNTER — Ambulatory Visit (INDEPENDENT_AMBULATORY_CARE_PROVIDER_SITE_OTHER): Payer: BC Managed Care – PPO | Admitting: Gastroenterology

## 2013-02-20 ENCOUNTER — Encounter: Payer: Self-pay | Admitting: Gastroenterology

## 2013-02-20 VITALS — BP 132/72 | HR 80 | Ht 70.28 in | Wt 235.2 lb

## 2013-02-20 DIAGNOSIS — Z86010 Personal history of colonic polyps: Secondary | ICD-10-CM

## 2013-02-20 DIAGNOSIS — R933 Abnormal findings on diagnostic imaging of other parts of digestive tract: Secondary | ICD-10-CM

## 2013-02-20 NOTE — Progress Notes (Signed)
History of Present Illness: This is a 57 year old male accompanied by his wife. Been treated for intra-abdominal lymphoma and is undergoing 3 PET scans all showing a subtle focus of hypermetabolic activity in the distal esophagus. See below impression from his most recent PET scan in June. He has no gastrointestinal complaints and no symptoms related to his esophagus. Denies weight loss, abdominal pain, constipation, diarrhea, change in stool caliber, melena, hematochezia, nausea, vomiting, dysphagia, reflux symptoms, chest pain.  June PET Scan impression:  Interval response to therapy.  3.9 x 1.6 cm prevascular node, max SUV 3.6 (previously max SUV 15.4).  3.6 x 7.4 cm jejunal mesenteric nodal mass, max SUV 4.2 (previously max SUV 19.7).  Persistent focal hypermetabolism in the distal esophagus, max SUV. 6.5. Endoscopic correlation is suggested.   Current Medications, Allergies, Past Medical History, Past Surgical History, Family History and Social History were reviewed in [employer] record.  Physical Exam: General: Well developed , well nourished, no acute distress Head: Normocephalic and atraumatic Eyes:  sclerae anicteric, EOMI Ears: Normal auditory acuity Mouth: No deformity or lesions Lungs: Clear throughout to auscultation Heart: Regular rate and rhythm; no murmurs, rubs or bruits Abdomen: Soft, non tender and non distended. No masses, hepatosplenomegaly or hernias noted. Normal Bowel sounds Musculoskeletal: Symmetrical with no gross deformities  Pulses:  Normal pulses noted Extremities: No clubbing, cyanosis, edema or deformities noted Neurological: Alert oriented x 4, grossly nonfocal Psychological:  Alert and cooperative. Normal mood and affect  Assessment and Recommendations:  1. Persistent focal hypermetabolic activity in distal esophagus on PET. R/O esophagitis, neoplasm, etc. Schedule upper endoscopy. This will evaluate for lesions within the lumen  of the esophagus but may not see lesions in the esophageal wall or lesions on the outside of the esophagus. The risks, benefits, and alternatives to endoscopy with possible biopsy and possible dilation were discussed with the patient and they consent to proceed.   2. Personal history of adenomatous colon polyps. Surveillance colonoscopy due in 07/2015.  3. History of hemorrhoids treated at [facility]. Patient advised that we offer a hemorrhoid therapy in our office if he has recurrent symptoms.

## 2013-02-20 NOTE — Patient Instructions (Addendum)
You have been scheduled for an endoscopy with propofol. Please follow written instructions given to you at your visit today. If you use inhalers (even only as needed), please bring them with you on the day of your procedure. Your physician has requested that you go to www.startemmi.com and enter the access code given to you at your visit today. This web site gives a general overview about your procedure. However, you should still follow specific instructions given to you by our office regarding your preparation for the procedure.  You will be due for a recall colonoscopy in 07/2015. We will send you a reminder in the mail when it gets closer to that time.  Thank you for choosing me and [facility].  Venita Lick. Pleas Koch., MD., Clementeen Graham  cc: [name], MD, [name], MD

## 2013-02-26 ENCOUNTER — Encounter: Payer: Self-pay | Admitting: Gastroenterology

## 2013-02-26 ENCOUNTER — Ambulatory Visit (AMBULATORY_SURGERY_CENTER): Payer: BC Managed Care – PPO | Admitting: Gastroenterology

## 2013-02-26 VITALS — BP 128/76 | HR 64 | Temp 97.4°F | Resp 13 | Ht 70.0 in | Wt 235.0 lb

## 2013-02-26 DIAGNOSIS — K209 Esophagitis, unspecified without bleeding: Secondary | ICD-10-CM

## 2013-02-26 DIAGNOSIS — R933 Abnormal findings on diagnostic imaging of other parts of digestive tract: Secondary | ICD-10-CM

## 2013-02-26 MED ORDER — SODIUM CHLORIDE 0.9 % IV SOLN: 500.0000 mL | INTRAVENOUS | Status: DC

## 2013-02-26 MED ORDER — OMEPRAZOLE 20 MG PO CPDR: 20.0000 mg | DELAYED_RELEASE_CAPSULE | Freq: Every day | ORAL | Status: DC

## 2013-02-26 NOTE — Op Note (Signed)
[facility] [address] [place], [address]   ENDOSCOPY PROCEDURE REPORT  PATIENT: Adam Alvarado, Adam Alvarado  MR#: [id number] BIRTHDATE: Sep 26, 1955 , 57  yrs. old GENDER: Male ENDOSCOPIST: Meryl Dare, MD, Clementeen Graham REFERRED BY:  Mardelle Matte, M.D. PROCEDURE DATE:  02/26/2013 PROCEDURE:  EGD, diagnostic ASA CLASS:     Class II INDICATIONS:  abnormal PET scan of the GI tract-hypermetabolic activity in the distal esophagus MEDICATIONS: MAC sedation, administered by CRNA and propofol (Diprivan) 100mg  IV TOPICAL ANESTHETIC: none DESCRIPTION OF PROCEDURE: After the risks benefits and alternatives of the procedure were thoroughly explained, informed consent was obtained.  The LB [id number] endoscope was introduced through the mouth and advanced to the second portion of the duodenum  without limitations.  The instrument was slowly withdrawn as the mucosa was fully examined.  ESOPHAGUS: There was LA Class A esophagitis noted.  The esophagus was otherwise normal. STOMACH: The mucosa and folds of the stomach appeared normal. DUODENUM: The duodenal mucosa showed no abnormalities in the bulb and second portion of the duodenum.  Retroflexed views revealed no abnormalities.  The scope was then withdrawn from the patient and the procedure completed.  COMPLICATIONS: There were no complications.  ENDOSCOPIC IMPRESSION: 1.   LA Class A esophagitis 2.   The EGD was otherwise normal  RECOMMENDATIONS: 1.  Anti-reflux regimen long term 2.  PPI qam long term: omeprazole 20 mg po qam, 1 year of refills   eSigned:  Meryl Dare, MD, [facility] 02/26/2013 3:05 PM

## 2013-02-26 NOTE — Progress Notes (Signed)
Lidocaine-40mg IV prior to Propofol InductionPropofol given over incremental dosages 

## 2013-02-26 NOTE — Progress Notes (Signed)
Patient did not experience any of the following events: a burn prior to discharge; a fall within the facility; wrong site/side/patient/procedure/implant event; or a hospital transfer or hospital admission upon discharge from the facility. (G8907) Patient did not have preoperative order for IV antibiotic SSI prophylaxis. (G8918)  

## 2013-02-26 NOTE — Patient Instructions (Addendum)
YOU HAD AN ENDOSCOPIC PROCEDURE TODAY AT THE [facility]: Refer to the procedure report that was given to you for any specific questions about what was found during the examination.  If the procedure report does not answer your questions, please call your gastroenterologist to clarify.  If you requested that your care partner not be given the details of your procedure findings, then the procedure report has been included in a sealed envelope for you to review at your convenience later.  YOU SHOULD EXPECT: Some feelings of bloating in the abdomen. Passage of more gas than usual.  Walking can help get rid of the air that was put into your GI tract during the procedure and reduce the bloating. If you had a lower endoscopy (such as a colonoscopy or flexible sigmoidoscopy) you may notice spotting of blood in your stool or on the toilet paper. If you underwent a bowel prep for your procedure, then you may not have a normal bowel movement for a few days.  DIET: Your first meal following the procedure should be a light meal and then it is ok to progress to your normal diet.  A half-sandwich or bowl of soup is an example of a good first meal.  Heavy or fried foods are harder to digest and may make you feel nauseous or bloated.  Likewise meals heavy in dairy and vegetables can cause extra gas to form and this can also increase the bloating.  Drink plenty of fluids but you should avoid alcoholic beverages for 24 hours.  ACTIVITY: Your care partner should take you home directly after the procedure.  You should plan to take it easy, moving slowly for the rest of the day.  You can resume normal activity the day after the procedure however you should NOT DRIVE or use heavy machinery for 24 hours (because of the sedation medicines used during the test).    SYMPTOMS TO REPORT IMMEDIATELY: A gastroenterologist can be reached at any hour.  During normal business hours, 8:30 AM to 5:00 PM Monday through Friday,  call [phone].  After hours and on weekends, please call the GI answering service at [phone] who will take a message and have the physician on call contact you.    Following upper endoscopy (EGD)  Vomiting of blood or coffee ground material  New chest pain or pain under the shoulder blades  Painful or persistently difficult swallowing  New shortness of breath  Fever of 100F or higher  Black, tarry-looking stools  FOLLOW UP: If any biopsies were taken you will be contacted by phone or by letter within the next 1-3 weeks.  Call your gastroenterologist if you have not heard about the biopsies in 3 weeks.  Our staff will call the home number listed on your records the next business day following your procedure to check on you and address any questions or concerns that you may have at that time regarding the information given to you following your procedure. This is a courtesy call and so if there is no answer at the home number and we have not heard from you through the emergency physician on call, we will assume that you have returned to your regular daily activities without incident.  SIGNATURES/CONFIDENTIALITY: You and/or your care partner have signed paperwork which will be entered into your electronic medical record.  These signatures attest to the fact that that the information above on your After Visit Summary has been reviewed and is understood.  Full   responsibility of the confidentiality of this discharge information lies with you and/or your care-partner.   Resume medications. Information given on esophagitis with discharge instructions. 

## 2013-02-27 ENCOUNTER — Telehealth: Payer: Self-pay | Admitting: *Deleted

## 2013-02-27 NOTE — Telephone Encounter (Signed)
  Follow up Call-  Call back number 02/26/2013  Post procedure Call Back phone  # [phone]  Permission to leave phone message Yes     Patient questions:  Do you have a fever, pain , or abdominal swelling? no Pain Score  0 *  Have you tolerated food without any problems? yes  Have you been able to return to your normal activities? yes  Do you have any questions about your discharge instructions: Diet   no Medications  no Follow up visit  no  Do you have questions or concerns about your Care? no  Actions: * If pain score is 4 or above: No action needed, pain <4.

## 2013-02-28 NOTE — Telephone Encounter (Signed)
No answer. Left message to call if questions or concerns. 

## 2013-04-03 ENCOUNTER — Telehealth: Payer: Self-pay | Admitting: Gastroenterology

## 2013-04-04 MED ORDER — ESOMEPRAZOLE MAGNESIUM 40 MG PO CPDR: 40.0000 mg | DELAYED_RELEASE_CAPSULE | Freq: Every day | ORAL | Status: DC

## 2013-04-04 NOTE — Telephone Encounter (Signed)
Told patient we can send him in another PPI in place of omeprazole but to call us back if his diarrhea does not resolve or fails to improve. Pt agreed and verbalized understanding.

## 2013-04-05 ENCOUNTER — Telehealth: Payer: Self-pay | Admitting: Internal Medicine

## 2013-04-05 MED ORDER — PANTOPRAZOLE SODIUM 40 MG PO TBEC: 40.0000 mg | DELAYED_RELEASE_TABLET | Freq: Every day | ORAL | Status: DC

## 2013-04-05 NOTE — Telephone Encounter (Signed)
Got message by mistake - Dr. Russella Dar patient

## 2013-04-05 NOTE — Telephone Encounter (Addendum)
Will send another PPI in Nexium's place. Not sure what patients formulary is on his insurance. Attempted to call patient to notify that we are sending in another medication but there was no answer and no voicemail.

## 2013-04-05 NOTE — Addendum Note (Signed)
Addended by: Jessee Avers on: 04/05/2013 04:18 PM   Modules accepted: Orders, Medications

## 2013-05-06 ENCOUNTER — Ambulatory Visit: Payer: BC Managed Care – PPO | Admitting: Oncology

## 2013-05-06 ENCOUNTER — Other Ambulatory Visit: Payer: BC Managed Care – PPO | Admitting: Lab

## 2013-05-13 ENCOUNTER — Other Ambulatory Visit (HOSPITAL_BASED_OUTPATIENT_CLINIC_OR_DEPARTMENT_OTHER): Payer: BC Managed Care – PPO | Admitting: Lab

## 2013-05-13 ENCOUNTER — Telehealth: Payer: Self-pay | Admitting: Oncology

## 2013-05-13 ENCOUNTER — Ambulatory Visit (HOSPITAL_BASED_OUTPATIENT_CLINIC_OR_DEPARTMENT_OTHER): Payer: BC Managed Care – PPO | Admitting: Oncology

## 2013-05-13 VITALS — BP 152/72 | HR 73 | Temp 98.3°F | Resp 19 | Ht 70.0 in | Wt 241.9 lb

## 2013-05-13 DIAGNOSIS — C8583 Other specified types of non-Hodgkin lymphoma, intra-abdominal lymph nodes: Secondary | ICD-10-CM

## 2013-05-13 DIAGNOSIS — C8593 Non-Hodgkin lymphoma, unspecified, intra-abdominal lymph nodes: Secondary | ICD-10-CM

## 2013-05-13 DIAGNOSIS — R110 Nausea: Secondary | ICD-10-CM

## 2013-05-13 DIAGNOSIS — D709 Neutropenia, unspecified: Secondary | ICD-10-CM

## 2013-05-13 DIAGNOSIS — R61 Generalized hyperhidrosis: Secondary | ICD-10-CM

## 2013-05-13 LAB — CBC WITH DIFFERENTIAL/PLATELET
BASO%: 0.7 % (ref 0.0–2.0)
Basophils Absolute: 0 10*3/uL (ref 0.0–0.1)
HCT: 42.9 % (ref 38.4–49.9)
HGB: 14.4 g/dL (ref 13.0–17.1)
LYMPH%: 17.7 % (ref 14.0–49.0)
MCHC: 33.7 g/dL (ref 32.0–36.0)
MONO#: 0.4 10*3/uL (ref 0.1–0.9)
NEUT%: 68.2 % (ref 39.0–75.0)
Platelets: 191 10*3/uL (ref 140–400)
WBC: 4.5 10*3/uL (ref 4.0–10.3)

## 2013-05-13 NOTE — Progress Notes (Signed)
   [facility]    OFFICE PROGRESS NOTE   INTERVAL HISTORY:   He returns as scheduled. He feels well. No fever, night sweats, or anorexia. He is working. He reports an intermittent nonproductive cough. This occurs involuntarily and happens every few days. No shortness of breath. He has decreased stamina, but this is improving.  He had an episode of bilateral ankle swelling recently that resolved. No leg swelling.  He underwent an upper endoscopy on 02/26/2013 to evaluate the PET scan finding. This revealed esophagitis. No other significant finding. He was started on Prilosec.  Objective:  Vital signs in last 24 hours:  Blood pressure 152/72, pulse 73, temperature 98.3 F (36.8 C), temperature source Oral, resp. rate 19, height 5\' 10"  (1.778 m), weight 241 lb 14.4 oz (109.725 kg).    HEENT: Neck without mass Lymphatics: No cervical, supraclavicular, axillary, or inguinal nodes Resp: Lungs clear bilaterally. No wheezing. Cardio: Regular rate and rhythm GI: No hepatosplenomegaly, no mass Vascular: No leg or ankle edema     Lab Results:  Lab Results  Component Value Date   WBC 4.5 05/13/2013   HGB 14.4 05/13/2013   HCT 42.9 05/13/2013   MCV 86.5 05/13/2013   PLT 191 05/13/2013      Medications: I have reviewed the patient's current medications.  Assessment/Plan: 1. Non-Hodgkin's lymphoma-Large abdominal mass surrounding small lymph nodes status post CT-guided biopsy on 07/20/2012 with the pathology suggestive of non-Hodgkin's lymphoma. Status post laparoscopic biopsy of the left abdominal mass on 08/21/2011 with final pathology confirming a large B-cell lymphoma, CD20 positive, with both follicular and diffuse patterns. Negative staging bone marrow biopsy. Status post cycle 1 CHOP/Rituxan on 09/06/2012. He completed cycle 5 on 12/10/2012 with Neulasta support. He completed cycle 6 with Neulasta support on 01/09/2013 -Restaging PET scan on 01/30/2013-markedly  improved metabolic activity associated with a prevascular node and jejunal mesenteric nodal mass compared to the PET scan 09/05/2012, stable prevascular node and jejunal nodal mass compared to the CT from 11/20/2012 . Review of the 01/30/2013 PET scan in radiology was consistent with a complete hypermetabolic response 2. Nausea/vomiting following cycle 1 CHOP/Rituxan. Antiemetic regimen was adjusted with cycle 2 to include Aloxi, Decadron and Emend.  3. Status post Port-A-Cath placement 08/30/2012. Status post Port-A-Cath removal 11/26/2012. 4. Neutropenia following cycle 2 CHOP/Rituxan. Neulasta was added beginning with cycle 3. 5. Hiccups following treatment.  6. "Night sweats "-etiology unclear. Resolved after removal of the Port-A-Cath 7. Hospitalization 11/15/2012 through 11/27/2012 with fever. No source of infection identified. Port-A-Cath was removed on 11/26/2012. He completed a course of IV antibiotics 12/06/2012. 8. Admission with a syncope event on 12/17/2012, day 8 following cycle 5 CHOP-Rituxan  9. fever and neutropenia following cycle 5 CHOP-Rituxan, no source for infection was identified. A PICC was removed  10. Hypermetabolic activity in the distal esophagus on the PET scan 09/05/2012 and 01/30/2013-? Significant , upper endoscopy 02/26/2013 revealed esophagitis and no mass. He is now taking Protonix.  Disposition:  Mr. Lorita Officer remains in clinical remission from the non-Hodgkin's lymphoma. He will contact us for a persistent cough, dyspnea, or new symptoms. He will obtain an influenza vaccine via his primary physician. He is scheduled for a return visit in 4 months.  [name], MD  05/13/2013  9:56 AM

## 2013-05-13 NOTE — Telephone Encounter (Signed)
gv and printed appt sched and avs for pt for Jan 2015 °

## 2013-05-14 ENCOUNTER — Telehealth: Payer: Self-pay | Admitting: Oncology

## 2013-05-14 NOTE — Telephone Encounter (Signed)
Talked to pt's wife gave her appt for MD visit per pt rqst for january 2015

## 2013-05-30 ENCOUNTER — Other Ambulatory Visit: Payer: Self-pay | Admitting: Gastroenterology

## 2013-06-20 ENCOUNTER — Other Ambulatory Visit: Payer: Self-pay

## 2013-08-22 ENCOUNTER — Ambulatory Visit: Payer: BC Managed Care – PPO | Admitting: Oncology

## 2013-09-05 ENCOUNTER — Ambulatory Visit (HOSPITAL_BASED_OUTPATIENT_CLINIC_OR_DEPARTMENT_OTHER): Payer: BC Managed Care – PPO | Admitting: Oncology

## 2013-09-05 ENCOUNTER — Telehealth: Payer: Self-pay | Admitting: Oncology

## 2013-09-05 VITALS — BP 138/74 | HR 62 | Temp 97.8°F | Resp 18 | Ht 70.0 in | Wt 237.4 lb

## 2013-09-05 DIAGNOSIS — D709 Neutropenia, unspecified: Secondary | ICD-10-CM

## 2013-09-05 DIAGNOSIS — C8593 Non-Hodgkin lymphoma, unspecified, intra-abdominal lymph nodes: Secondary | ICD-10-CM

## 2013-09-05 DIAGNOSIS — R112 Nausea with vomiting, unspecified: Secondary | ICD-10-CM

## 2013-09-05 DIAGNOSIS — C8583 Other specified types of non-Hodgkin lymphoma, intra-abdominal lymph nodes: Secondary | ICD-10-CM

## 2013-09-05 NOTE — Telephone Encounter (Signed)
gv and printed appt sched and avs for pt for May °

## 2013-09-05 NOTE — Progress Notes (Signed)
   [place]    OFFICE PROGRESS NOTE   INTERVAL HISTORY:   He returns as scheduled. He feels well. No fever. Good energy level. No abdominal mass. He received an influenza vaccine.  Objective:  Vital signs in last 24 hours:  Blood pressure 138/74, pulse 62, temperature 97.8 F (36.6 C), temperature source Oral, resp. rate 18, height $RemoveBe'5\' 10"'VEyNVCWgt$  (1.778 m), weight 237 lb 6.4 oz (107.684 kg).    HEENT: Neck without mass Lymphatics: No cervical, supraclavicular, axillary, or inguinal nodes Resp: Lungs clear bilaterally Cardio: Regular rate and rhythm GI: No hepatosplenomegaly, nontender, no mass Vascular: No leg edema  Lab Results:  None today   Medications: I have reviewed the patient's current medications.  Assessment/Plan: 1. Non-Hodgkin's lymphoma-Large abdominal mass surrounding small lymph nodes status post CT-guided biopsy on 07/20/2012 with the pathology suggestive of non-Hodgkin's lymphoma. Status post laparoscopic biopsy of the left abdominal mass on 08/21/2011 with final pathology confirming a large B-cell lymphoma, CD20 positive, with both follicular and diffuse patterns. Negative staging bone marrow biopsy. Status post cycle 1 CHOP/Rituxan on 09/06/2012. He completed cycle 5 on 12/10/2012 with Neulasta support. He completed cycle 6 with Neulasta support on 01/09/2013 -Restaging PET scan on 32/07/2481-NOIBBCWU improved metabolic activity associated with a prevascular node and jejunal mesenteric nodal mass compared to the PET scan 09/05/2012, stable prevascular node and jejunal nodal mass compared to the CT from 11/20/2012 . Review of the 01/30/2013 PET scan in radiology was consistent with a complete hypermetabolic response  2. Nausea/vomiting following cycle 1 CHOP/Rituxan. Antiemetic regimen was adjusted with cycle 2 to include Aloxi, Decadron and Emend.  3. Status post Port-A-Cath placement 08/30/2012. Status post Port-A-Cath removal  11/26/2012. 4. Neutropenia following cycle 2 CHOP/Rituxan. Neulasta was added beginning with cycle 3. 5. Hiccups following treatment.  6. "Night sweats "-etiology unclear. Resolved after removal of the Port-A-Cath 7. Hospitalization 11/15/2012 through 11/27/2012 with fever. No source of infection identified. Port-A-Cath was removed on 11/26/2012. He completed a course of IV antibiotics 12/06/2012. 8. Admission with a syncope event on 12/17/2012, day 8 following cycle 5 CHOP-Rituxan  9. fever and neutropenia following cycle 5 CHOP-Rituxan, no source for infection was identified. A PICC was removed  10. Hypermetabolic activity in the distal esophagus on the PET scan 09/05/2012 and 01/30/2013-? Significant , upper endoscopy 02/26/2013 revealed esophagitis and no mass. He is now taking Protonix.    Disposition:  Mr. Brander is in clinical remission from non-Hodgkin's lymphoma. He will return for an office visit in 4 months. He will contact us in the interim for new symptoms.  Betsy Coder, MD  09/05/2013  11:33 AM

## 2013-12-04 ENCOUNTER — Telehealth: Payer: Self-pay | Admitting: *Deleted

## 2013-12-04 NOTE — Telephone Encounter (Signed)
Message from pt's wife reporting he is losing body hair rapidly. Pt has seen PCP who ruled out thyroid issue and anemia as cause. Requesting visit to see Dr. Benay Spice ASAP to evaluate hair loss. Wife reports he is being scheduled to see dermatologist.

## 2013-12-04 NOTE — Telephone Encounter (Signed)
Reviewed earlier call with Dr. Benay Spice. Hair loss at this point is not likely related to his lymphoma or chemo. See dermatologist, will work in if derm determines this is cancer related. Pt's wife made aware- voiced understanding. Pt is scheduled to see dermatologist 4/23.

## 2014-01-09 ENCOUNTER — Encounter: Payer: Self-pay | Admitting: Oncology

## 2014-01-09 ENCOUNTER — Ambulatory Visit (HOSPITAL_BASED_OUTPATIENT_CLINIC_OR_DEPARTMENT_OTHER): Payer: BC Managed Care – PPO | Admitting: Oncology

## 2014-01-09 ENCOUNTER — Ambulatory Visit (HOSPITAL_BASED_OUTPATIENT_CLINIC_OR_DEPARTMENT_OTHER): Payer: BC Managed Care – PPO

## 2014-01-09 ENCOUNTER — Telehealth: Payer: Self-pay | Admitting: Oncology

## 2014-01-09 VITALS — BP 136/69 | HR 65 | Temp 98.7°F | Resp 18 | Ht 70.0 in | Wt 234.7 lb

## 2014-01-09 DIAGNOSIS — C8593 Non-Hodgkin lymphoma, unspecified, intra-abdominal lymph nodes: Secondary | ICD-10-CM

## 2014-01-09 DIAGNOSIS — C8583 Other specified types of non-Hodgkin lymphoma, intra-abdominal lymph nodes: Secondary | ICD-10-CM

## 2014-01-09 LAB — CBC WITH DIFFERENTIAL/PLATELET
BASO%: 0.6 % (ref 0.0–2.0)
Basophils Absolute: 0 10*3/uL (ref 0.0–0.1)
EOS ABS: 0.1 10*3/uL (ref 0.0–0.5)
EOS%: 1.8 % (ref 0.0–7.0)
HCT: 45.9 % (ref 38.4–49.9)
HGB: 15.1 g/dL (ref 13.0–17.1)
LYMPH%: 18.1 % (ref 14.0–49.0)
MCH: 29.4 pg (ref 27.2–33.4)
MCHC: 32.9 g/dL (ref 32.0–36.0)
MCV: 89.5 fL (ref 79.3–98.0)
MONO#: 0.5 10*3/uL (ref 0.1–0.9)
MONO%: 9.5 % (ref 0.0–14.0)
NEUT%: 70 % (ref 39.0–75.0)
NEUTROS ABS: 3.7 10*3/uL (ref 1.5–6.5)
Platelets: 199 10*3/uL (ref 140–400)
RBC: 5.13 10*6/uL (ref 4.20–5.82)
RDW: 13.7 % (ref 11.0–14.6)
WBC: 5.4 10*3/uL (ref 4.0–10.3)
lymph#: 1 10*3/uL (ref 0.9–3.3)

## 2014-01-09 LAB — COMPREHENSIVE METABOLIC PANEL (CC13)
ALT: 31 U/L (ref 0–55)
ANION GAP: 13 meq/L — AB (ref 3–11)
AST: 24 U/L (ref 5–34)
Albumin: 4.2 g/dL (ref 3.5–5.0)
Alkaline Phosphatase: 81 U/L (ref 40–150)
BUN: 16.7 mg/dL (ref 7.0–26.0)
CO2: 22 meq/L (ref 22–29)
Calcium: 9.1 mg/dL (ref 8.4–10.4)
Chloride: 107 mEq/L (ref 98–109)
Creatinine: 1.3 mg/dL (ref 0.7–1.3)
GLUCOSE: 95 mg/dL (ref 70–140)
Potassium: 4.3 mEq/L (ref 3.5–5.1)
SODIUM: 143 meq/L (ref 136–145)
TOTAL PROTEIN: 6.7 g/dL (ref 6.4–8.3)
Total Bilirubin: 0.74 mg/dL (ref 0.20–1.20)

## 2014-01-09 LAB — LACTATE DEHYDROGENASE (CC13): LDH: 197 U/L (ref 125–245)

## 2014-01-09 NOTE — Progress Notes (Signed)
[place] OFFICE PROGRESS NOTE   Diagnosis: Non-Hodgkin's lymphoma  INTERVAL HISTORY:   He returns as scheduled. He has developed total body hair loss over the past few months. Mr. Soderquist has been evaluated by his primary physician and a dermatologist for the alopecia. No etiology was found.  He continues to work full-time. He reports malaise. No fever or anorexia. No palpable lymph nodes. No pain. He reports "night sweats "on approximately 4 occasions over the past few months. Objective:  Vital signs in last 24 hours:  Blood pressure 136/69, pulse 65, temperature 98.7 F (37.1 C), temperature source Oral, resp. rate 18, height 5' 10"  (1.778 m), weight 234 lb 11.2 oz (106.459 kg).    HEENT: Neck without mass Lymphatics: No cervical, supraclavicular, axillary, or inguinal nodes Resp: Lungs clear bilaterally Cardio: Regular rate and rhythm GI: No hepatosplenomegaly, nontender, no mass Vascular: No leg edema  Skin: Partial Alopecia at the scalp, trunk, axillary, and groin     Lab Results:  Lab Results  Component Value Date   WBC 4.5 05/13/2013   HGB 14.4 05/13/2013   HCT 42.9 05/13/2013   MCV 86.5 05/13/2013   PLT 191 05/13/2013   NEUTROABS 3.1 05/13/2013     Medications: I have reviewed the patient's current medications.  Assessment/Plan: 1. Non-Hodgkin's lymphoma-Large abdominal mass surrounding small lymph nodes status post CT-guided biopsy on 07/20/2012 with the pathology suggestive of non-Hodgkin's lymphoma. Status post laparoscopic biopsy of the left abdominal mass on 08/21/2011 with final pathology confirming a large B-cell lymphoma, CD20 positive, with both follicular and diffuse patterns. Negative staging bone marrow biopsy. Status post cycle 1 CHOP/Rituxan on 09/06/2012. He completed cycle 5 on 12/10/2012 with Neulasta support. He completed cycle 6 with Neulasta support on 01/09/2013 -Restaging PET scan on 40/98/1191-YNWGNFAO improved metabolic activity  associated with a prevascular node and jejunal mesenteric nodal mass compared to the PET scan 09/05/2012, stable prevascular node and jejunal nodal mass compared to the CT from 11/20/2012 . Review of the 01/30/2013 PET scan in radiology was consistent with a complete hypermetabolic response  2. Nausea/vomiting following cycle 1 CHOP/Rituxan. Antiemetic regimen was adjusted with cycle 2 to include Aloxi, Decadron and Emend.  3. Status post Port-A-Cath placement 08/30/2012. Status post Port-A-Cath removal 11/26/2012. 4. Neutropenia following cycle 2 CHOP/Rituxan. Neulasta was added beginning with cycle 3. 5. Hiccups following treatment.  6. "Night sweats "-etiology unclear. Resolved after removal of the Port-A-Cath 7. Hospitalization 11/15/2012 through 11/27/2012 with fever. No source of infection identified. Port-A-Cath was removed on 11/26/2012. He completed a course of IV antibiotics 12/06/2012. 8. Admission with a syncope event on 12/17/2012, day 8 following cycle 5 CHOP-Rituxan  9. fever and neutropenia following cycle 5 CHOP-Rituxan, no source for infection was identified. A PICC was removed  10. Hypermetabolic activity in the distal esophagus on the PET scan 09/05/2012 and 01/30/2013-? Significant , upper endoscopy 02/26/2013 revealed esophagitis and no mass. He is now taking Protonix.  11. Alopecia      Disposition:  Mr. Navarette remains in clinical remission from non-Hodgkin. He reports a few night sweats, but there is no other clinical symptom to suggest recurrence of the lymphoma. I doubt the alopecia is related to lymphoma or history last year.  He will contact us for consistent night sweats or new symptoms. He will return for an office visit in 2 months.  We will check a CBC, chemistry panel, LDH, and ANA today. He reports his thyroid function has been checked by Dr. Willey Blade. I  last the [facility] to check into reports of alopecia with rituximab. He will try a different an  acid in case the alopecia is related to Protonix.  Ladell Pier, MD  01/09/2014  9:11 AM

## 2014-01-09 NOTE — Telephone Encounter (Signed)
gv and printed appt sched and avs for pt for July....sent tpt to lab

## 2014-01-10 ENCOUNTER — Telehealth: Payer: Self-pay | Admitting: *Deleted

## 2014-01-10 LAB — ANA: ANA: NEGATIVE

## 2014-01-10 NOTE — Telephone Encounter (Signed)
Per Colletta Maryland, PharmD there are no reported incidences of alopecia related to Rituxan. Spoke with pt's wife, informed her of this. Also in response to MyChart message, Dr. Benay Spice does not recommend any scan. LDH is still within normal range. She voiced understanding.

## 2014-01-10 NOTE — Telephone Encounter (Signed)
See phone note

## 2014-01-28 ENCOUNTER — Encounter: Payer: Self-pay | Admitting: Oncology

## 2014-01-30 ENCOUNTER — Other Ambulatory Visit: Payer: Self-pay | Admitting: *Deleted

## 2014-01-30 ENCOUNTER — Encounter: Payer: Self-pay | Admitting: *Deleted

## 2014-01-30 DIAGNOSIS — C8593 Non-Hodgkin lymphoma, unspecified, intra-abdominal lymph nodes: Secondary | ICD-10-CM

## 2014-02-10 ENCOUNTER — Ambulatory Visit (HOSPITAL_COMMUNITY)
Admission: RE | Admit: 2014-02-10 | Discharge: 2014-02-10 | Disposition: A | Discharge status: Home / Self Care | Payer: BC Managed Care – PPO | Source: Ambulatory Visit | Attending: Oncology | Admitting: Oncology

## 2014-02-10 DIAGNOSIS — C8593 Non-Hodgkin lymphoma, unspecified, intra-abdominal lymph nodes: Secondary | ICD-10-CM

## 2014-02-10 DIAGNOSIS — C8589 Other specified types of non-Hodgkin lymphoma, extranodal and solid organ sites: Secondary | ICD-10-CM | POA: Insufficient documentation

## 2014-02-10 DIAGNOSIS — K449 Diaphragmatic hernia without obstruction or gangrene: Secondary | ICD-10-CM | POA: Insufficient documentation

## 2014-02-10 LAB — GLUCOSE, CAPILLARY: Glucose-Capillary: 101 mg/dL — ABNORMAL HIGH (ref 70–99)

## 2014-02-10 MED ORDER — FLUDEOXYGLUCOSE F - 18 (FDG) INJECTION
10.7000 | Freq: Once | INTRAVENOUS | Status: AC | PRN
  Administered 2014-02-10: 10.7 via INTRAVENOUS

## 2014-02-11 ENCOUNTER — Ambulatory Visit (HOSPITAL_BASED_OUTPATIENT_CLINIC_OR_DEPARTMENT_OTHER): Payer: BC Managed Care – PPO | Admitting: Oncology

## 2014-02-11 ENCOUNTER — Telehealth: Payer: Self-pay | Admitting: Oncology

## 2014-02-11 VITALS — BP 128/66 | HR 60 | Temp 98.6°F | Resp 20 | Ht 70.0 in | Wt 236.6 lb

## 2014-02-11 DIAGNOSIS — C8593 Non-Hodgkin lymphoma, unspecified, intra-abdominal lymph nodes: Secondary | ICD-10-CM

## 2014-02-11 DIAGNOSIS — C8583 Other specified types of non-Hodgkin lymphoma, intra-abdominal lymph nodes: Secondary | ICD-10-CM

## 2014-02-11 DIAGNOSIS — R404 Transient alteration of awareness: Secondary | ICD-10-CM

## 2014-02-11 NOTE — Progress Notes (Signed)
[place] OFFICE PROGRESS NOTE   Diagnosis:  non-Hodgkin's lymphoma  INTERVAL HISTORY:   Adam Alvarado returns as scheduled. He continues to have malaise and somnolence. He is working full-time and returned from a vacation to [place] [place] last week. No fever or night sweats. No palpable lymph nodes. He continues to lose total body hair.   Objective:  Vital signs in last 24 hours:  Blood pressure 128/66, pulse 60, temperature 98.6 F (37 C), temperature source Oral, resp. rate 20, height 5' 10"  (1.778 m), weight 236 lb 9.6 oz (107.321 kg).    HEENT:  neck without mass Lymphatics:  no cervical, supraclavicular, axillary, or inguinal nodes  Resp:  lungs clear bilaterally  Cardio:  regular rate and rhythm  GI:  no hepatosplenomegaly, nontender, no mass  Vascular:  no leg edema  Skin: alopecia over the scalp, trunk, and extremities      Lab Results:  Lab Results  Component Value Date   WBC 5.4 01/09/2014   HGB 15.1 01/09/2014   HCT 45.9 01/09/2014   MCV 89.5 01/09/2014   PLT 199 01/09/2014   NEUTROABS 3.7 01/09/2014   LDH 197 ANA-negative  Imaging:  Nm Pet Image Restag (ps) Skull Base To Thigh  02/10/2014   CLINICAL DATA:  Subsequent treatment strategy for non-Hodgkin's lymphoma.  EXAM: NUCLEAR MEDICINE PET SKULL BASE TO THIGH  TECHNIQUE: 10.7 mCi F-18 FDG was injected intravenously. Full-ring PET imaging was performed from the skull base to thigh after the radiotracer. CT data was obtained and used for attenuation correction and anatomic localization.  FASTING BLOOD GLUCOSE:  Value: 101 mg/dl  COMPARISON:  01/30/2013  FINDINGS: NECK  No hypermetabolic lymph nodes in the neck.  CHEST  Evaluation of lung parenchyma is constrained by respiratory motion. No suspicious pulmonary nodules are evident on low dose CT.  Heart is top-normal in size.  No pericardial effusion.  10 mm short axis prevascular node (series 4/ image 66), decreased, with associated improving  hypermetabolism, max SUV 2.2 (previously 1.6 cm short axis with max SUV 3.6).  Associated focal hypermetabolism within a small hiatal hernia, max SUV 3.2, previously 6.5.  ABDOMEN/PELVIS  No abnormal hypermetabolic activity within the liver, pancreas, adrenal glands, or spleen.  3.0 x 5.9 cm jejunal mesenteric nodal mass (series 4/ image 121), previously 3.6 x 7.4 cm, with associated improving hypermetabolism, max SUV 2.9 (previously max SUV 4.2).  SKELETON  No focal hypermetabolic activity to suggest skeletal metastasis.  IMPRESSION: Interval partial metabolic response to therapy.  Mild residual hypermetabolism within a prevascular node and a jejunal mesenteric nodal mass, as above.  Mild residual focal hypermetabolism with a small hiatal hernia, nonspecific.   Electronically Signed   By: Julian Hy M.D.   On: 02/10/2014 12:20   I reviewed the PET scan images with Adam Alvarado and his wife.   Medications: I have reviewed the patient's current medications.  Assessment/Plan: 1. Non-Hodgkin's lymphoma-Large abdominal mass surrounding small lymph nodes status post CT-guided biopsy on 07/20/2012 with the pathology suggestive of non-Hodgkin's lymphoma. Status post laparoscopic biopsy of the left abdominal mass on 08/21/2011 with final pathology confirming a large B-cell lymphoma, CD20 positive, with both follicular and diffuse patterns. Negative staging bone marrow biopsy. Status post cycle 1 CHOP/Rituxan on 09/06/2012. He completed cycle 5 on 12/10/2012 with Neulasta support. He completed cycle 6 with Neulasta support on 01/09/2013 -Restaging PET scan on 16/05/9603-[id number] improved metabolic activity associated with a prevascular node and jejunal mesenteric nodal mass compared to the  PET scan 09/05/2012, stable prevascular node and jejunal nodal mass compared to the CT from 11/20/2012 . Review of the 01/30/2013 PET scan in radiology was consistent with a complete hypermetabolic response  -Restaging PET scan  02/10/2014 improvement in size and metabolic activity associated with a residual prevascular lymph node and jejunal mesentery nodal mass  2. Nausea/vomiting following cycle 1 CHOP/Rituxan. Antiemetic regimen was adjusted with cycle 2 to include Aloxi, Decadron and Emend.  3. Status post Port-A-Cath placement 08/30/2012. Status post Port-A-Cath removal 11/26/2012. 4. Neutropenia following cycle 2 CHOP/Rituxan. Neulasta was added beginning with cycle 3. 5. Hiccups following treatment.  6. "Night sweats "-etiology unclear. Resolved after removal of the Port-A-Cath 7. Hospitalization 11/15/2012 through 11/27/2012 with fever. No source of infection identified. Port-A-Cath was removed on 11/26/2012. He completed a course of IV antibiotics 12/06/2012. 8. Admission with a syncope event on 12/17/2012, day 8 following cycle 5 CHOP-Rituxan  9. fever and neutropenia following cycle 5 CHOP-Rituxan, no source for infection was identified. A PICC was removed  10. Hypermetabolic activity in the distal esophagus on the PET scan 09/05/2012 and 01/30/2013-? Significant , upper endoscopy 02/26/2013 revealed esophagitis and no mass. He is now taking Protonix.  residual mild hypermetabolism associated with a hiatal hernia, improved on the PET scan 02/10/2014  11. Alopecia-Total body, etiology unclear    Disposition:   Adam Alvarado remains in clinical remission from non-Hodgkin's lymphoma. I think it is unlikely his malaise and somnolence are related to the lymphoma. He appears to be working long hours and is able to go about his usual activities. The etiology of the alopecia remains unclear. I doubt this is related to the course of chemotherapy completed 1 year ago.  He will continue followup with his primary physician and dermatology for evaluation of the alopecia. Adam Alvarado will return for an office visit here in 3 months.   Betsy Coder, MD  02/11/2014  11:02 AM

## 2014-02-11 NOTE — Telephone Encounter (Signed)
Gave pt appt for Md for october 2015

## 2014-03-04 ENCOUNTER — Ambulatory Visit: Payer: BC Managed Care – PPO | Admitting: Oncology

## 2014-03-17 IMAGING — CR DG CHEST 2V
2 series · 2 of 2 positions shown · non-contrast
Comparison: 12/01/2004

CLINICAL DATA: Loss of consciousness.  Fever.  Lymphoma.

CHEST - 2 VIEW

[w chest pa]
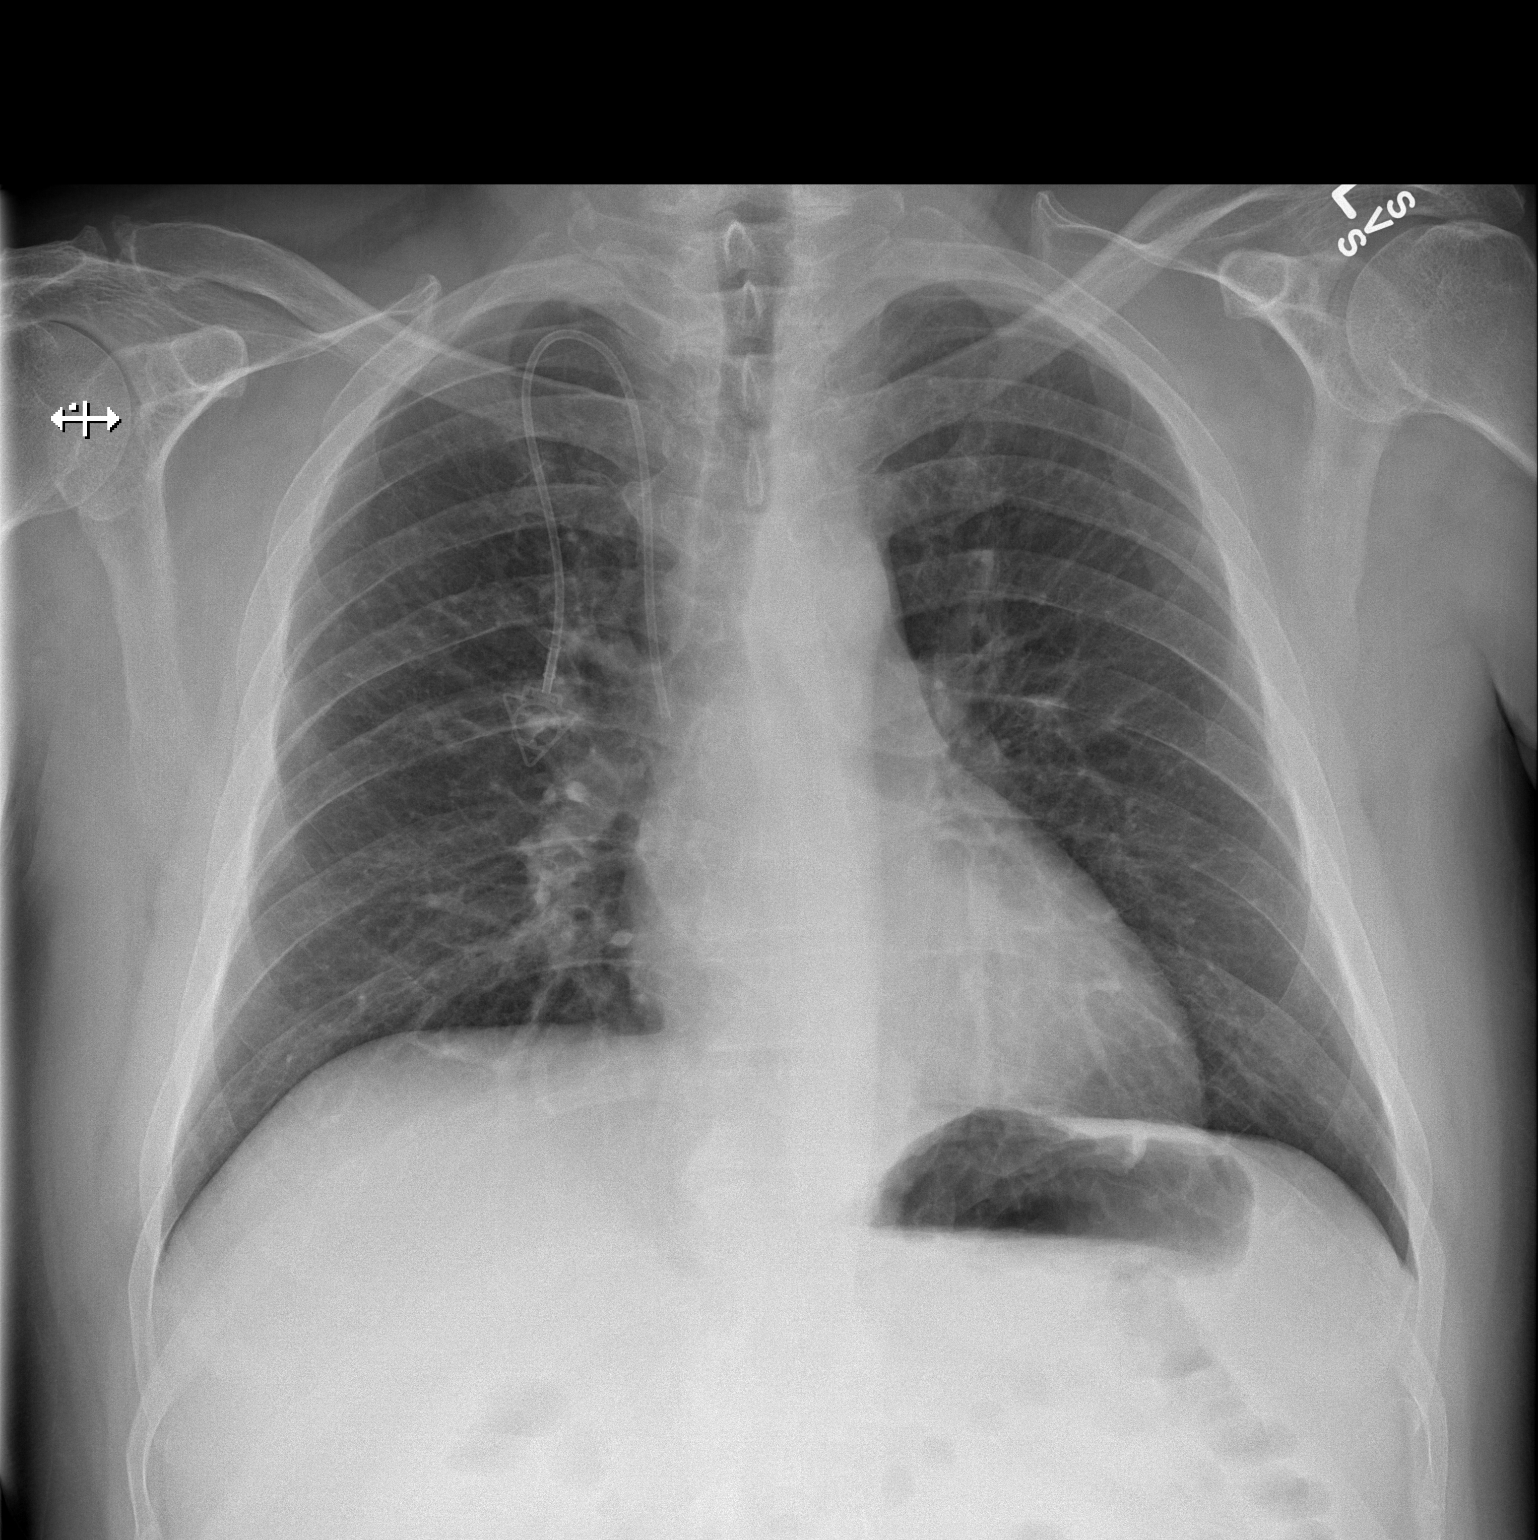

[w chest lat]
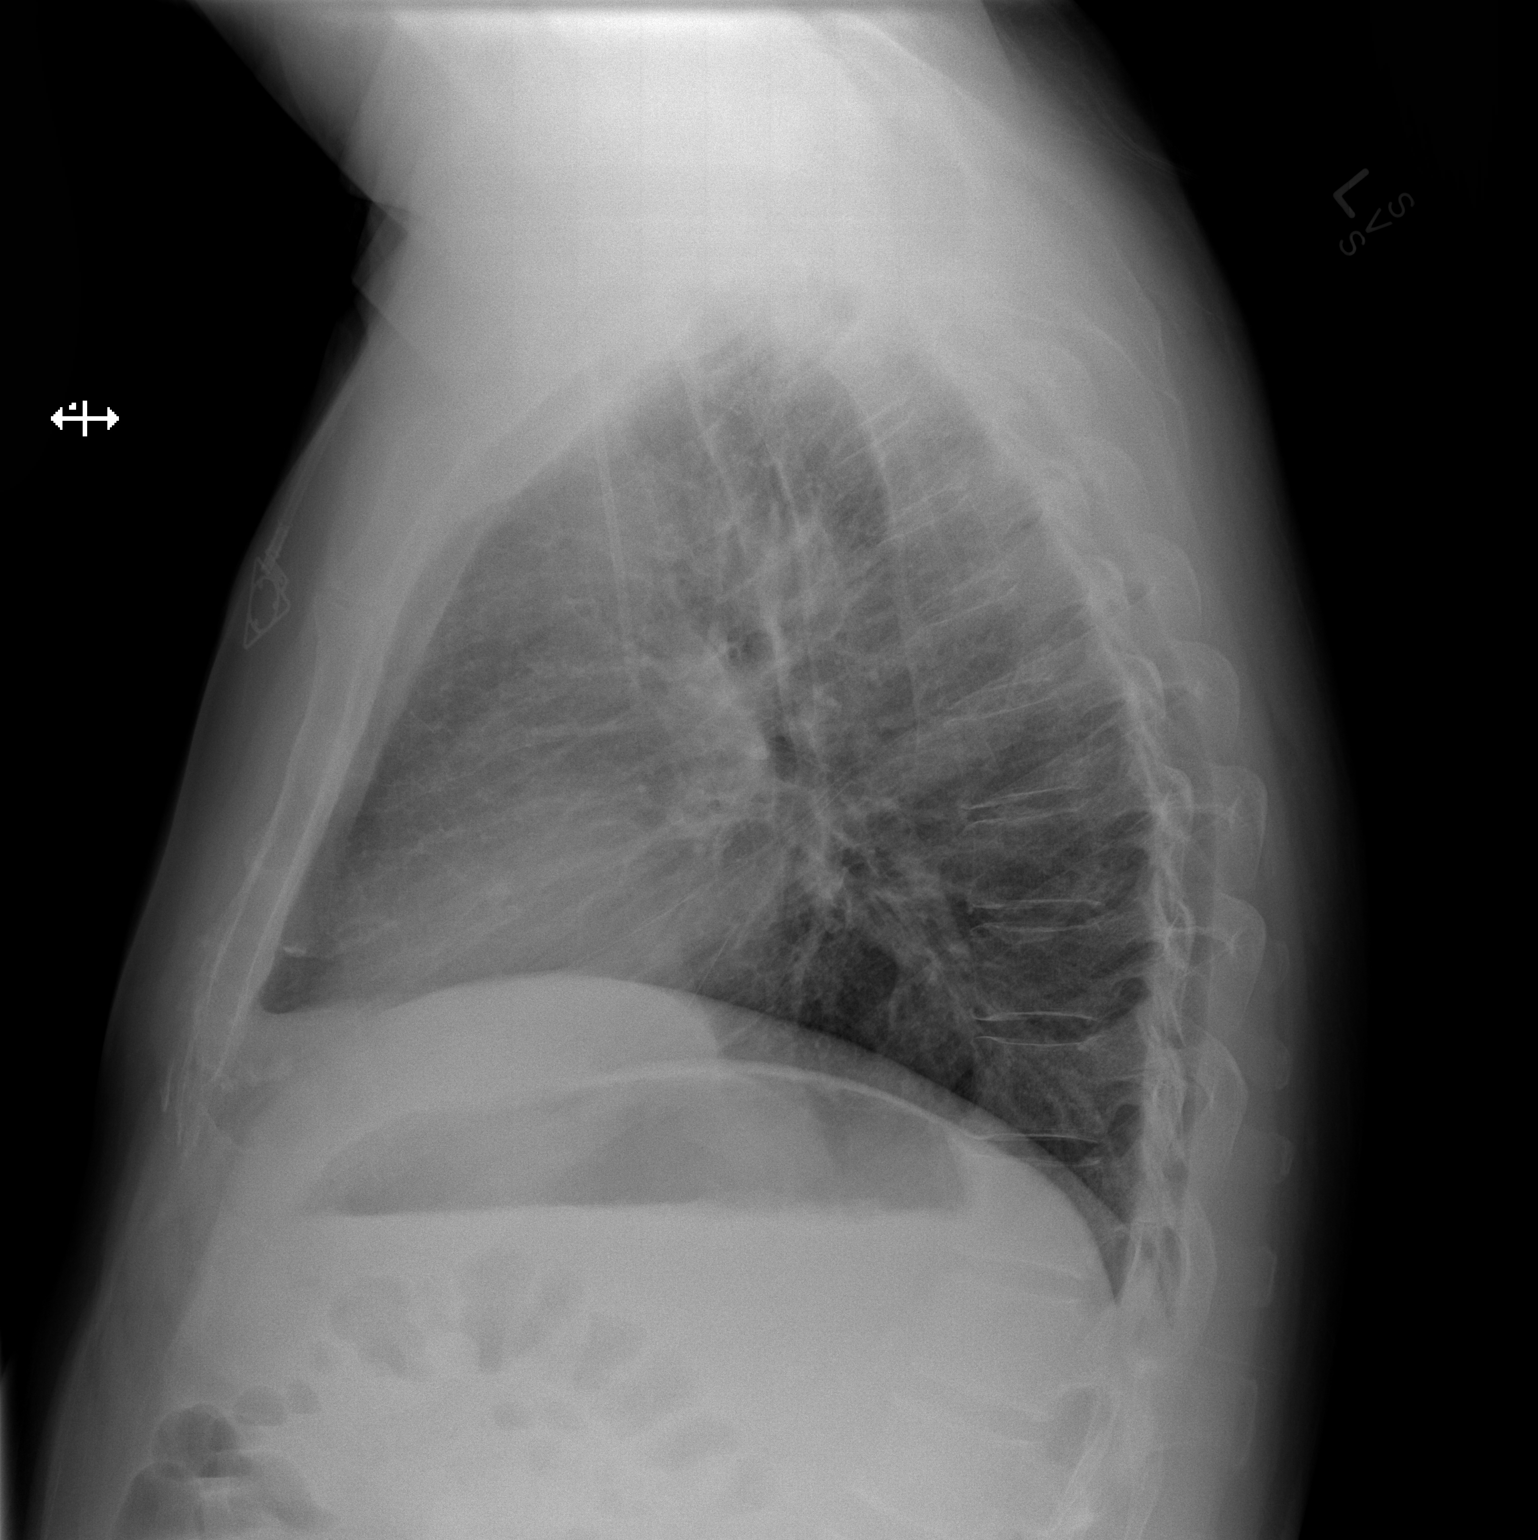

[2 of 2 positions shown; findings below may reference images not displayed]

FINDINGS: Lungs are clear.  Negative for pneumonia or effusion.
Vascularity normal.  Port-A-Cath tip in the SVC. Negative for
adenopathy or mass.
IMPRESSION: No active cardiopulmonary abnormality.  Negative for pneumonia.

## 2014-03-17 IMAGING — CT CT HEAD W/O CM
2 series · 17 of 30 positions shown, 20 images · non-contrast
Comparison: None.

CLINICAL DATA: History of lymphoma.  Syncope.

CT HEAD WITHOUT CONTRAST
TECHNIQUE: Contiguous axial images were obtained from the base of
the skull through the vertex without contrast.

[Series 2: head w/o · axial · non-contrast · 0.48mm/px · z∈[+1002,+1122]mm · 9 of 30 slices shown, 12 images]
[im 3/30  brain]
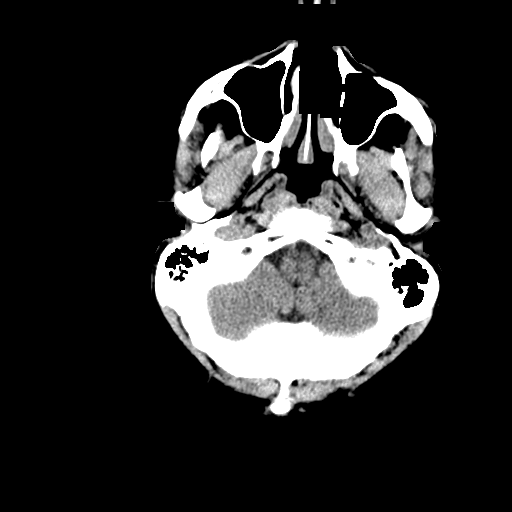
[im 3/30  bone]
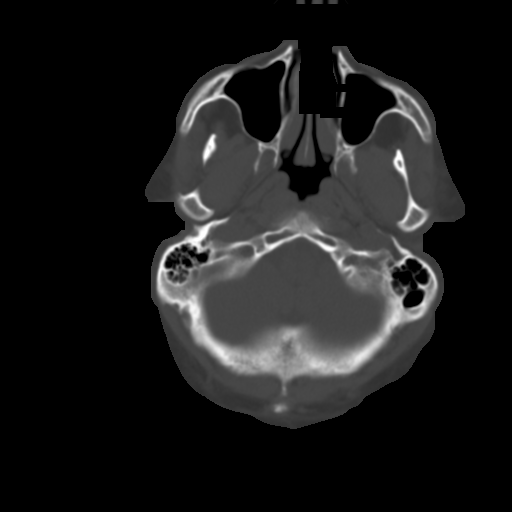
[im 6/30  brain]
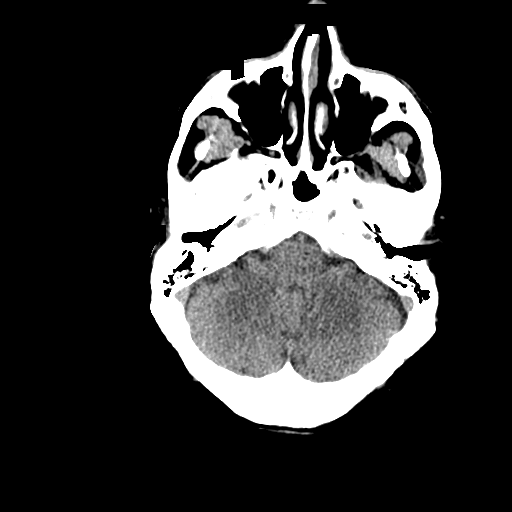
[im 9/30  brain]
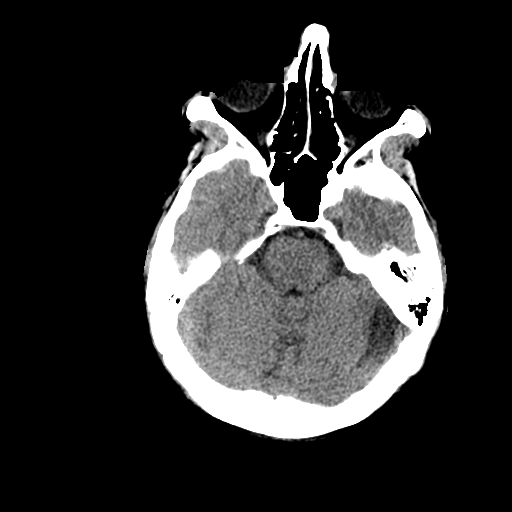
[im 12/30  brain]
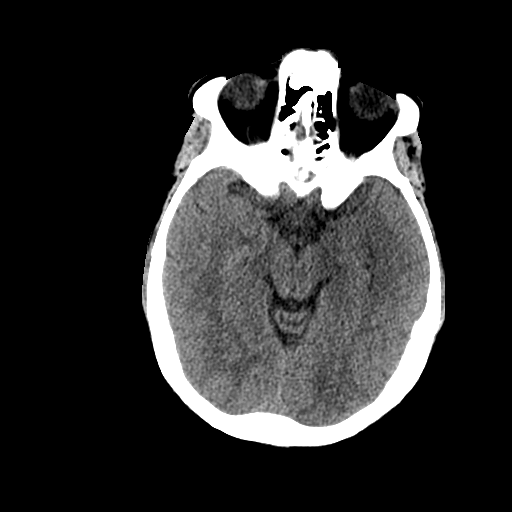
[im 15/30  brain]
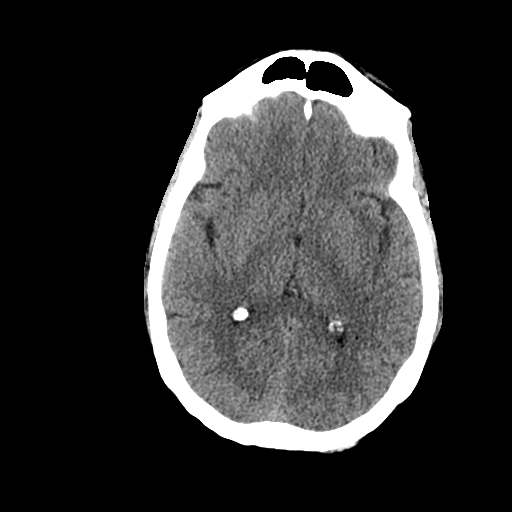
[im 15/30  bone]
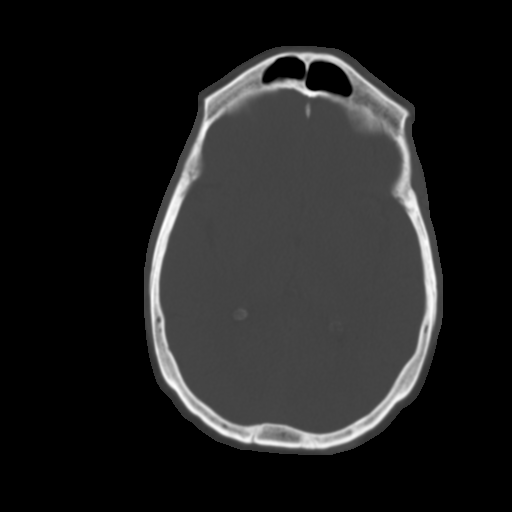
[im 18/30  brain]
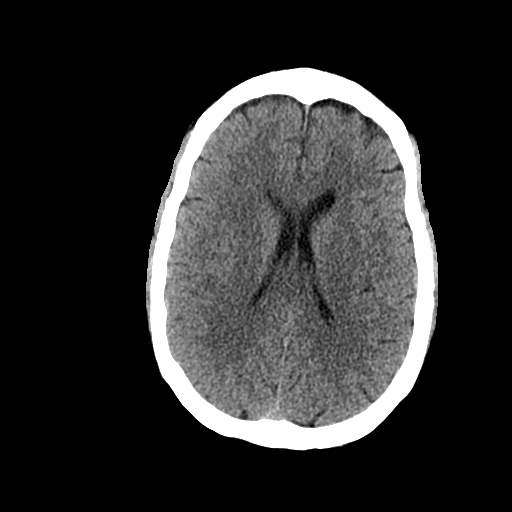
[im 21/30  brain]
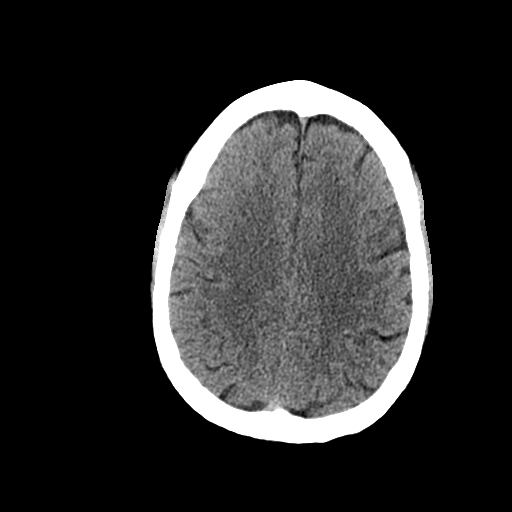
[im 24/30  brain]
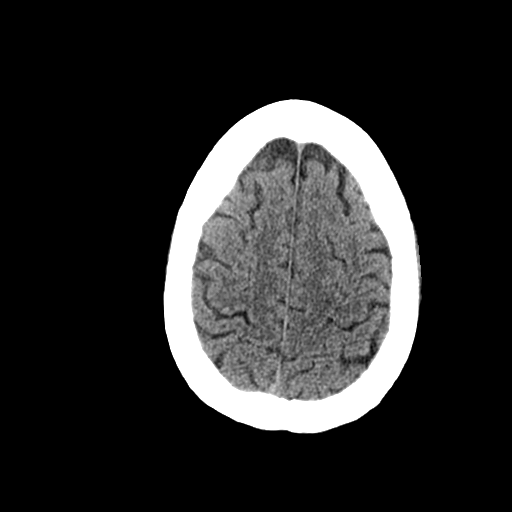
[im 27/30  brain]
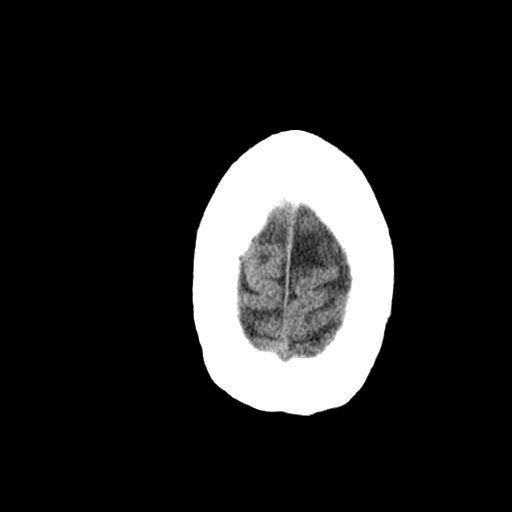
[im 27/30  bone]
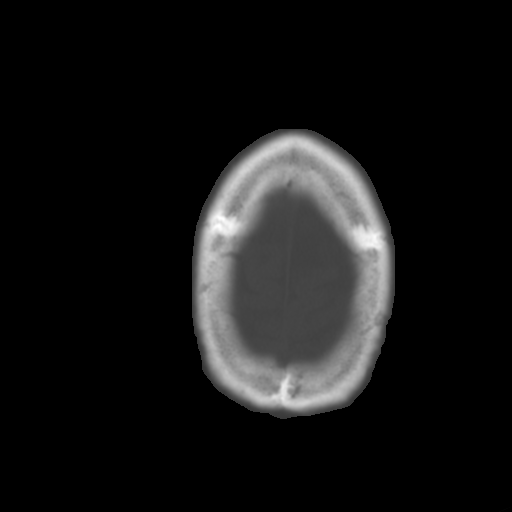

[Series 3: bone windows · axial · 0.48mm/px · z∈[+1007,+1121]mm · 8 of 50 slices shown]
[im 6/50  bone]
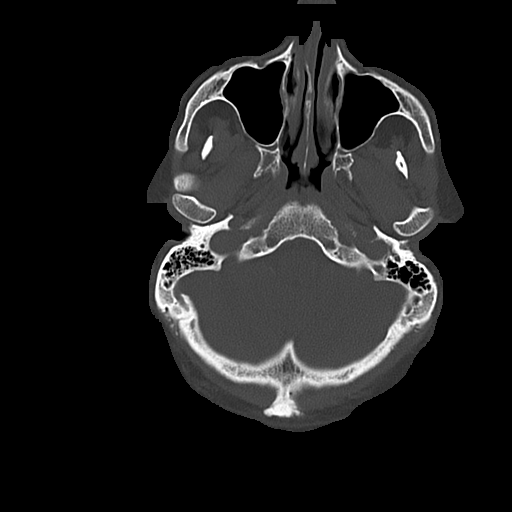
[im 11/50  bone]
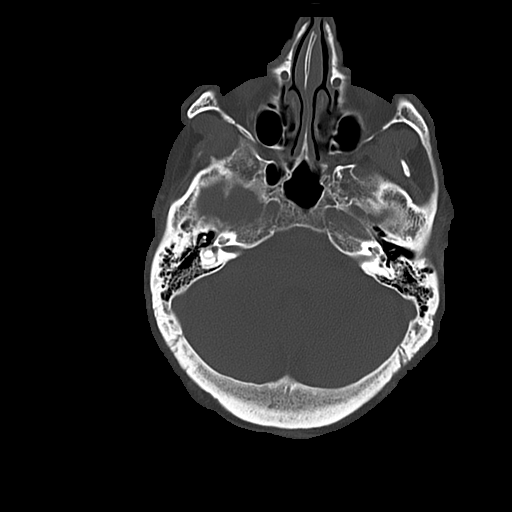
[im 17/50  bone]
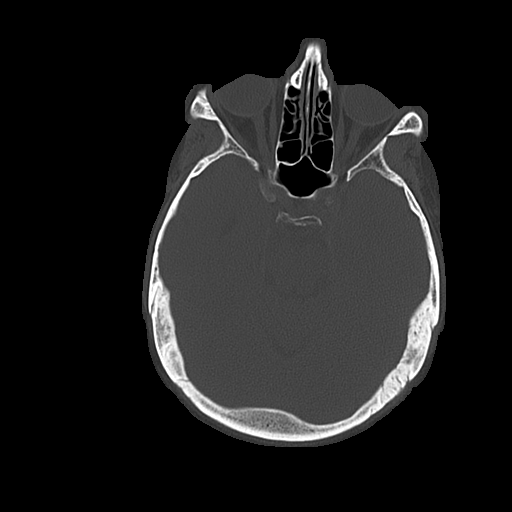
[im 22/50  bone]
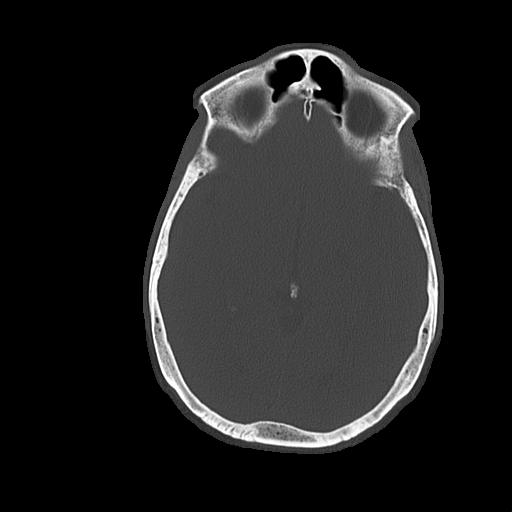
[im 28/50  bone]
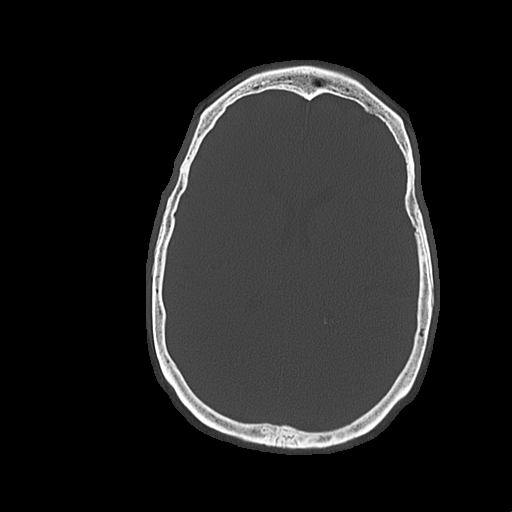
[im 33/50  bone]
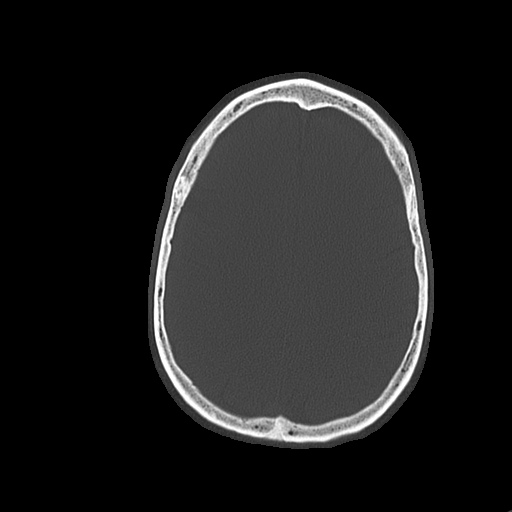
[im 39/50  bone]
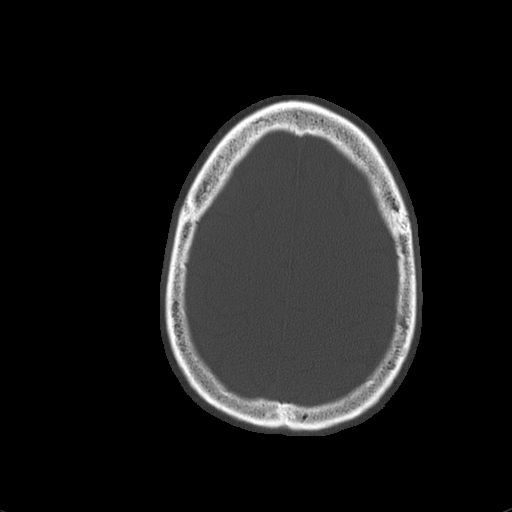
[im 44/50  bone]
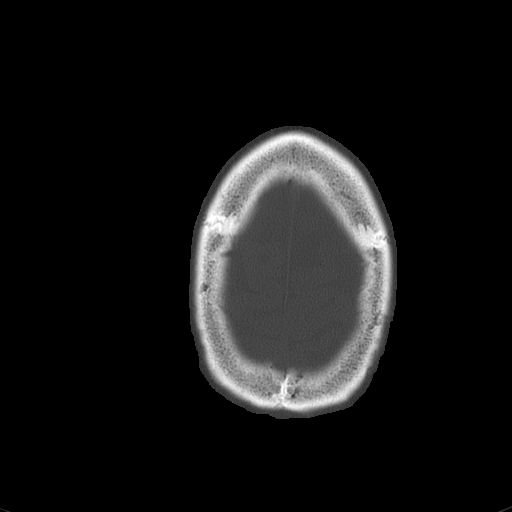

[17 of 30 positions shown; findings below may reference images not displayed]

FINDINGS: No mass effect, midline shift, or acute intracranial
hemorrhage.  Dilated perivascular space in the lower left basal
ganglia.  Brain parenchyma and ventricles system are within normal
limits.  Mastoid air cells are clear.  Visualized paranasal sinuses
are clear.
IMPRESSION: No acute intracranial pathology.

## 2014-04-18 IMAGING — CR DG CHEST 2V
2 series · 2 of 2 positions shown · non-contrast
Comparison: 11/15/2012.

CLINICAL DATA: 57-year-old male loss of consciousness.  History of
lymphoma undergoing chemotherapy.

CHEST - 2 VIEW

[w chest pa]
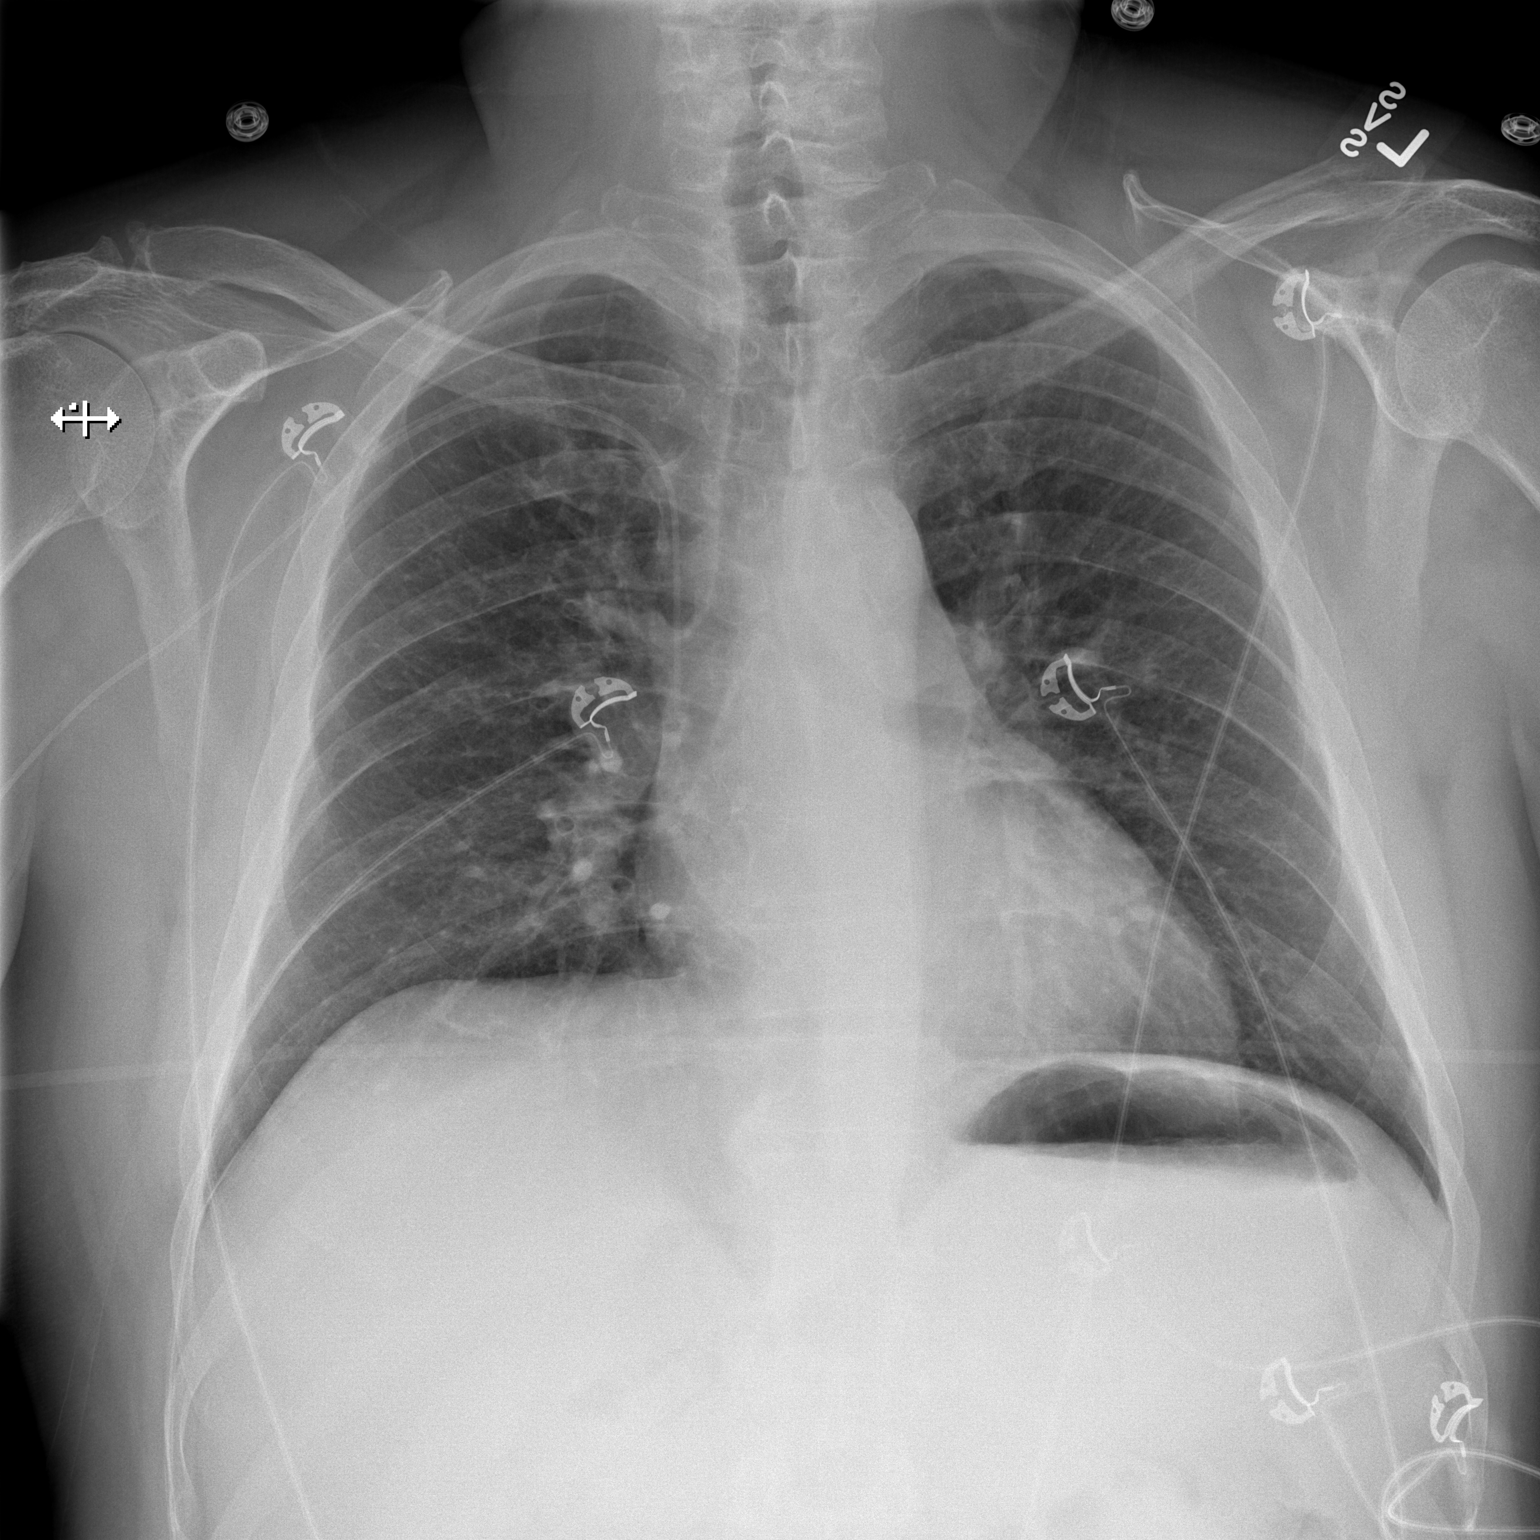

[w chest lat]
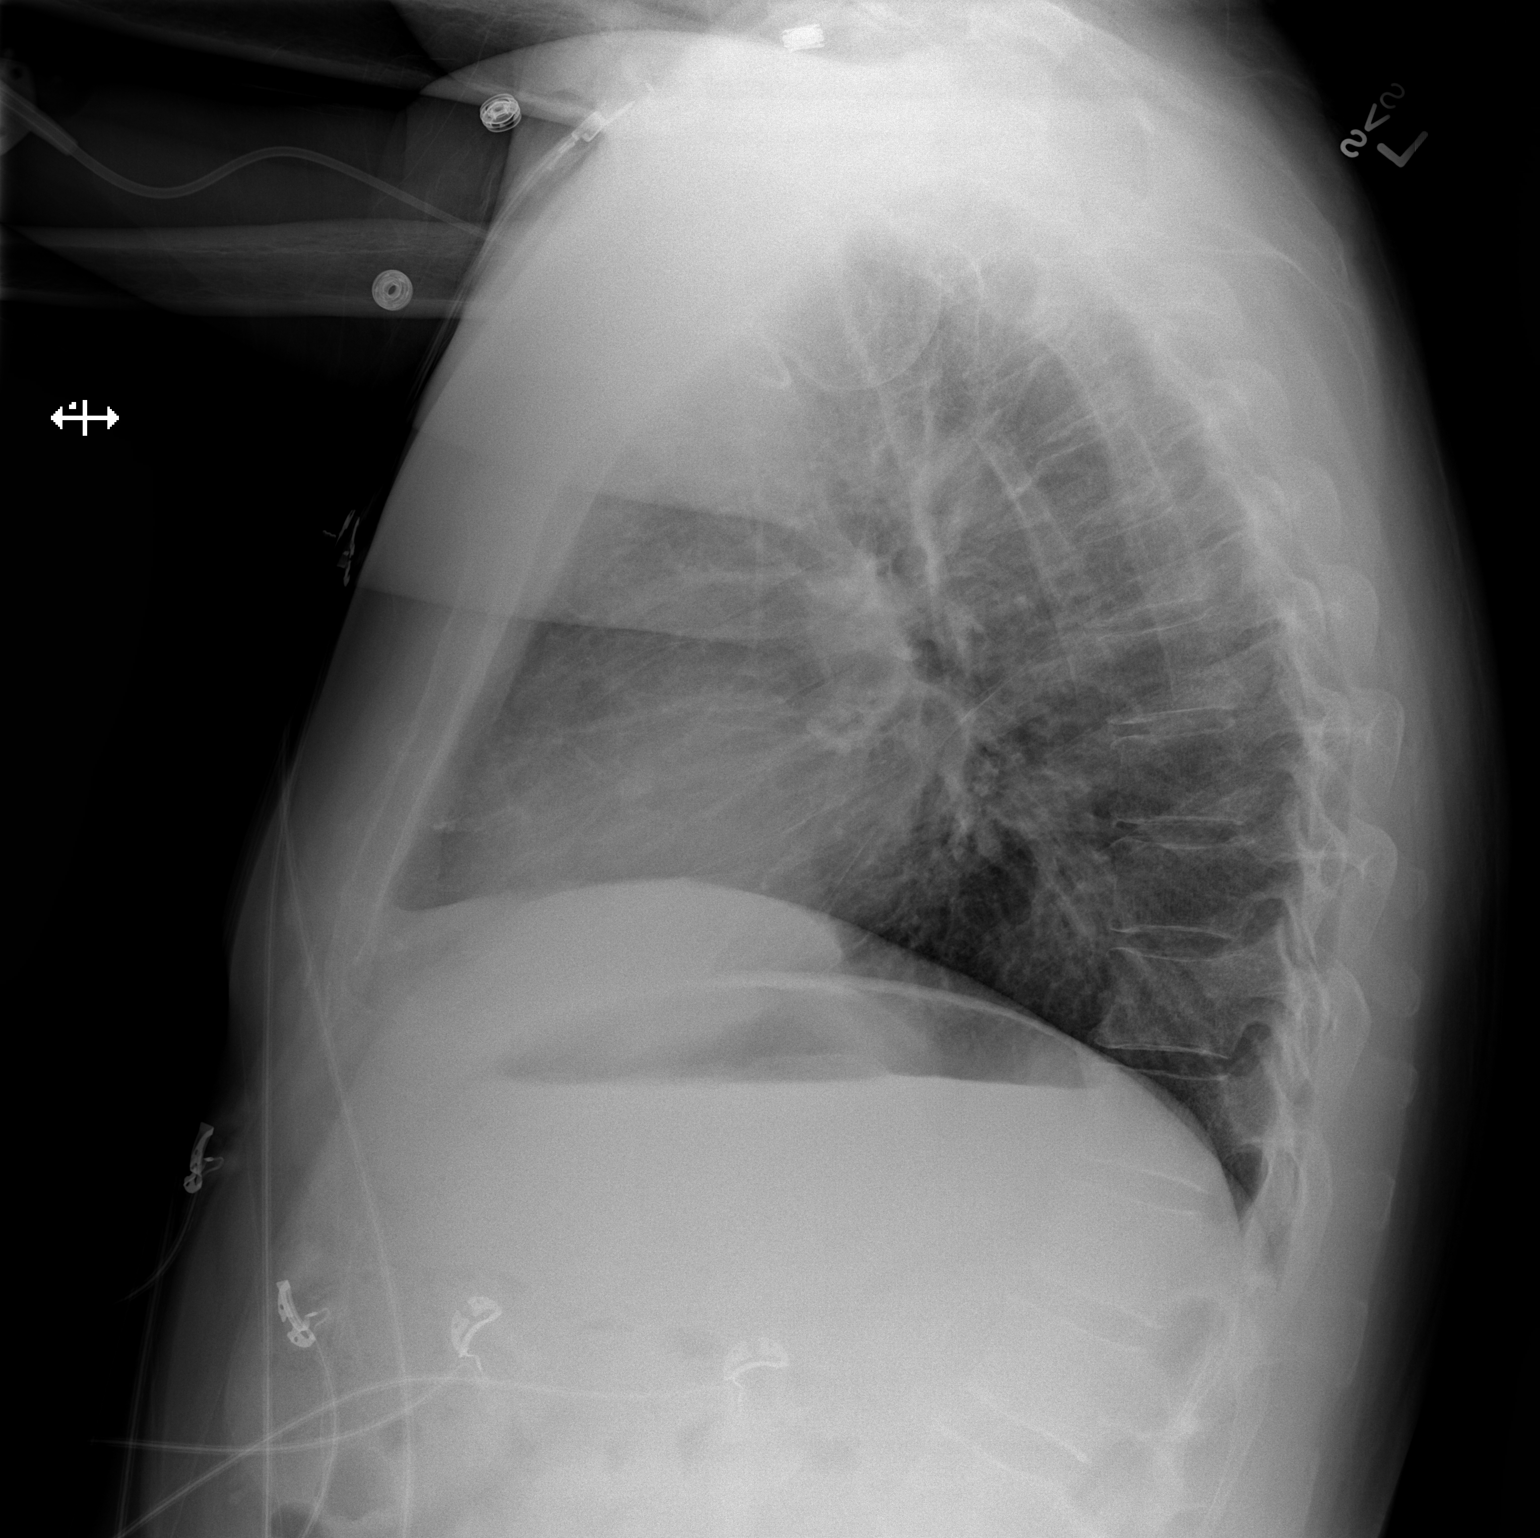

[2 of 2 positions shown; findings below may reference images not displayed]

FINDINGS: Right chest Port-A-Cath removed.  Right side PICC line
now in place.  Stable lung volumes.  Cardiac size and mediastinal
contours are within normal limits.  Visualized tracheal air column
is within normal limits.  No pneumothorax, pulmonary edema, pleural
effusion or confluent pulmonary opacity. No acute osseous
abnormality identified.
IMPRESSION: No acute cardiopulmonary abnormality.

## 2014-05-20 ENCOUNTER — Telehealth: Payer: Self-pay | Admitting: Oncology

## 2014-05-20 ENCOUNTER — Ambulatory Visit (HOSPITAL_BASED_OUTPATIENT_CLINIC_OR_DEPARTMENT_OTHER): Payer: BC Managed Care – PPO | Admitting: Oncology

## 2014-05-20 VITALS — BP 120/65 | HR 60 | Temp 98.4°F | Resp 18 | Ht 70.0 in | Wt 236.9 lb

## 2014-05-20 DIAGNOSIS — Z23 Encounter for immunization: Secondary | ICD-10-CM

## 2014-05-20 DIAGNOSIS — C8593 Non-Hodgkin lymphoma, unspecified, intra-abdominal lymph nodes: Secondary | ICD-10-CM

## 2014-05-20 MED ORDER — INFLUENZA VAC SPLIT QUAD 0.5 ML IM SUSY
0.5000 mL | PREFILLED_SYRINGE | Freq: Once | INTRAMUSCULAR | Status: AC
  Administered 2014-05-20: 0.5 mL via INTRAMUSCULAR
  Filled 2014-05-20: qty 0.5

## 2014-05-20 NOTE — Progress Notes (Signed)
  [place] OFFICE PROGRESS NOTE   Diagnosis: Non-Hodgkin's lymphoma  INTERVAL HISTORY:   Adam Alvarado returns as scheduled. He reports mild malaise. No fever, night sweats, abdominal pain, or palpable lymph nodes. Good appetite. He continues to work. The hair has regrown partially over the scalp and body. He reports significant hair growth since he was last here that has stabilized and has not returned to normal. Objective:  Vital signs in last 24 hours:  Blood pressure 120/65, pulse 60, temperature 98.4 F (36.9 C), temperature source Oral, resp. rate 18, height $RemoveBe'5\' 10"'hMVdtFdfO$  (1.778 m), weight 236 lb 14.4 oz (107.457 kg), SpO2 99.00%.    HEENT: Neck without mass Lymphatics: No cervical, supraclavicular, axillary, or inguinal nodes Resp: Lungs clear bilaterally Cardio: Regular rate and rhythm GI: No hepatomegaly, nontender, no mass Vascular: No leg edema  Skin: Partial alopecia over the scalp, extremities, and trunk    Lab Results:  Lab Results  Component Value Date   WBC 5.4 01/09/2014   HGB 15.1 01/09/2014   HCT 45.9 01/09/2014   MCV 89.5 01/09/2014   PLT 199 01/09/2014   NEUTROABS 3.7 01/09/2014    Medications: I have reviewed the patient's current medications.  Assessment/Plan: 1. Non-Hodgkin's lymphoma-Large abdominal mass surrounding small lymph nodes status post CT-guided biopsy on 07/20/2012 with the pathology suggestive of non-Hodgkin's lymphoma. Status post laparoscopic biopsy of the left abdominal mass on 08/21/2011 with final pathology confirming a large B-cell lymphoma, CD20 positive, with both follicular and diffuse patterns. Negative staging bone marrow biopsy. Status post cycle 1 CHOP/Rituxan on 09/06/2012. He completed cycle 5 on 12/10/2012 with Neulasta support. He completed cycle 6 with Neulasta support on 01/09/2013 -Restaging PET scan on 68/06/5725-[id number] improved metabolic activity associated with a prevascular node and jejunal mesenteric nodal mass  compared to the PET scan 09/05/2012, stable prevascular node and jejunal nodal mass compared to the CT from 11/20/2012 . Review of the 01/30/2013 PET scan in radiology was consistent with a complete hypermetabolic response  -Restaging PET scan 02/10/2014 improvement in size and metabolic activity associated with a residual prevascular lymph node and jejunal mesentery nodal mass  2. Nausea/vomiting following cycle 1 CHOP/Rituxan. Antiemetic regimen was adjusted with cycle 2 to include Aloxi, Decadron and Emend.  3. Status post Port-A-Cath placement 08/30/2012. Status post Port-A-Cath removal 11/26/2012. 4. Neutropenia following cycle 2 CHOP/Rituxan. Neulasta was added beginning with cycle 3. 5. Hiccups following treatment.  6. "Night sweats "-etiology unclear. Resolved after removal of the Port-A-Cath 7. Hospitalization 11/15/2012 through 11/27/2012 with fever. No source of infection identified. Port-A-Cath was removed on 11/26/2012. He completed a course of IV antibiotics 12/06/2012. 8. Admission with a syncope event on 12/17/2012, day 8 following cycle 5 CHOP-Rituxan  9. fever and neutropenia following cycle 5 CHOP-Rituxan, no source for infection was identified. A PICC was removed  10. Hypermetabolic activity in the distal esophagus on the PET scan 09/05/2012 and 01/30/2013-? Significant , upper endoscopy 02/26/2013 revealed esophagitis and no mass. He remains on antacid therapy. residual mild hypermetabolism associated with a hiatal hernia, improved on the PET scan 02/10/2014  11. Alopecia-Total body, partially improved   Disposition:  [name] remains in clinical remission from non-Hodgkin's lymphoma. He received an influenza vaccine today. He plans to obtain the pneumococcal vaccines via Dr. Willey Blade.  He will return for an office visit in 4 months.  Betsy Coder, MD  05/20/2014  10:52 AM

## 2014-05-20 NOTE — Telephone Encounter (Signed)
, °

## 2014-09-18 ENCOUNTER — Telehealth: Payer: Self-pay | Admitting: Oncology

## 2014-09-18 ENCOUNTER — Ambulatory Visit (HOSPITAL_BASED_OUTPATIENT_CLINIC_OR_DEPARTMENT_OTHER): Payer: BLUE CROSS/BLUE SHIELD | Admitting: Oncology

## 2014-09-18 VITALS — BP 123/71 | HR 68 | Temp 98.0°F | Resp 18 | Ht 70.0 in | Wt 234.7 lb

## 2014-09-18 DIAGNOSIS — C8333 Diffuse large B-cell lymphoma, intra-abdominal lymph nodes: Secondary | ICD-10-CM

## 2014-09-18 DIAGNOSIS — C8593 Non-Hodgkin lymphoma, unspecified, intra-abdominal lymph nodes: Secondary | ICD-10-CM

## 2014-09-18 NOTE — Progress Notes (Signed)
  [place] OFFICE PROGRESS NOTE   Diagnosis: Non-Hodgkin's lymphoma  INTERVAL HISTORY:   He returns as scheduled. He feels well. No fever or dyspnea. He currently has a "head cold ". He received a pneumococcal vaccine with his primary physician in December 2015. Adam Alvarado has noted partial regrowth of hair throughout his body. No abdominal pain or mass.  Objective:  Vital signs in last 24 hours:  Blood pressure 123/71, pulse 68, temperature 98 F (36.7 C), temperature source Oral, resp. rate 18, height _0  (1.778 m), weight 234 lb 11.2 oz (106.459 kg), SpO2 98 %.    HEENT: Neck without mass Lymphatics: No cervical, supraclavicular, axillary, or inguinal nodes Resp: Lungs clear bilaterally Cardio: Regular rate and rhythm GI: No hepatosplenomegaly, nontender, no mass,? Soft left inguinal hernia Vascular: No leg edema  Skin: Partial improvement in alopecia over the trunk and extremities   Medications: I have reviewed the patient's current medications.  Assessment/Plan: 1. Non-Hodgkin's lymphoma-Large abdominal mass surrounding small lymph nodes status post CT-guided biopsy on 07/20/2012 with the pathology suggestive of non-Hodgkin's lymphoma. Status post laparoscopic biopsy of the left abdominal mass on 08/21/2011 with final pathology confirming a large B-cell lymphoma, CD20 positive, with both follicular and diffuse patterns. Negative staging bone marrow biopsy. Status post cycle 1 CHOP/Rituxan on 09/06/2012. He completed cycle 5 on 12/10/2012 with Neulasta support. He completed cycle 6 with Neulasta support on 01/09/2013 -Restaging PET scan on 23/55/[id number] improved metabolic activity associated with a prevascular node and jejunal mesenteric nodal mass compared to the PET scan 09/05/2012, stable prevascular node and jejunal nodal mass compared to the CT from 11/20/2012 . Review of the 01/30/2013 PET scan in radiology was consistent with a complete hypermetabolic  response  -Restaging PET scan 02/10/2014 improvement in size and metabolic activity associated with a residual prevascular lymph node and jejunal mesentery nodal mass  2. Nausea/vomiting following cycle 1 CHOP/Rituxan. Antiemetic regimen was adjusted with cycle 2 to include Aloxi, Decadron and Emend.  3. Status post Port-A-Cath placement 08/30/2012. Status post Port-A-Cath removal 11/26/2012. 4. Neutropenia following cycle 2 CHOP/Rituxan. Neulasta was added beginning with cycle 3. 5. Hiccups following treatment.  6. "Night sweats "-etiology unclear. Resolved after removal of the Port-A-Cath 7. Hospitalization 11/15/2012 through 11/27/2012 with fever. No source of infection identified. Port-A-Cath was removed on 11/26/2012. He completed a course of IV antibiotics 12/06/2012. 8. Admission with a syncope event on 12/17/2012, day 8 following cycle 5 CHOP-Rituxan 9. fever and neutropenia following cycle 5 CHOP-Rituxan, no source for infection was identified. A PICC was removed  10. Hypermetabolic activity in the distal esophagus on the PET scan 09/05/2012 and 01/30/2013-? Significant , upper endoscopy 02/26/2013 revealed esophagitis and no mass. He remains on antacid therapy. residual mild hypermetabolism associated with a hiatal hernia, improved on the PET scan 02/10/2014  11. Alopecia-Total body, partially improved    Disposition:  Adam Alvarado remains in clinical remission from non-Hodgkin's lymphoma. He will return for an office visit in 6 months. He will contact us in the interim for new symptoms.  Betsy Coder, MD  09/18/2014  11:25 AM

## 2014-09-18 NOTE — Telephone Encounter (Signed)
Pt confirmed MD visit per 02/04 POF, gave pt AVS.... KJ

## 2015-04-09 ENCOUNTER — Ambulatory Visit (HOSPITAL_BASED_OUTPATIENT_CLINIC_OR_DEPARTMENT_OTHER): Payer: BLUE CROSS/BLUE SHIELD | Admitting: Oncology

## 2015-04-09 ENCOUNTER — Telehealth: Payer: Self-pay | Admitting: Oncology

## 2015-04-09 VITALS — BP 126/68 | HR 55 | Temp 98.0°F | Resp 18 | Ht 70.0 in | Wt 240.2 lb

## 2015-04-09 DIAGNOSIS — Z8572 Personal history of non-Hodgkin lymphomas: Secondary | ICD-10-CM | POA: Diagnosis not present

## 2015-04-09 DIAGNOSIS — C8593 Non-Hodgkin lymphoma, unspecified, intra-abdominal lymph nodes: Secondary | ICD-10-CM

## 2015-04-09 NOTE — Progress Notes (Signed)
  [place] OFFICE PROGRESS NOTE   Diagnosis: Non-Hodgkin's lymphoma  INTERVAL HISTORY:   [name] returns as scheduled. He feels well. No fever, night sweats, or anorexia. No abdominal pain. He has experienced partial hair regrowth.  Objective:  Vital signs in last 24 hours:  Blood pressure 126/68, pulse 55, temperature 98 F (36.7 C), temperature source Oral, resp. rate 18, height _0  (1.778 m), weight 240 lb 3.2 oz (108.954 kg), SpO2 98 %.    HEENT: Neck without mass Lymphatics: No cervical, supra-clavicular, axillary, or inguinal nodes Resp: A few coarse rhonchi at the left posterior base, no respiratory distress Cardio: Regular rate and rhythm GI: No hepatosplenomegaly, nontender, no mass Vascular: No leg edema  Skin: Partial hair regrowth at the axillary, chest, and extremities     Lab Results:  Lab Results  Component Value Date   WBC 5.4 01/09/2014   HGB 15.1 01/09/2014   HCT 45.9 01/09/2014   MCV 89.5 01/09/2014   PLT 199 01/09/2014   NEUTROABS 3.7 01/09/2014     Medications: I have reviewed the patient's current medications.  Assessment/Plan: 1. Non-Hodgkin's lymphoma-Large abdominal mass surrounding small lymph nodes status post CT-guided biopsy on 07/20/2012 with the pathology suggestive of non-Hodgkin's lymphoma. Status post laparoscopic biopsy of the left abdominal mass on 08/21/2011 with final pathology confirming a large B-cell lymphoma, CD20 positive, with both follicular and diffuse patterns. Negative staging bone marrow biopsy. Status post cycle 1 CHOP/Rituxan on 09/06/2012. He completed cycle 5 on 12/10/2012 with Neulasta support. He completed cycle 6 with Neulasta support on 01/09/2013 -Restaging PET scan on 63/08/[id number] improved metabolic activity associated with a prevascular node and jejunal mesenteric nodal mass compared to the PET scan 09/05/2012, stable prevascular node and jejunal nodal mass compared to the CT from  11/20/2012 . Review of the 01/30/2013 PET scan in radiology was consistent with a complete hypermetabolic response  -Restaging PET scan 02/10/2014 improvement in size and metabolic activity associated with a residual prevascular lymph node and jejunal mesentery nodal mass  2. Nausea/vomiting following cycle 1 CHOP/Rituxan. Antiemetic regimen was adjusted with cycle 2 to include Aloxi, Decadron and Emend.  3. Status post Port-A-Cath placement 08/30/2012. Status post Port-A-Cath removal 11/26/2012. 4. Neutropenia following cycle 2 CHOP/Rituxan. Neulasta was added beginning with cycle 3. 5. Hiccups following treatment.  6. "Night sweats "-etiology unclear. Resolved after removal of the Port-A-Cath 7. Hospitalization 11/15/2012 through 11/27/2012 with fever. No source of infection identified. Port-A-Cath was removed on 11/26/2012. He completed a course of IV antibiotics 12/06/2012. 8. Admission with a syncope event on 12/17/2012, day 8 following cycle 5 CHOP-Rituxan 9. fever and neutropenia following cycle 5 CHOP-Rituxan, no source for infection was identified. A PICC was removed  10. Hypermetabolic activity in the distal esophagus on the PET scan 09/05/2012 and 01/30/2013-? Significant , upper endoscopy 02/26/2013 revealed esophagitis and no mass. He remains on antacid therapy. residual mild hypermetabolism associated with a hiatal hernia, improved on the PET scan 02/10/2014  11. Alopecia-Total body, partially improved    Disposition:  Mr. Shaheen remains in clinical remission from non-Hodgkin's lymphoma. He will return for an office visit in 6 months. He will schedule an influenza vaccine with Dr. Willey Blade within the next one to 2 months. He will also discuss the 13 valent pneumococcal vaccine with Dr. Willey Blade. He received a 23 valent pneumococcal vaccine in December 2015.  Betsy Coder, MD  04/09/2015  11:42 AM

## 2015-04-09 NOTE — Telephone Encounter (Signed)
per pof to sch pt appt-gave pt copy of avs °

## 2015-06-04 ENCOUNTER — Encounter: Payer: Self-pay | Admitting: Oncology

## 2015-07-13 ENCOUNTER — Encounter: Payer: Self-pay | Admitting: Gastroenterology

## 2015-08-07 ENCOUNTER — Encounter: Payer: Self-pay | Admitting: Gastroenterology

## 2015-08-21 ENCOUNTER — Encounter: Payer: Self-pay | Admitting: *Deleted

## 2015-10-12 ENCOUNTER — Ambulatory Visit (HOSPITAL_BASED_OUTPATIENT_CLINIC_OR_DEPARTMENT_OTHER): Payer: BLUE CROSS/BLUE SHIELD | Admitting: Oncology

## 2015-10-12 ENCOUNTER — Telehealth: Payer: Self-pay | Admitting: Oncology

## 2015-10-12 VITALS — BP 129/62 | HR 60 | Temp 98.0°F | Resp 18 | Ht 70.0 in | Wt 232.9 lb

## 2015-10-12 DIAGNOSIS — Z8572 Personal history of non-Hodgkin lymphomas: Secondary | ICD-10-CM | POA: Diagnosis not present

## 2015-10-12 DIAGNOSIS — C8333 Diffuse large B-cell lymphoma, intra-abdominal lymph nodes: Secondary | ICD-10-CM

## 2015-10-12 NOTE — Telephone Encounter (Signed)
appt made and avs printed °

## 2015-10-12 NOTE — Progress Notes (Signed)
  Adam Alvarado   Diagnosis: Non-Hodgkin's lymphoma  INTERVAL HISTORY:   Adam Alvarado returns as scheduled.  he feels well aside from malaise. He is working. No fever, night sweats, anorexia, or palpable lymph nodes. He reports 2 episodes of "gout "over the past 6 months effecting the left foot and ankle.   Objective:  Vital signs in last 24 hours:  Blood pressure 129/62, pulse 60, temperature 98 F (36.7 C), temperature source Oral, resp. rate 18, height '5\' 10"'$  (1.778 m), weight 232 lb 14.4 oz (105.643 kg), SpO2 98 %.    HEENT: neck without mass Resp: Lungs clear bilaterally Cardio: Regular rate and rhythm GI: No mass, no hepatosplenomegaly, nontender Vascular: No leg edema Lymph nodes: No cervical, supraclavicular, axillary, or inguinal nodes  Skin: Significant hair regrowth at the trunk and axillary areas     Medications: I have reviewed the patient's current medications.  Assessment/Plan: 1. Non-Hodgkin's lymphoma-Large abdominal mass surrounding small lymph nodes status post CT-guided biopsy on 07/20/2012 with the pathology suggestive of non-Hodgkin's lymphoma. Status post laparoscopic biopsy of the left abdominal mass on 08/21/2011 with final pathology confirming a large B-cell lymphoma, CD20 positive, with both follicular and diffuse patterns. Negative staging bone marrow biopsy. Status post cycle 1 CHOP/Rituxan on 09/06/2012. He completed cycle 5 on 12/10/2012 with Neulasta support. He completed cycle 6 with Neulasta support on 01/09/2013 -Restaging PET scan on 03/00/[id number] improved metabolic activity associated with a prevascular node and jejunal mesenteric nodal mass compared to the PET scan 09/05/2012, stable prevascular node and jejunal nodal mass compared to the CT from 11/20/2012 . Review of the 01/30/2013 PET scan in radiology was consistent with a complete hypermetabolic response  -Restaging PET scan 02/10/2014 improvement in size  and metabolic activity associated with a residual prevascular lymph node and jejunal mesentery nodal mass  2. Nausea/vomiting following cycle 1 CHOP/Rituxan. Antiemetic regimen was adjusted with cycle 2 to include Aloxi, Decadron and Emend.  3. Status post Port-A-Cath placement 08/30/2012. Status post Port-A-Cath removal 11/26/2012. 4. Neutropenia following cycle 2 CHOP/Rituxan. Neulasta was added beginning with cycle 3. 5. Hiccups following treatment.  6. "Night sweats "-etiology unclear. Resolved after removal of the Port-A-Cath 7. Hospitalization 11/15/2012 through 11/27/2012 with fever. No source of infection identified. Port-A-Cath was removed on 11/26/2012. He completed a course of IV antibiotics 12/06/2012. 8. Admission with a syncope event on 12/17/2012, day 8 following cycle 5 CHOP-Rituxan 9. fever and neutropenia following cycle 5 CHOP-Rituxan, no source for infection was identified. A PICC was removed  10. Hypermetabolic activity in the distal esophagus on the PET scan 09/05/2012 and 01/30/2013-? Significant , upper endoscopy 02/26/2013 revealed esophagitis and no mass. He remains on antacid therapy. residual mild hypermetabolism associated with a hiatal hernia, improved on the PET scan 02/10/2014  11. Alopecia-Total body, partially improved 12. Gout   Disposition:  Mr. Paino remains in clinical remission from non-Hodgkin's lymphoma. I doubt the malaise is related to the history of chemotherapy or lymphoma. He will return for an office visit in 8 months. He will contact us in the interim for new symptoms.  Adam Coder, MD  10/12/2015  12:13 PM

## 2016-02-03 ENCOUNTER — Encounter: Payer: Self-pay | Admitting: Gastroenterology

## 2016-04-15 ENCOUNTER — Encounter: Payer: Self-pay | Admitting: Gastroenterology

## 2016-04-15 ENCOUNTER — Ambulatory Visit (AMBULATORY_SURGERY_CENTER): Payer: Self-pay

## 2016-04-15 VITALS — Ht 71.0 in | Wt 239.6 lb

## 2016-04-15 DIAGNOSIS — Z860100 Personal history of colon polyps, unspecified: Secondary | ICD-10-CM

## 2016-04-15 MED ORDER — SUPREP BOWEL PREP KIT 17.5-3.13-1.6 GM/177ML PO SOLN: 1.0000 | Freq: Once | ORAL | 0 refills | Status: AC

## 2016-04-15 NOTE — Progress Notes (Signed)
No diet meds No home oxgyen No pat problems with anesthesia except "slow to wake up" No allergies to eggs or soy  Declined emmi

## 2016-04-29 ENCOUNTER — Encounter: Payer: Self-pay | Admitting: Gastroenterology

## 2016-04-29 ENCOUNTER — Ambulatory Visit (AMBULATORY_SURGERY_CENTER): Payer: BLUE CROSS/BLUE SHIELD | Admitting: Gastroenterology

## 2016-04-29 VITALS — BP 92/62 | HR 49 | Temp 96.2°F | Resp 16 | Ht 71.0 in | Wt 239.0 lb

## 2016-04-29 DIAGNOSIS — Z86010 Personal history of colonic polyps: Secondary | ICD-10-CM | POA: Diagnosis not present

## 2016-04-29 DIAGNOSIS — D123 Benign neoplasm of transverse colon: Secondary | ICD-10-CM

## 2016-04-29 MED ORDER — SODIUM CHLORIDE 0.9 % IV SOLN: 500.0000 mL | INTRAVENOUS | Status: DC

## 2016-04-29 NOTE — Patient Instructions (Signed)
Discharge instructions given. Handouts on polyps and hemorrhoids. Resume previous medications. YOU HAD AN ENDOSCOPIC PROCEDURE TODAY AT THE [facility]:   Refer to the procedure report that was given to you for any specific questions about what was found during the examination.  If the procedure report does not answer your questions, please call your gastroenterologist to clarify.  If you requested that your care partner not be given the details of your procedure findings, then the procedure report has been included in a sealed envelope for you to review at your convenience later.  YOU SHOULD EXPECT: Some feelings of bloating in the abdomen. Passage of more gas than usual.  Walking can help get rid of the air that was put into your GI tract during the procedure and reduce the bloating. If you had a lower endoscopy (such as a colonoscopy or flexible sigmoidoscopy) you may notice spotting of blood in your stool or on the toilet paper. If you underwent a bowel prep for your procedure, you may not have a normal bowel movement for a few days.  Please Note:  You might notice some irritation and congestion in your nose or some drainage.  This is from the oxygen used during your procedure.  There is no need for concern and it should clear up in a day or so.  SYMPTOMS TO REPORT IMMEDIATELY:   Following lower endoscopy (colonoscopy or flexible sigmoidoscopy):  Excessive amounts of blood in the stool  Significant tenderness or worsening of abdominal pains  Swelling of the abdomen that is new, acute  Fever of 100F or higher   For urgent or emergent issues, a gastroenterologist can be reached at any hour by calling [phone].   DIET:  We do recommend a small meal at first, but then you may proceed to your regular diet.  Drink plenty of fluids but you should avoid alcoholic beverages for 24 hours.  ACTIVITY:  You should plan to take it easy for the rest of today and you should NOT DRIVE  or use heavy machinery until tomorrow (because of the sedation medicines used during the test).    FOLLOW UP: Our staff will call the number listed on your records the next business day following your procedure to check on you and address any questions or concerns that you may have regarding the information given to you following your procedure. If we do not reach you, we will leave a message.  However, if you are feeling well and you are not experiencing any problems, there is no need to return our call.  We will assume that you have returned to your regular daily activities without incident.  If any biopsies were taken you will be contacted by phone or by letter within the next 1-3 weeks.  Please call us at [phone] if you have not heard about the biopsies in 3 weeks.    SIGNATURES/CONFIDENTIALITY: You and/or your care partner have signed paperwork which will be entered into your electronic medical record.  These signatures attest to the fact that that the information above on your After Visit Summary has been reviewed and is understood.  Full responsibility of the confidentiality of this discharge information lies with you and/or your care-partner. 

## 2016-04-29 NOTE — Progress Notes (Signed)
Report to PACU, RN, vss, BBS= Clear.  

## 2016-04-29 NOTE — Op Note (Signed)
[place] Patient Name: Adam Alvarado Procedure Date: 04/29/2016 7:54 AM MRN: [id number] Endoscopist: Ladene Artist , MD Age: 60 Referring MD:  Date of Birth: Apr 02, 1956 Gender: Male Account #: [id number] Procedure:                Colonoscopy Indications:              Surveillance: Personal history of adenomatous                            polyps on last colonoscopy > 5 years ago Medicines:                Monitored Anesthesia Care Procedure:                Pre-Anesthesia Assessment:                           - Prior to the procedure, a History and Physical                            was performed, and patient medications and                            allergies were reviewed. The patient's tolerance of                            previous anesthesia was also reviewed. The risks                            and benefits of the procedure and the sedation                            options and risks were discussed with the patient.                            All questions were answered, and informed consent                            was obtained. Prior Anticoagulants: The patient has                            taken no previous anticoagulant or antiplatelet                            agents. ASA Grade Assessment: II - A patient with                            mild systemic disease. After reviewing the risks                            and benefits, the patient was deemed in                            satisfactory condition to undergo the procedure.  After obtaining informed consent, the colonoscope                            was passed under direct vision. Throughout the                            procedure, the patient's blood pressure, pulse, and                            oxygen saturations were monitored continuously. The                            Model PCF-[id number]) scope was introduced                            through the anus and  advanced to the the cecum,                            identified by appendiceal orifice and ileocecal                            valve. The ileocecal valve, appendiceal orifice,                            and rectum were photographed. The quality of the                            bowel preparation was good. The colonoscopy was                            performed without difficulty. The patient tolerated                            the procedure well. Scope In: 8:09:36 AM Scope Out: 8:25:05 AM Scope Withdrawal Time: 0 hours 11 minutes 32 seconds  Total Procedure Duration: 0 hours 15 minutes 29 seconds  Findings:                 Three sessile polyps were found in the transverse                            colon. The polyps were 5 to 8 mm in size. These                            polyps were removed with a cold snare. Resection                            and retrieval were complete.                           Internal hemorrhoids were found during                            retroflexion. The hemorrhoids were small and Grade  I (internal hemorrhoids that do not prolapse).                           The exam was otherwise without abnormality on                            direct and retroflexion views. Complications:            No immediate complications. Estimated blood loss:                            None. Estimated Blood Loss:     Estimated blood loss: none. Impression:               - Three 5 to 8 mm polyps in the transverse colon,                            removed with a cold snare. Resected and retrieved.                           - Internal hemorrhoids.                           - The examination was otherwise normal on direct                            and retroflexion views. Recommendation:           - Repeat colonoscopy in 5 years for surveillance.                           - Patient has a contact number available for                            emergencies.  The signs and symptoms of potential                            delayed complications were discussed with the                            patient. Return to normal activities tomorrow.                            Written discharge instructions were provided to the                            patient.                           - Resume previous diet.                           - Continue present medications.                           - Await pathology results. Ladene Artist, MD 04/29/2016 8:29:44 AM This report has been signed  electronically.

## 2016-04-29 NOTE — Progress Notes (Signed)
Called to room for pathology. 

## 2016-05-02 ENCOUNTER — Telehealth: Payer: Self-pay | Admitting: *Deleted

## 2016-05-02 NOTE — Telephone Encounter (Signed)
  Follow up Call-  Call back number 04/29/2016  Post procedure Call Back phone  # [phone]  Permission to leave phone message Yes  Some recent data might be hidden     Patient questions:  Do you have a fever, pain , or abdominal swelling? No. Pain Score  0 *  Have you tolerated food without any problems? Yes.    Have you been able to return to your normal activities? Yes.    Do you have any questions about your discharge instructions: Diet   No. Medications  No. Follow up visit  No.  Do you have questions or concerns about your Care? No.  Actions: * If pain score is 4 or above: No action needed, pain <4.

## 2016-05-10 ENCOUNTER — Encounter: Payer: Self-pay | Admitting: Gastroenterology

## 2016-05-10 ENCOUNTER — Telehealth: Payer: Self-pay

## 2016-06-09 ENCOUNTER — Telehealth: Payer: Self-pay | Admitting: Oncology

## 2016-06-09 ENCOUNTER — Ambulatory Visit: Payer: BLUE CROSS/BLUE SHIELD | Admitting: Oncology

## 2016-06-09 ENCOUNTER — Ambulatory Visit (HOSPITAL_BASED_OUTPATIENT_CLINIC_OR_DEPARTMENT_OTHER): Payer: BLUE CROSS/BLUE SHIELD | Admitting: Nurse Practitioner

## 2016-06-09 VITALS — BP 137/66 | HR 56 | Temp 98.2°F | Resp 18 | Ht 71.0 in | Wt 241.4 lb

## 2016-06-09 DIAGNOSIS — Z8572 Personal history of non-Hodgkin lymphomas: Secondary | ICD-10-CM | POA: Diagnosis not present

## 2016-06-09 DIAGNOSIS — Z23 Encounter for immunization: Secondary | ICD-10-CM | POA: Diagnosis not present

## 2016-06-09 DIAGNOSIS — L630 Alopecia (capitis) totalis: Secondary | ICD-10-CM

## 2016-06-09 DIAGNOSIS — M109 Gout, unspecified: Secondary | ICD-10-CM

## 2016-06-09 MED ORDER — INFLUENZA VAC SPLIT QUAD 0.5 ML IM SUSY
0.5000 mL | PREFILLED_SYRINGE | Freq: Once | INTRAMUSCULAR | Status: AC
  Administered 2016-06-09: 0.5 mL via INTRAMUSCULAR
  Filled 2016-06-09: qty 0.5

## 2016-06-09 NOTE — Telephone Encounter (Signed)
AVS report and appointment schedule was given to patient,per 06/09/16 los.

## 2016-06-09 NOTE — Progress Notes (Signed)
  [place] OFFICE PROGRESS NOTE   Diagnosis:  Non-Hodgkin's lymphoma  INTERVAL HISTORY:   [name] returns as scheduled. He feels well. No fevers or sweats. No enlarged lymph nodes. He reports a good appetite. No weight loss. He denies pain. No GI or GU complaints. No interim illnesses or infections. He reports 2 recent tick bites.  Objective:  Vital signs in last 24 hours:  Blood pressure 137/66, pulse (!) 56, temperature 98.2 F (36.8 C), temperature source Oral, resp. rate 18, height '5\' 11"'$  (1.803 m), weight 241 lb 6.4 oz (109.5 kg), SpO2 100 %.    HEENT: No thrush or ulcers. Lymphatics: No palpable cervical, supraclavicular, axillary or inguinal lymph nodes. Resp: Lungs clear bilaterally. Cardio: Regular rate and rhythm. GI: Abdomen soft and nontender. No organomegaly. No mass. Vascular: No leg edema.   Lab Results:  Lab Results  Component Value Date   WBC 5.4 01/09/2014   HGB 15.1 01/09/2014   HCT 45.9 01/09/2014   MCV 89.5 01/09/2014   PLT 199 01/09/2014   NEUTROABS 3.7 01/09/2014    Imaging:  No results found.  Medications: I have reviewed the patient's current medications.  Assessment/Plan: 1. Non-Hodgkin's lymphoma-Large abdominal mass surrounding small lymph nodes status post CT-guided biopsy on 07/20/2012 with the pathology suggestive of non-Hodgkin's lymphoma. Status post laparoscopic biopsy of the left abdominal mass on 08/21/2011 with final pathology confirming a large B-cell lymphoma, CD20 positive, with both follicular and diffuse patterns. Negative staging bone marrow biopsy. Status post cycle 1 CHOP/Rituxan on 09/06/2012. He completed cycle 5 on 12/10/2012 with Neulasta support. He completed cycle 6 with Neulasta support on 01/09/2013 -Restaging PET scan on 82/80/0349-[id number] improved metabolic activity associated with a prevascular node and jejunal mesenteric nodal mass compared to the PET scan 09/05/2012, stable prevascular node and  jejunal nodal mass compared to the CT from 11/20/2012 . Review of the 01/30/2013 PET scan in radiology was consistent with a complete hypermetabolic response  -Restaging PET scan 02/10/2014 improvement in size and metabolic activity associated with a residual prevascular lymph node and jejunal mesentery nodal mass  2. Nausea/vomiting following cycle 1 CHOP/Rituxan. Antiemetic regimen was adjusted with cycle 2 to include Aloxi, Decadron and Emend.  3. Status post Port-A-Cath placement 08/30/2012. Status post Port-A-Cath removal 11/26/2012. 4. Neutropenia following cycle 2 CHOP/Rituxan. Neulasta was added beginning with cycle 3. 5. Hiccups following treatment.  6. "Night sweats "-etiology unclear. Resolved after removal of the Port-A-Cath 7. Hospitalization 11/15/2012 through 11/27/2012 with fever. No source of infection identified. Port-A-Cath was removed on 11/26/2012. He completed a course of IV antibiotics 12/06/2012. 8. Admission with a syncope event on 12/17/2012, day 8 following cycle 5 CHOP-Rituxan 9. fever and neutropenia following cycle 5 CHOP-Rituxan, no source for infection was identified. A PICC was removed  10. Hypermetabolic activity in the distal esophagus on the PET scan 09/05/2012 and 01/30/2013-? Significant , upper endoscopy 02/26/2013 revealed esophagitis and no mass. He remains on antacid therapy. residual mild hypermetabolism associated with a hiatal hernia, improved on the PET scan 02/10/2014  11. Alopecia-Total body, partially improved 12. Gout   Disposition: Mr. Head remains in clinical remission from non-Hodgkin's lymphoma. He will return for a follow-up visit in 8 months. He will contact the office in the interim with any problems.  Influenza vaccine administered at today's visit.    Ned Card ANP/GNP-BC   06/09/2016  12:21 PM

## 2016-06-20 DIAGNOSIS — L821 Other seborrheic keratosis: Secondary | ICD-10-CM | POA: Diagnosis not present

## 2016-06-20 DIAGNOSIS — S20461A Insect bite (nonvenomous) of right back wall of thorax, initial encounter: Secondary | ICD-10-CM | POA: Diagnosis not present

## 2016-08-06 ENCOUNTER — Other Ambulatory Visit: Payer: Self-pay | Admitting: Nurse Practitioner

## 2016-09-05 DIAGNOSIS — C8590 Non-Hodgkin lymphoma, unspecified, unspecified site: Secondary | ICD-10-CM | POA: Diagnosis not present

## 2016-09-05 DIAGNOSIS — Z125 Encounter for screening for malignant neoplasm of prostate: Secondary | ICD-10-CM | POA: Diagnosis not present

## 2016-09-05 DIAGNOSIS — N189 Chronic kidney disease, unspecified: Secondary | ICD-10-CM | POA: Diagnosis not present

## 2016-09-05 DIAGNOSIS — E119 Type 2 diabetes mellitus without complications: Secondary | ICD-10-CM | POA: Diagnosis not present

## 2016-09-05 DIAGNOSIS — Z79899 Other long term (current) drug therapy: Secondary | ICD-10-CM | POA: Diagnosis not present

## 2016-09-12 DIAGNOSIS — Z6834 Body mass index (BMI) 34.0-34.9, adult: Secondary | ICD-10-CM | POA: Diagnosis not present

## 2016-09-12 DIAGNOSIS — C8590 Non-Hodgkin lymphoma, unspecified, unspecified site: Secondary | ICD-10-CM | POA: Diagnosis not present

## 2016-09-12 DIAGNOSIS — Z0001 Encounter for general adult medical examination with abnormal findings: Secondary | ICD-10-CM | POA: Diagnosis not present

## 2016-09-12 DIAGNOSIS — M1000 Idiopathic gout, unspecified site: Secondary | ICD-10-CM | POA: Diagnosis not present

## 2016-09-12 DIAGNOSIS — Z23 Encounter for immunization: Secondary | ICD-10-CM | POA: Diagnosis not present

## 2016-09-26 DIAGNOSIS — M79602 Pain in left arm: Secondary | ICD-10-CM | POA: Diagnosis not present

## 2017-02-07 ENCOUNTER — Ambulatory Visit: Payer: BLUE CROSS/BLUE SHIELD | Admitting: Oncology

## 2017-02-09 ENCOUNTER — Ambulatory Visit (HOSPITAL_BASED_OUTPATIENT_CLINIC_OR_DEPARTMENT_OTHER): Payer: BLUE CROSS/BLUE SHIELD | Admitting: Oncology

## 2017-02-09 VITALS — BP 128/63 | HR 51 | Temp 98.4°F | Resp 20 | Ht 71.0 in | Wt 238.6 lb

## 2017-02-09 DIAGNOSIS — C8333 Diffuse large B-cell lymphoma, intra-abdominal lymph nodes: Secondary | ICD-10-CM

## 2017-02-09 DIAGNOSIS — Z8572 Personal history of non-Hodgkin lymphomas: Secondary | ICD-10-CM | POA: Diagnosis not present

## 2017-02-09 NOTE — Progress Notes (Signed)
  [place] OFFICE PROGRESS NOTE   Diagnosis: Non-Hodgkin's lymphoma  INTERVAL HISTORY:   Adam Alvarado returns as scheduled. He feels well. Good appetite and energy level. No palpable lymph nodes. No abdominal pain. He continues to have decreased hair growth.  Objective:  Vital signs in last 24 hours:  Blood pressure 128/63, pulse (!) 51, temperature 98.4 F (36.9 C), temperature source Oral, resp. rate 20, height '5\' 11"'$  (1.803 m), weight 238 lb 9.6 oz (108.2 kg), SpO2 98 %.    HEENT: Neck without mass Lymphatics: No cervical, supraclavicular, axillary, or inguinal nodes Resp: Lungs clear bilaterally Cardio: Regular rate and rhythm GI: No hepatosplenomegaly, no mass, nontender Vascular: No leg edema  Skin: Partial hair regrowth in the axillary and pubic areas.  Medications: I have reviewed the patient's current medications.  Assessment/Plan: 1. Non-Hodgkin's lymphoma-Large abdominal mass surrounding small lymph nodes status post CT-guided biopsy on 07/20/2012 with the pathology suggestive of non-Hodgkin's lymphoma. Status post laparoscopic biopsy of the left abdominal mass on 08/21/2011 with final pathology confirming a large B-cell lymphoma, CD20 positive, with both follicular and diffuse patterns. Negative staging bone marrow biopsy. Status post cycle 1 CHOP/Rituxan on 09/06/2012. He completed cycle 5 on 12/10/2012 with Neulasta support. He completed cycle 6 with Neulasta support on 01/09/2013 -Restaging PET scan on 36/64/4034-[id number] improved metabolic activity associated with a prevascular node and jejunal mesenteric nodal mass compared to the PET scan 09/05/2012, stable prevascular node and jejunal nodal mass compared to the CT from 11/20/2012 . Review of the 01/30/2013 PET scan in radiology was consistent with a complete hypermetabolic response  -Restaging PET scan 02/10/2014 improvement in size and metabolic activity associated with a residual prevascular lymph  node and jejunal mesentery nodal mass  2. Nausea/vomiting following cycle 1 CHOP/Rituxan. Antiemetic regimen was adjusted with cycle 2 to include Aloxi, Decadron and Emend.  3. Status post Port-A-Cath placement 08/30/2012. Status post Port-A-Cath removal 11/26/2012. 4. Neutropenia following cycle 2 CHOP/Rituxan. Neulasta was added beginning with cycle 3. 5. Hiccups following treatment.  6. "Night sweats "-etiology unclear. Resolved after removal of the Port-A-Cath 7. Hospitalization 11/15/2012 through 11/27/2012 with fever. No source of infection identified. Port-A-Cath was removed on 11/26/2012. He completed a course of IV antibiotics 12/06/2012. 8. Admission with a syncope event on 12/17/2012, day 8 following cycle 5 CHOP-Rituxan 9. fever and neutropenia following cycle 5 CHOP-Rituxan, no source for infection was identified. A PICC was removed  10. Hypermetabolic activity in the distal esophagus on the PET scan 09/05/2012 and 01/30/2013-? Significant , upper endoscopy 02/26/2013 revealed esophagitis and no mass. He remains on antacid therapy. residual mild hypermetabolism associated with a hiatal hernia, improved on the PET scan 02/10/2014  11. Alopecia-Total body, partially improved 12. Gout    Disposition:  Adam Alvarado remains in clinical remission from non-Hodgkin's lymphoma. He would like to continue follow-up at the [facility]. He will return for an office visit in one year. He will contact us in the interim as needed.  15 minutes were spent with the patient today. The majority of the time was used for counseling and coordination of care.  Adam Romberg, MD  02/09/2017  12:38 PM

## 2017-02-13 ENCOUNTER — Telehealth: Payer: Self-pay | Admitting: Oncology

## 2017-02-13 NOTE — Telephone Encounter (Signed)
Appointment schedule and confirmed with patient's wife, Hassan Rowan.

## 2017-09-13 DIAGNOSIS — Z79899 Other long term (current) drug therapy: Secondary | ICD-10-CM | POA: Diagnosis not present

## 2017-09-13 DIAGNOSIS — Z125 Encounter for screening for malignant neoplasm of prostate: Secondary | ICD-10-CM | POA: Diagnosis not present

## 2017-09-13 DIAGNOSIS — R7301 Impaired fasting glucose: Secondary | ICD-10-CM | POA: Diagnosis not present

## 2017-09-13 DIAGNOSIS — C8590 Non-Hodgkin lymphoma, unspecified, unspecified site: Secondary | ICD-10-CM | POA: Diagnosis not present

## 2017-09-13 DIAGNOSIS — N183 Chronic kidney disease, stage 3 (moderate): Secondary | ICD-10-CM | POA: Diagnosis not present

## 2017-09-19 ENCOUNTER — Telehealth: Payer: Self-pay

## 2017-09-19 DIAGNOSIS — Z23 Encounter for immunization: Secondary | ICD-10-CM | POA: Diagnosis not present

## 2017-09-19 DIAGNOSIS — Z0001 Encounter for general adult medical examination with abnormal findings: Secondary | ICD-10-CM | POA: Diagnosis not present

## 2017-09-19 DIAGNOSIS — N183 Chronic kidney disease, stage 3 (moderate): Secondary | ICD-10-CM | POA: Diagnosis not present

## 2017-09-19 DIAGNOSIS — Z6835 Body mass index (BMI) 35.0-35.9, adult: Secondary | ICD-10-CM | POA: Diagnosis not present

## 2017-09-19 DIAGNOSIS — R7303 Prediabetes: Secondary | ICD-10-CM | POA: Diagnosis not present

## 2017-09-19 NOTE — Telephone Encounter (Signed)
Do to job unable to come that day. Rescheduled for the 12th. Per 2/5 voice message

## 2018-02-09 ENCOUNTER — Ambulatory Visit: Payer: BLUE CROSS/BLUE SHIELD | Admitting: Oncology

## 2018-02-23 ENCOUNTER — Inpatient Hospital Stay: Payer: BLUE CROSS/BLUE SHIELD | Attending: Oncology | Admitting: Oncology

## 2018-02-23 VITALS — BP 111/64 | HR 62 | Temp 98.3°F | Resp 18 | Ht 71.0 in | Wt 238.2 lb

## 2018-02-23 DIAGNOSIS — Z9221 Personal history of antineoplastic chemotherapy: Secondary | ICD-10-CM | POA: Diagnosis not present

## 2018-02-23 DIAGNOSIS — Z8572 Personal history of non-Hodgkin lymphomas: Secondary | ICD-10-CM | POA: Diagnosis not present

## 2018-02-23 DIAGNOSIS — C8333 Diffuse large B-cell lymphoma, intra-abdominal lymph nodes: Secondary | ICD-10-CM

## 2018-02-23 NOTE — Progress Notes (Signed)
[place] OFFICE PROGRESS NOTE   Diagnosis: Non-Hodgkin's lymphoma  INTERVAL HISTORY:   [name] returns for a scheduled visit.  He feels well.  Good appetite and energy level.  No palpable lymph nodes.  No abdominal pain.  He continues to have partial regrowth of hair.  Objective:  Vital signs in last 24 hours:  Blood pressure 111/64, pulse 62, temperature 98.3 F (36.8 C), temperature source Oral, resp. rate 18, height 5' 11"  (1.803 m), weight 238 lb 3.2 oz (108 kg), SpO2 97 %.    HEENT: Neck without mass Lymphatics: No cervical, supraclavicular, axillary, or inguinal nodes Resp: Lungs clear bilaterally Cardio: Regular rate and rhythm GI: No mass, nontender, no hepatosplenomegaly Vascular: No leg edema  Skin: Mild alopecia over the trunk and extremities  Portacath/PICC-without erythema  Lab Results:  Lab Results  Component Value Date   WBC 5.4 01/09/2014   HGB 15.1 01/09/2014   HCT 45.9 01/09/2014   MCV 89.5 01/09/2014   PLT 199 01/09/2014   NEUTROABS 3.7 01/09/2014    CMP  Lab Results  Component Value Date   NA 143 01/09/2014   K 4.3 01/09/2014   CL 108 (H) 01/09/2013   CO2 22 01/09/2014   GLUCOSE 95 01/09/2014   BUN 16.7 01/09/2014   CREATININE 1.3 01/09/2014   CALCIUM 9.1 01/09/2014   PROT 6.7 01/09/2014   ALBUMIN 4.2 01/09/2014   AST 24 01/09/2014   ALT 31 01/09/2014   ALKPHOS 81 01/09/2014   BILITOT 0.74 01/09/2014   GFRNONAA 81 (L) 12/19/2012   GFRAA >90 12/19/2012    No results found for: CEA1  Lab Results  Component Value Date   INR 1.15 11/26/2012    Imaging:  No results found.  Medications: I have reviewed the patient's current medications.   Assessment/Plan: 1. Non-Hodgkin's lymphoma-Large abdominal mass surrounding small lymph nodes status post CT-guided biopsy on 07/20/2012 with the pathology suggestive of non-Hodgkin's lymphoma. Status post laparoscopic biopsy of the left abdominal mass on 08/21/2011 with  final pathology confirming a large B-cell lymphoma, CD20 positive, with both follicular and diffuse patterns. Negative staging bone marrow biopsy. Status post cycle 1 CHOP/Rituxan on 09/06/2012. He completed cycle 5 on 12/10/2012 with Neulasta support. He completed cycle 6 with Neulasta support on 01/09/2013 -Restaging PET scan on 36/46/[id number] improved metabolic activity associated with a prevascular node and jejunal mesenteric nodal mass compared to the PET scan 09/05/2012, stable prevascular node and jejunal nodal mass compared to the CT from 11/20/2012 . Review of the 01/30/2013 PET scan in radiology was consistent with a complete hypermetabolic response  -Restaging PET scan 02/10/2014 improvement in size and metabolic activity associated with a residual prevascular lymph node and jejunal mesentery nodal mass  2. Nausea/vomiting following cycle 1 CHOP/Rituxan. Antiemetic regimen was adjusted with cycle 2 to include Aloxi, Decadron and Emend.  3. Status post Port-A-Cath placement 08/30/2012. Status post Port-A-Cath removal 11/26/2012. 4. Neutropenia following cycle 2 CHOP/Rituxan. Neulasta was added beginning with cycle 3. 5. Hiccups following treatment.  6. "Night sweats "-etiology unclear. Resolved after removal of the Port-A-Cath 7. Hospitalization 11/15/2012 through 11/27/2012 with fever. No source of infection identified. Port-A-Cath was removed on 11/26/2012. He completed a course of IV antibiotics 12/06/2012. 8. Admission with a syncope event on 12/17/2012, day 8 following cycle 5 CHOP-Rituxan 9. fever and neutropenia following cycle 5 CHOP-Rituxan, no source for infection was identified. A PICC was removed  10. Hypermetabolic activity in the distal esophagus on the PET scan 09/05/2012 and  01/30/2013-? Significant , upper endoscopy 02/26/2013 revealed esophagitis and no mass. He remains on antacid therapy. residual mild hypermetabolism associated with a hiatal hernia, improved on the  PET scan 02/10/2014  11. Alopecia-Total body, partially improved 12. Gout    Disposition: [name] remains in clinical remission from non-Hodgkin's lymphoma.  He is now greater than 5 years out from diagnosis.  He was discharged from the medical oncology clinic today.  He will continue clinical follow-up with Dr. Willey Blade.  I am available to see him in the future as needed. He will stay up-to-date on the influenza vaccines.  He last had a 23 valent Pneumovax in 2015.  I recommend a repeat Pneumovax in 2020.  15 minutes were spent with the patient today.  The majority of the time was used for counseling and coordination of care.  Betsy Coder, MD  02/23/2018  11:02 AM

## 2018-02-26 ENCOUNTER — Telehealth: Payer: Self-pay

## 2018-02-26 NOTE — Telephone Encounter (Signed)
Per 7/15 no los °

## 2018-09-27 DIAGNOSIS — K219 Gastro-esophageal reflux disease without esophagitis: Secondary | ICD-10-CM | POA: Diagnosis not present

## 2018-09-27 DIAGNOSIS — N183 Chronic kidney disease, stage 3 (moderate): Secondary | ICD-10-CM | POA: Diagnosis not present

## 2018-09-27 DIAGNOSIS — C8590 Non-Hodgkin lymphoma, unspecified, unspecified site: Secondary | ICD-10-CM | POA: Diagnosis not present

## 2018-09-27 DIAGNOSIS — R7303 Prediabetes: Secondary | ICD-10-CM | POA: Diagnosis not present

## 2018-09-27 DIAGNOSIS — Z125 Encounter for screening for malignant neoplasm of prostate: Secondary | ICD-10-CM | POA: Diagnosis not present

## 2018-09-27 DIAGNOSIS — Z79899 Other long term (current) drug therapy: Secondary | ICD-10-CM | POA: Diagnosis not present

## 2018-10-04 DIAGNOSIS — Z0001 Encounter for general adult medical examination with abnormal findings: Secondary | ICD-10-CM | POA: Diagnosis not present

## 2018-10-04 DIAGNOSIS — R7303 Prediabetes: Secondary | ICD-10-CM | POA: Diagnosis not present

## 2018-10-04 DIAGNOSIS — Z23 Encounter for immunization: Secondary | ICD-10-CM | POA: Diagnosis not present

## 2018-10-04 DIAGNOSIS — N183 Chronic kidney disease, stage 3 (moderate): Secondary | ICD-10-CM | POA: Diagnosis not present

## 2018-10-04 DIAGNOSIS — Z6834 Body mass index (BMI) 34.0-34.9, adult: Secondary | ICD-10-CM | POA: Diagnosis not present

## 2019-07-18 DIAGNOSIS — D225 Melanocytic nevi of trunk: Secondary | ICD-10-CM | POA: Diagnosis not present

## 2019-07-18 DIAGNOSIS — Z1283 Encounter for screening for malignant neoplasm of skin: Secondary | ICD-10-CM | POA: Diagnosis not present

## 2019-08-22 DIAGNOSIS — Z23 Encounter for immunization: Secondary | ICD-10-CM | POA: Diagnosis not present

## 2019-09-19 DIAGNOSIS — Z23 Encounter for immunization: Secondary | ICD-10-CM | POA: Diagnosis not present

## 2019-10-15 DIAGNOSIS — Z8572 Personal history of non-Hodgkin lymphomas: Secondary | ICD-10-CM | POA: Diagnosis not present

## 2019-10-15 DIAGNOSIS — Z125 Encounter for screening for malignant neoplasm of prostate: Secondary | ICD-10-CM | POA: Diagnosis not present

## 2019-10-15 DIAGNOSIS — N183 Chronic kidney disease, stage 3 unspecified: Secondary | ICD-10-CM | POA: Diagnosis not present

## 2019-10-15 DIAGNOSIS — R7303 Prediabetes: Secondary | ICD-10-CM | POA: Diagnosis not present

## 2019-10-15 DIAGNOSIS — K219 Gastro-esophageal reflux disease without esophagitis: Secondary | ICD-10-CM | POA: Diagnosis not present

## 2019-10-15 DIAGNOSIS — Z79899 Other long term (current) drug therapy: Secondary | ICD-10-CM | POA: Diagnosis not present

## 2019-10-22 DIAGNOSIS — M109 Gout, unspecified: Secondary | ICD-10-CM | POA: Diagnosis not present

## 2019-10-22 DIAGNOSIS — Z0001 Encounter for general adult medical examination with abnormal findings: Secondary | ICD-10-CM | POA: Diagnosis not present

## 2019-10-22 DIAGNOSIS — N1831 Chronic kidney disease, stage 3a: Secondary | ICD-10-CM | POA: Diagnosis not present

## 2019-10-22 DIAGNOSIS — Z8639 Personal history of other endocrine, nutritional and metabolic disease: Secondary | ICD-10-CM | POA: Diagnosis not present

## 2019-12-16 DIAGNOSIS — H40003 Preglaucoma, unspecified, bilateral: Secondary | ICD-10-CM | POA: Diagnosis not present

## 2019-12-20 DIAGNOSIS — M10072 Idiopathic gout, left ankle and foot: Secondary | ICD-10-CM | POA: Diagnosis not present

## 2020-04-09 DIAGNOSIS — Z20828 Contact with and (suspected) exposure to other viral communicable diseases: Secondary | ICD-10-CM | POA: Diagnosis not present

## 2020-06-29 DIAGNOSIS — M109 Gout, unspecified: Secondary | ICD-10-CM | POA: Diagnosis not present

## 2020-08-10 DIAGNOSIS — L820 Inflamed seborrheic keratosis: Secondary | ICD-10-CM | POA: Diagnosis not present

## 2020-08-10 DIAGNOSIS — Z1283 Encounter for screening for malignant neoplasm of skin: Secondary | ICD-10-CM | POA: Diagnosis not present

## 2020-08-10 DIAGNOSIS — D225 Melanocytic nevi of trunk: Secondary | ICD-10-CM | POA: Diagnosis not present

## 2020-08-21 ENCOUNTER — Telehealth: Payer: Self-pay

## 2020-08-21 DIAGNOSIS — Z8572 Personal history of non-Hodgkin lymphomas: Secondary | ICD-10-CM | POA: Diagnosis not present

## 2020-08-21 DIAGNOSIS — R1110 Vomiting, unspecified: Secondary | ICD-10-CM | POA: Diagnosis not present

## 2020-08-21 DIAGNOSIS — R5383 Other fatigue: Secondary | ICD-10-CM | POA: Diagnosis not present

## 2020-08-21 DIAGNOSIS — Z79899 Other long term (current) drug therapy: Secondary | ICD-10-CM | POA: Diagnosis not present

## 2020-08-21 DIAGNOSIS — R1112 Projectile vomiting: Secondary | ICD-10-CM | POA: Diagnosis not present

## 2020-08-21 NOTE — Telephone Encounter (Signed)
-----   Message from Ladene Artist, MD sent at 08/21/2020 11:55 AM EST ----- Dr. Willey Blade called to report pt is having recurrent vomiting over the past few weeks. Blood work ordered for today and CT AP is being scheduled. Please contact pt to schedule an office visit with me or APP early next week. If no appts available then OK to direct schedule an EGD at least 1 day after CT AP completed. Thanks.

## 2020-08-21 NOTE — Telephone Encounter (Signed)
Patient scheduled to see Alonza Bogus, PA on 08/24/20 at 2:30pm.

## 2020-08-24 ENCOUNTER — Encounter: Payer: Self-pay | Admitting: Gastroenterology

## 2020-08-24 ENCOUNTER — Ambulatory Visit (INDEPENDENT_AMBULATORY_CARE_PROVIDER_SITE_OTHER): Payer: BC Managed Care – PPO | Admitting: Gastroenterology

## 2020-08-24 VITALS — BP 140/62 | HR 72 | Ht 70.0 in | Wt 236.2 lb

## 2020-08-24 DIAGNOSIS — R5383 Other fatigue: Secondary | ICD-10-CM

## 2020-08-24 DIAGNOSIS — R112 Nausea with vomiting, unspecified: Secondary | ICD-10-CM | POA: Diagnosis not present

## 2020-08-24 DIAGNOSIS — Z8572 Personal history of non-Hodgkin lymphomas: Secondary | ICD-10-CM | POA: Diagnosis not present

## 2020-08-24 DIAGNOSIS — R6881 Early satiety: Secondary | ICD-10-CM | POA: Diagnosis not present

## 2020-08-24 DIAGNOSIS — R140 Abdominal distension (gaseous): Secondary | ICD-10-CM

## 2020-08-24 NOTE — Patient Instructions (Signed)
If you are age 64 or older, your body mass index should be between 23-30. Your Body mass index is 33.89 kg/m. If this is out of the aforementioned range listed, please consider follow up with your Primary Care Provider.  If you are age 70 or younger, your body mass index should be between 19-25. Your Body mass index is 33.89 kg/m. If this is out of the aformentioned range listed, please consider follow up with your Primary Care Provider.   You have been scheduled for an endoscopy. Please follow written instructions given to you at your visit today. If you use inhalers (even only as needed), please bring them with you on the day of your procedure.  You have been scheduled for a CT scan of the abdomen and pelvis at [facility], 1st floor Radiology. You are scheduled on 09/03/2020  at 8:30 am. You should arrive 15 minutes prior to your appointment time for registration.  Please pick up 2 bottles of contrast from [facility] at least 3 days prior to your scan. The solution may taste better if refrigerated, but do NOT add ice or any other liquid to this solution. Shake well before drinking.   Please follow the written instructions below on the day of your exam:   1) Do not eat anything after 4:30 am (4 hours prior to your test)   2) Drink 1 bottle of contrast @ 6:30 am (2 hours prior to your exam)  Remember to shake well before drinking and do NOT pour over ice.     Drink 1 bottle of contrast @ 7:30 am (1 hour prior to your exam)   You may take any medications as prescribed with a small amount of water, if necessary. If you take any of the following medications: METFORMIN, GLUCOPHAGE, GLUCOVANCE, AVANDAMET, RIOMET, FORTAMET, [place] MET, JANUMET, GLUMETZA or METAGLIP, you MAY be asked to HOLD this medication 48 hours AFTER the exam.   The purpose of you drinking the oral contrast is to aid in the visualization of your intestinal tract. The contrast solution may cause some diarrhea. Depending  on your individual set of symptoms, you may also receive an intravenous injection of x-ray contrast/dye. Plan on being at [facility] for 45 minutes or longer, depending on the type of exam you are having performed.   If you have any questions regarding your exam or if you need to reschedule, you may call [facility] at [phone] between the hours of 8:00 am and 5:00 pm, Monday-Friday.   Follow up pending the results of your Endoscopy and CT, or as needed.  Thank you for entrusting me with your care and choosing [facility].  Alonza Bogus, PA-C

## 2020-08-24 NOTE — Progress Notes (Signed)
08/24/2020 POSEY PETRIK [id number] 1956-07-31   HISTORY OF PRESENT ILLNESS: This is a 65 year old male who is a patient of Dr. Lynne Leader. He has limited past medical history but does have history of non-Hodgkin's lymphoma. He follows here regularly for his colonoscopies. Last colonoscopy was in September 2017 at which time he was found to have 3 polyps that were removed and were tubular adenomas. He also had internal hemorrhoids. Repeat was recommended a 5-year interval.  He is here today with complaints of nausea, intermittent random episodes of vomiting, sensation of fullness/bloating/early satiety with eating, and extreme fatigue for the past month or so. He says this all started abruptly at the beginning of December. He says that every time he eats he feels nauseous and feels full very quickly and very bloated with a lot of belching and flatulence. He says that he has had random episodes of vomiting about 5 different times that have just been projectile after eating something as small as a little bowl of cereal. He reports being extremely fatigued. Labs performed at PCPs office show CBC and CMP to be fairly unremarkable. He denies any sensations of heartburn or reflux. He takes Tagamet and says that that has always seemed to work really well for his heartburn/reflux. He had an EGD back in July 2014 at which time he was found to have grade A esophagitis  Referred here by Dr. Willey Blade.   Past Medical History:  Diagnosis Date  . Adenomatous colon polyp 05/2000  . Cancer [facility]) 2013   Non-Hodgkins Lymphoma  . H/O hiatal hernia    small seen on CT Scan  . Hemorrhoid   . Other malignant lymphomas, unspecified site, extranodal and solid organ sites 2013   Past Surgical History:  Procedure Laterality Date  . BIOPSY  08/20/2012   Procedure: BIOPSY;  Surgeon: Adin Hector, MD;  Location: [facility];  Service: General;  Laterality: N/A;  Laparoscopic biopsy of abdominal mesenteric mass  .  LAPAROSCOPY  08/20/2012   Procedure: LAPAROSCOPY DIAGNOSTIC;  Surgeon: Adin Hector, MD;  Location: [facility];  Service: General;  Laterality: N/A;  . [place]    reports that he has never smoked. He has never used smokeless tobacco. He reports current alcohol use. He reports that he does not use drugs. family history includes Breast cancer in his brother; Cancer in his mother. No Known Allergies    Outpatient Encounter Medications as of 08/24/2020  Medication Sig  . cimetidine (TAGAMET) 200 MG tablet Take 200 mg by mouth daily.   Marland Kitchen aspirin 325 MG tablet Take 2 tablets (650 mg total) by mouth every 6 (six) hours as needed for pain or fever. (Patient not taking: No sig reported)  . [DISCONTINUED] 0.9 %  sodium chloride infusion    No facility-administered encounter medications on file as of 08/24/2020.     REVIEW OF SYSTEMS  : All other systems reviewed and negative except where noted in the History of Present Illness.   PHYSICAL EXAM: BP 140/62   Pulse 72   Ht 5\' 10"  (1.778 m)   Wt 236 lb 3.2 oz (107.1 kg)   SpO2 96%   BMI 33.89 kg/m  General: Well developed white male in no acute distress Head: Normocephalic and atraumatic Eyes:  Sclerae anicteric, conjunctiva pink. Ears: Normal auditory acuity Lungs: Clear throughout to auscultation; no W/R/R. Heart: Regular rate and rhythm; no M/R/G. Abdomen: Soft, non-distended.  BS present.  Non-tender. Musculoskeletal: Symmetrical with no gross  deformities  Skin: No lesions on visible extremities Extremities: No edema  Neurological: Alert oriented x 4, grossly non-focal Psychological:  Alert and cooperative. Normal mood and affect  ASSESSMENT AND PLAN: *65 year old male with history of Non-hodgkins lymphoma who presents with complaints of onset of nausea, vomiting, fullness/bloating/early satiety, and extreme fatigue about a month ago. The vomiting is intermittent, but always feels nauseous, full, bloated after eating. Does not  feel like he has heartburn/reflux. Apparently Dr. Willey Blade and Dr. Fuller Plan spoke. We'll plan for CT scan of the abdomen and pelvis with contrast as well as EGD with Dr. Fuller Plan.  The risks, benefits, and alternatives to EGD were discussed with the patient and he consents to proceed.    CC:  Asencion Noble, MD

## 2020-08-25 NOTE — Progress Notes (Signed)
Reviewed and agree with management plan.   T. , MD FACG [facility]  

## 2020-08-27 ENCOUNTER — Ambulatory Visit (AMBULATORY_SURGERY_CENTER): Payer: BC Managed Care – PPO | Admitting: Gastroenterology

## 2020-08-27 ENCOUNTER — Encounter: Payer: Self-pay | Admitting: Gastroenterology

## 2020-08-27 ENCOUNTER — Other Ambulatory Visit: Payer: Self-pay

## 2020-08-27 VITALS — BP 114/49 | HR 40 | Temp 97.6°F | Resp 18 | Ht 70.0 in | Wt 236.0 lb

## 2020-08-27 DIAGNOSIS — K2270 Barrett's esophagus without dysplasia: Secondary | ICD-10-CM | POA: Diagnosis not present

## 2020-08-27 DIAGNOSIS — K2980 Duodenitis without bleeding: Secondary | ICD-10-CM

## 2020-08-27 DIAGNOSIS — R112 Nausea with vomiting, unspecified: Secondary | ICD-10-CM

## 2020-08-27 DIAGNOSIS — K449 Diaphragmatic hernia without obstruction or gangrene: Secondary | ICD-10-CM | POA: Diagnosis not present

## 2020-08-27 DIAGNOSIS — K2100 Gastro-esophageal reflux disease with esophagitis, without bleeding: Secondary | ICD-10-CM

## 2020-08-27 DIAGNOSIS — R6881 Early satiety: Secondary | ICD-10-CM | POA: Diagnosis not present

## 2020-08-27 MED ORDER — PANTOPRAZOLE SODIUM 40 MG PO TBEC: 40.0000 mg | DELAYED_RELEASE_TABLET | Freq: Two times a day (BID) | ORAL | 3 refills | Status: DC

## 2020-08-27 MED ORDER — SODIUM CHLORIDE 0.9 % IV SOLN: 500.0000 mL | Freq: Once | INTRAVENOUS | Status: DC

## 2020-08-27 NOTE — Progress Notes (Signed)
PT taken to PACU. Monitors in place. VSS. Report given to RN. 

## 2020-08-27 NOTE — Op Note (Signed)
[facility] Patient Name: Adam Alvarado Procedure Date: 08/27/2020 2:56 PM MRN: [id number] Endoscopist: Ladene Artist , MD Age: 65 Referring MD:  Date of Birth: 05-Mar-1956 Gender: Male Account #: [id number] Procedure:                Upper GI endoscopy Indications:              Early satiety, Nausea with vomiting Medicines:                Monitored Anesthesia Care Procedure:                Pre-Anesthesia Assessment:                           - Prior to the procedure, a History and Physical                            was performed, and patient medications and                            allergies were reviewed. The patient's tolerance of                            previous anesthesia was also reviewed. The risks                            and benefits of the procedure and the sedation                            options and risks were discussed with the patient.                            All questions were answered, and informed consent                            was obtained. Prior Anticoagulants: The patient has                            taken no previous anticoagulant or antiplatelet                            agents. ASA Grade Assessment: II - A patient with                            mild systemic disease. After reviewing the risks                            and benefits, the patient was deemed in                            satisfactory condition to undergo the procedure.                           After obtaining informed consent, the endoscope was  passed under direct vision. Throughout the                            procedure, the patient's blood pressure, pulse, and                            oxygen saturations were monitored continuously. The                            Endoscope was introduced through the mouth, and                            advanced to the second part of duodenum. The upper                            GI endoscopy was  accomplished without difficulty.                            The patient tolerated the procedure well. Scope In: Scope Out: Findings:                 LA Grade A (one or more mucosal breaks less than 5                            mm, not extending between tops of 2 mucosal folds)                            esophagitis with no bleeding was found at the                            gastroesophageal junction.                           The Z-line was variable and was found 38 cm from                            the incisors. Biopsies were taken with a cold                            forceps for histology.                           The exam of the esophagus was otherwise normal.                           A medium-sized hiatal hernia was present.                           The exam of the stomach was otherwise normal.                            Biopsies were taken with a cold forceps for  histology.                           Two 6 to 10 mm mucosal erythematous nodules with a                            localized distribution were found in the duodenal                            bulb. Biopsies were taken with a cold forceps for                            histology.                           The exam of the duodenum was otherwise normal. Complications:            No immediate complications. Estimated Blood Loss:     Estimated blood loss was minimal. Impression:               - LA Grade A reflux esophagitis with no bleeding.                           - Z-line variable, 38 cm from the incisors.                            Biopsied.                           - Normal gastric mucosa. Biopsied.                           - Medium-sized hiatal hernia.                           - Two mucosal nodules found in the duodenum.                            Biopsied. Recommendation:           - Patient has a contact number available for                            emergencies. The signs and  symptoms of potential                            delayed complications were discussed with the                            patient. Return to normal activities tomorrow.                            Written discharge instructions were provided to the                            patient.                           -  Resume previous diet.                           - Closely follow antireflux measures.                           - Continue present medications.                           - Pantoprazole 40 mg po bid for 1 month, then 40 mg                            po qam long term, 1 year of refills.                           - DC Tagamet.                           - Await pathology results.                           - Return to GI office in 6 weeks. Ladene Artist, MD 08/27/2020 3:27:56 PM This report has been signed electronically.

## 2020-08-27 NOTE — Patient Instructions (Signed)
Information on YOU HAD AN ENDOSCOPIC PROCEDURE TODAY AT [facility]:   Refer to the procedure report that was given to you for any specific questions about what was found during the examination.  If the procedure report does not answer your questions, please call your gastroenterologist to clarify.  If you requested that your care partner not be given the details of your procedure findings, then the procedure report has been included in a sealed envelope for you to review at your convenience later.  YOU SHOULD EXPECT: Some feelings of bloating in the abdomen. Passage of more gas than usual.  Walking can help get rid of the air that was put into your GI tract during the procedure and reduce the bloating. If you had a lower endoscopy (such as a colonoscopy or flexible sigmoidoscopy) you may notice spotting of blood in your stool or on the toilet paper. If you underwent a bowel prep for your procedure, you may not have a normal bowel movement for a few days.  Please Note:  You might notice some irritation and congestion in your nose or some drainage.  This is from the oxygen used during your procedure.  There is no need for concern and it should clear up in a day or so.  SYMPTOMS TO REPORT IMMEDIATELY:    Following upper endoscopy (EGD)  Vomiting of blood or coffee ground material  New chest pain or pain under the shoulder blades  Painful or persistently difficult swallowing  New shortness of breath  Fever of 100F or higher  Black, tarry-looking stools  For urgent or emergent issues, a gastroenterologist can be reached at any hour by calling [phone]. Do not use MyChart messaging for urgent concerns.    DIET:  We do recommend a small meal at first, but then you may proceed to your regular diet.  Drink plenty of fluids but you should avoid alcoholic beverages for 24 hours.  ACTIVITY:  You should plan to take it easy for the rest of today and you should NOT DRIVE or use  heavy machinery until tomorrow (because of the sedation medicines used during the test).    FOLLOW UP: Our staff will call the number listed on your records 48-72 hours following your procedure to check on you and address any questions or concerns that you may have regarding the information given to you following your procedure. If we do not reach you, we will leave a message.  We will attempt to reach you two times.  During this call, we will ask if you have developed any symptoms of COVID 19. If you develop any symptoms (ie: fever, flu-like symptoms, shortness of breath, cough etc.) before then, please call [phone].  If you test positive for Covid 19 in the 2 weeks post procedure, please call and report this information to [place].    If any biopsies were taken you will be contacted by phone or by letter within the next 1-3 weeks.  Please call us at [phone] if you have not heard about the biopsies in 3 weeks.    SIGNATURES/CONFIDENTIALITY: You and/or your care partner have signed paperwork which will be entered into your electronic medical record.  These signatures attest to the fact that that the information above on your After Visit Summary has been reviewed and is understood.  Full responsibility of the confidentiality of this discharge information lies with you and/or your care-partner. esophagitis and hiatal hernias given to you today.  Await pathology  results.  Stop Tagamet.  Start Pantoprazole 40 mg twice a day for 1 month.  Then go to 40 mg every morning for the long term.  Return to GI office in 6 months.

## 2020-08-27 NOTE — Progress Notes (Signed)
Vitals-CW ?

## 2020-08-27 NOTE — Progress Notes (Signed)
Called to room to assist during endoscopic procedure.  Patient ID and intended procedure confirmed with present staff. Received instructions for my participation in the procedure from the performing physician.  

## 2020-09-01 ENCOUNTER — Telehealth: Payer: Self-pay

## 2020-09-01 ENCOUNTER — Telehealth: Payer: Self-pay | Admitting: Gastroenterology

## 2020-09-01 NOTE — Telephone Encounter (Signed)
Pt's wife is requesting a call back from a nurse to discuss the denial from the pt's insurance for the pt's CT Scan. Caller would like to know what the doctor would like to do next.

## 2020-09-01 NOTE — Telephone Encounter (Signed)
Please schedule abd [place] in place of CT AP. See recent office note and EGD report.  Closely follow antireflux measures. Pantoprazole 40 mg po bid for 1 month, then 40 mg po qam long term, 1 year of refills.  REV when he is available.

## 2020-09-01 NOTE — Telephone Encounter (Signed)
Attempted to reach pt. With follow-up call following endoscopic procedure 08/27/2020.  Unable to LM, pt.'s voice mail not set up.  Will try to reach pt. Again later today.

## 2020-09-01 NOTE — Telephone Encounter (Signed)
CT was denied by [facility].( don't see any documentation why).  I have tried to schedule a follow up with the patient and he is not available due to his work until 11/09/20.  Please advise is want alternate to CT

## 2020-09-02 ENCOUNTER — Other Ambulatory Visit: Payer: Self-pay

## 2020-09-02 DIAGNOSIS — R5383 Other fatigue: Secondary | ICD-10-CM

## 2020-09-02 DIAGNOSIS — R112 Nausea with vomiting, unspecified: Secondary | ICD-10-CM

## 2020-09-02 DIAGNOSIS — R6881 Early satiety: Secondary | ICD-10-CM

## 2020-09-02 DIAGNOSIS — Z8572 Personal history of non-Hodgkin lymphomas: Secondary | ICD-10-CM

## 2020-09-02 NOTE — Telephone Encounter (Signed)
Patient notified of the [place] at 8:45 on 09/09/20 at [facility].  He is notified to arrive at 8:45 and be NPO after midnight.

## 2020-09-03 ENCOUNTER — Encounter: Payer: Self-pay | Admitting: Gastroenterology

## 2020-09-03 ENCOUNTER — Encounter (HOSPITAL_COMMUNITY): Payer: Self-pay

## 2020-09-03 ENCOUNTER — Ambulatory Visit (HOSPITAL_COMMUNITY): Payer: BC Managed Care – PPO

## 2020-09-09 ENCOUNTER — Ambulatory Visit (HOSPITAL_COMMUNITY)
Admission: RE | Admit: 2020-09-09 | Discharge: 2020-09-09 | Disposition: A | Discharge status: Home / Self Care | Payer: BC Managed Care – PPO | Source: Ambulatory Visit | Attending: Gastroenterology | Admitting: Gastroenterology

## 2020-09-09 ENCOUNTER — Other Ambulatory Visit: Payer: Self-pay

## 2020-09-09 DIAGNOSIS — R5383 Other fatigue: Secondary | ICD-10-CM | POA: Insufficient documentation

## 2020-09-09 DIAGNOSIS — K838 Other specified diseases of biliary tract: Secondary | ICD-10-CM | POA: Diagnosis not present

## 2020-09-09 DIAGNOSIS — R112 Nausea with vomiting, unspecified: Secondary | ICD-10-CM | POA: Insufficient documentation

## 2020-09-09 DIAGNOSIS — Z8572 Personal history of non-Hodgkin lymphomas: Secondary | ICD-10-CM | POA: Insufficient documentation

## 2020-09-09 DIAGNOSIS — R6881 Early satiety: Secondary | ICD-10-CM | POA: Insufficient documentation

## 2020-09-11 ENCOUNTER — Encounter: Payer: Self-pay | Admitting: Gastroenterology

## 2020-09-11 ENCOUNTER — Other Ambulatory Visit: Payer: Self-pay

## 2020-09-11 ENCOUNTER — Other Ambulatory Visit (INDEPENDENT_AMBULATORY_CARE_PROVIDER_SITE_OTHER): Payer: BC Managed Care – PPO

## 2020-09-11 DIAGNOSIS — K838 Other specified diseases of biliary tract: Secondary | ICD-10-CM

## 2020-09-11 DIAGNOSIS — R932 Abnormal findings on diagnostic imaging of liver and biliary tract: Secondary | ICD-10-CM | POA: Diagnosis not present

## 2020-09-11 LAB — HEPATIC FUNCTION PANEL
ALT: 20 U/L (ref 0–53)
AST: 16 U/L (ref 0–37)
Albumin: 4.3 g/dL (ref 3.5–5.2)
Alkaline Phosphatase: 63 U/L (ref 39–117)
Bilirubin, Direct: 0.1 mg/dL (ref 0.0–0.3)
Total Bilirubin: 0.4 mg/dL (ref 0.2–1.2)
Total Protein: 7 g/dL (ref 6.0–8.3)

## 2020-09-11 LAB — CREATININE, SERUM: Creatinine, Ser: 1.34 mg/dL (ref 0.40–1.50)

## 2020-09-11 LAB — BUN: BUN: 16 mg/dL (ref 6–23)

## 2020-09-23 ENCOUNTER — Ambulatory Visit (HOSPITAL_COMMUNITY)
Admission: RE | Admit: 2020-09-23 | Discharge: 2020-09-23 | Disposition: A | Discharge status: Home / Self Care | Payer: BC Managed Care – PPO | Source: Ambulatory Visit | Attending: Gastroenterology | Admitting: Gastroenterology

## 2020-09-23 ENCOUNTER — Other Ambulatory Visit: Payer: Self-pay

## 2020-09-23 ENCOUNTER — Other Ambulatory Visit: Payer: Self-pay | Admitting: Gastroenterology

## 2020-09-23 DIAGNOSIS — K838 Other specified diseases of biliary tract: Secondary | ICD-10-CM

## 2020-09-23 DIAGNOSIS — R932 Abnormal findings on diagnostic imaging of liver and biliary tract: Secondary | ICD-10-CM

## 2020-09-23 DIAGNOSIS — K760 Fatty (change of) liver, not elsewhere classified: Secondary | ICD-10-CM | POA: Diagnosis not present

## 2020-09-23 MED ORDER — GADOBUTROL 1 MMOL/ML IV SOLN
10.0000 mL | Freq: Once | INTRAVENOUS | Status: AC | PRN
  Administered 2020-09-23: 10 mL via INTRAVENOUS

## 2020-10-08 ENCOUNTER — Ambulatory Visit: Payer: BC Managed Care – PPO | Admitting: Gastroenterology

## 2020-11-09 ENCOUNTER — Ambulatory Visit (INDEPENDENT_AMBULATORY_CARE_PROVIDER_SITE_OTHER): Payer: BC Managed Care – PPO | Admitting: Gastroenterology

## 2020-11-09 ENCOUNTER — Encounter: Payer: Self-pay | Admitting: Gastroenterology

## 2020-11-09 ENCOUNTER — Other Ambulatory Visit: Payer: Self-pay

## 2020-11-09 VITALS — BP 136/76 | HR 61 | Ht 70.5 in | Wt 241.0 lb

## 2020-11-09 DIAGNOSIS — K760 Fatty (change of) liver, not elsewhere classified: Secondary | ICD-10-CM | POA: Diagnosis not present

## 2020-11-09 DIAGNOSIS — K2270 Barrett's esophagus without dysplasia: Secondary | ICD-10-CM

## 2020-11-09 DIAGNOSIS — K654 Sclerosing mesenteritis: Secondary | ICD-10-CM

## 2020-11-09 DIAGNOSIS — N183 Chronic kidney disease, stage 3 unspecified: Secondary | ICD-10-CM | POA: Diagnosis not present

## 2020-11-09 DIAGNOSIS — R7303 Prediabetes: Secondary | ICD-10-CM | POA: Diagnosis not present

## 2020-11-09 DIAGNOSIS — Z125 Encounter for screening for malignant neoplasm of prostate: Secondary | ICD-10-CM | POA: Diagnosis not present

## 2020-11-09 DIAGNOSIS — Z79899 Other long term (current) drug therapy: Secondary | ICD-10-CM | POA: Diagnosis not present

## 2020-11-09 DIAGNOSIS — M109 Gout, unspecified: Secondary | ICD-10-CM | POA: Diagnosis not present

## 2020-11-09 DIAGNOSIS — Z86010 Personal history of colonic polyps: Secondary | ICD-10-CM

## 2020-11-09 NOTE — Progress Notes (Signed)
    History of Present Illness: This is a 65 year old male returning for follow up of GERD with esophagitis, vomiting, mesenteric panniculitis.  He relates since beginning pantoprazole daily he has had no reflux symptoms and no episodes of vomiting.  He has no gastrointestinal complaints today.  EGD 08/2020 - LA Grade A reflux esophagitis with no bleeding. - Z-line variable, 38 cm from the incisors. Biopsied. Barrett's. - Normal gastric mucosa. Biopsied. Negative.  - Medium-sized hiatal hernia. - Two mucosal nodules found in the duodenum. Biopsied. Duodenitis.   Current Medications, Allergies, Past Medical History, Past Surgical History, Family History and Social History were reviewed in [employer] record.   Physical Exam: General: Well developed, well nourished, no acute distress Head: Normocephalic and atraumatic Eyes: Sclerae anicteric, EOMI Ears: Normal auditory acuity Mouth: Not examined, mask on during Covid-19 pandemic Lungs: Clear throughout to auscultation Heart: Regular rate and rhythm; no murmurs, rubs or bruits Abdomen: Soft, non tender and non distended. No masses, hepatosplenomegaly or hernias noted. Normal Bowel sounds Rectal: Not done Musculoskeletal: Symmetrical with no gross deformities  Pulses:  Normal pulses noted Extremities: No clubbing, cyanosis, edema or deformities noted Neurological: Alert oriented x 4, grossly nonfocal Psychological:  Alert and cooperative. Normal mood and affect   Assessment and Recommendations:  1. GERD, Barrett's short segment without dysplasia.  Follow antireflux measures long-term.  Continue pantoprazole 40 mg p.o. daily long-term.  Surveillance EGD for Barrett's is recommended in Jan 2025.  We discussed Barrett's esophagus management and GERD management.  I addressed his questions to his satisfaction.  2. Mesenteric panniculitis.  This appears to be asymptomatic.  Radiology recommended follow up CT AP in 3  months which will be in May.  We discussed this finding and I addressed his questions to his satisfaction.  3. Hepatic steatosis.  Normal LFTs.  Long-term carb modified, fat modified, weight loss diet supervised by his PCP.  Monitor LFTs intermittently, such as every 6 months, per his PCP.  We discussed this finding and I addressed his questions to his satisfaction.  4.  Personal history of adenomatous colon polyps.  Surveillance colonoscopy is recommended in September 2022.

## 2020-11-09 NOTE — Patient Instructions (Addendum)
You will be contacted by [facility] in the next 2 days to arrange a CT scan of the abdomen and pelvis.  The number on your caller ID will be [phone], please answer when they call.  If you have not heard from them in 2 days please call [phone] to schedule.     Thank you for choosing me and [facility].  [name]. Dagoberto Ligas., MD., [facility]

## 2020-11-16 DIAGNOSIS — Z0000 Encounter for general adult medical examination without abnormal findings: Secondary | ICD-10-CM | POA: Diagnosis not present

## 2020-11-16 DIAGNOSIS — R7309 Other abnormal glucose: Secondary | ICD-10-CM | POA: Diagnosis not present

## 2020-11-16 DIAGNOSIS — N1831 Chronic kidney disease, stage 3a: Secondary | ICD-10-CM | POA: Diagnosis not present

## 2020-11-16 DIAGNOSIS — K219 Gastro-esophageal reflux disease without esophagitis: Secondary | ICD-10-CM | POA: Diagnosis not present

## 2020-11-16 DIAGNOSIS — Z8572 Personal history of non-Hodgkin lymphomas: Secondary | ICD-10-CM | POA: Diagnosis not present

## 2020-12-16 ENCOUNTER — Ambulatory Visit (HOSPITAL_COMMUNITY)
Admission: RE | Admit: 2020-12-16 | Discharge: 2020-12-16 | Disposition: A | Discharge status: Home / Self Care | Payer: BC Managed Care – PPO | Source: Ambulatory Visit | Attending: Gastroenterology | Admitting: Gastroenterology

## 2020-12-16 DIAGNOSIS — K654 Sclerosing mesenteritis: Secondary | ICD-10-CM | POA: Diagnosis not present

## 2020-12-16 DIAGNOSIS — K760 Fatty (change of) liver, not elsewhere classified: Secondary | ICD-10-CM | POA: Diagnosis not present

## 2020-12-16 LAB — POCT I-STAT CREATININE: Creatinine, Ser: 1.5 mg/dL — ABNORMAL HIGH (ref 0.61–1.24)

## 2020-12-16 MED ORDER — IOHEXOL 300 MG/ML  SOLN
100.0000 mL | Freq: Once | INTRAMUSCULAR | Status: AC | PRN
  Administered 2020-12-16: 100 mL via INTRAVENOUS

## 2020-12-17 ENCOUNTER — Other Ambulatory Visit: Payer: Self-pay

## 2020-12-17 DIAGNOSIS — Z8572 Personal history of non-Hodgkin lymphomas: Secondary | ICD-10-CM

## 2020-12-17 DIAGNOSIS — R935 Abnormal findings on diagnostic imaging of other abdominal regions, including retroperitoneum: Secondary | ICD-10-CM

## 2020-12-28 ENCOUNTER — Other Ambulatory Visit: Payer: Self-pay

## 2020-12-28 ENCOUNTER — Inpatient Hospital Stay: Payer: BC Managed Care – PPO | Attending: Oncology | Admitting: Oncology

## 2020-12-28 VITALS — BP 150/69 | HR 60 | Temp 98.1°F | Resp 18 | Ht 70.0 in | Wt 243.0 lb

## 2020-12-28 DIAGNOSIS — Z9221 Personal history of antineoplastic chemotherapy: Secondary | ICD-10-CM | POA: Diagnosis not present

## 2020-12-28 DIAGNOSIS — Z8572 Personal history of non-Hodgkin lymphomas: Secondary | ICD-10-CM | POA: Insufficient documentation

## 2020-12-28 DIAGNOSIS — K2100 Gastro-esophageal reflux disease with esophagitis, without bleeding: Secondary | ICD-10-CM | POA: Diagnosis not present

## 2020-12-28 NOTE — Progress Notes (Signed)
[facility] OFFICE PROGRESS NOTE   Diagnosis: Non-Hodgkin's lymphoma  INTERVAL HISTORY:   Adam Alvarado was last seen in the oncology clinic in 2019.  He recently saw Dr. Fuller Plan to evaluate reflux symptoms.  He reports 3 episodes of emesis while eating followed by reflux symptoms.  The symptoms have resolved. He underwent an abdominal ultrasound on 09/09/2020.  This revealed a dilated common bile duct that was new since 2015.  An MRI of the abdomen on 09/23/2020 revealed mild diffuse hepatic steatosis with no evidence of a hepatic mass.  No evidence of biliary ductal dilatation or choledocholithiasis.  An ill-defined density in the central and left upper quadrant small bowel mesentery was suspicious for mesenteric panniculitis.  A follow-up CT abdomen/pelvis was recommended.  The CT on 12/16/2020 revealed no pancreas mass or inflammatory change.  Ill-defined soft tissue density is seen in the central small bowel mesentery as on the MRI.  The density has nearly completely resolved compared to a mass a PET CT in 2015 consistent with posttreatment change from non-Hodgkin's involvement.  No pathologic enlarged lymph nodes.   No fever, night sweats, or palpable lymph nodes.  He reports hair growth over the past several years.  He discontinued Protonix as he felt this caused joint pain.  Objective:  Vital signs in last 24 hours:  Blood pressure (!) 150/69, pulse 60, temperature 98.1 F (36.7 C), temperature source Oral, resp. rate 18, height _0  (1.778 m), weight 243 lb (110.2 kg), SpO2 100 %.    Lymphatics: No cervical, supraclavicular, axillary, or inguinal nodes Resp: Lungs clear bilaterally Cardio: Regular rate and rhythm GI: No mass, nontender, no hepatosplenomegaly Vascular: No leg edema   Lab Results:  Lab Results  Component Value Date   WBC 5.4 01/09/2014   HGB 15.1 01/09/2014   HCT 45.9 01/09/2014   MCV 89.5 01/09/2014   PLT 199 01/09/2014   NEUTROABS 3.7 01/09/2014     CMP  Lab Results  Component Value Date   NA 143 01/09/2014   K 4.3 01/09/2014   CL 108 (H) 01/09/2013   CO2 22 01/09/2014   GLUCOSE 95 01/09/2014   BUN 16 09/11/2020   CREATININE 1.50 (H) 12/16/2020   CALCIUM 9.1 01/09/2014   PROT 7.0 09/11/2020   ALBUMIN 4.3 09/11/2020   AST 16 09/11/2020   ALT 20 09/11/2020   ALKPHOS 63 09/11/2020   BILITOT 0.4 09/11/2020   GFRNONAA 81 (L) 12/19/2012   GFRAA >90 12/19/2012    Medications: I have reviewed the patient's current medications.   Assessment/Plan:  1. Non-Hodgkin's lymphoma-Large abdominal mass surrounding small lymph nodes status post CT-guided biopsy on 07/20/2012 with the pathology suggestive of non-Hodgkin's lymphoma. Status post laparoscopic biopsy of the left abdominal mass on 08/21/2011 with final pathology confirming a large B-cell lymphoma, CD20 positive, with both follicular and diffuse patterns. Negative staging bone marrow biopsy. Status post cycle 1 CHOP/Rituxan on 09/06/2012. He completed cycle 5 on 12/10/2012 with Neulasta support. He completed cycle 6 with Neulasta support on 01/09/2013 -Restaging PET scan on 45/10/8880-[id number] improved metabolic activity associated with a prevascular node and jejunal mesenteric nodal mass compared to the PET scan 09/05/2012, stable prevascular node and jejunal nodal mass compared to the CT from 11/20/2012 . Review of the 01/30/2013 PET scan in radiology was consistent with a complete hypermetabolic response  -Restaging PET scan 02/10/2014 improvement in size and metabolic activity associated with a residual prevascular lymph node and jejunal mesentery nodal mass  2. -CT abdomen/pelvis  12/16/2020-ill-defined soft tissue density in the central small bowel mesentery, nearly completely resolved since 2015Nausea/vomiting following cycle 1 CHOP/Rituxan. Antiemetic regimen was adjusted with cycle 2 to include Aloxi, Decadron and Emend.  3. Status post Port-A-Cath placement 08/30/2012. Status  post Port-A-Cath removal 11/26/2012. 4. Neutropenia following cycle 2 CHOP/Rituxan. Neulasta was added beginning with cycle 3. 5. Hiccups following treatment.  6. "Night sweats "-etiology unclear. Resolved after removal of the Port-A-Cath 7. Hospitalization 11/15/2012 through 11/27/2012 with fever. No source of infection identified. Port-A-Cath was removed on 11/26/2012. He completed a course of IV antibiotics 12/06/2012. 8. Admission with a syncope event on 12/17/2012, day 8 following cycle 5 CHOP-Rituxan 9. fever and neutropenia following cycle 5 CHOP-Rituxan, no source for infection was identified. A PICC was removed  10. Hypermetabolic activity in the distal esophagus on the PET scan 09/05/2012 and 01/30/2013-? Significant , upper endoscopy 02/26/2013 revealed esophagitis and no mass. He remains on antacid therapy. residual mild hypermetabolism associated with a hiatal hernia, improved on the PET scan 02/10/2014  11. Alopecia-Total body, partially improved 12. Gout 13.  Gastroesophageal reflux/Barrett's esophagus-followed by Dr. Fuller Plan     Disposition: Adam Menter remains in clinical remission from non-Hodgkin's lymphoma.  It is very likely the remaining small bowel mesenteric density is related to treated lymphoma.  There is no clinical evidence of progressive lymphoma.  He has a good prognosis for long-term disease-free survival.  He will call for new symptoms.  He is not scheduled for follow-up appointment in the oncology clinic.  I am available to see him as needed.  He will continue follow-up with Dr. Fuller Plan for management of reflux and Barrett's esophagus.  Adam Coder, MD  12/28/2020  3:24 PM

## 2020-12-29 ENCOUNTER — Telehealth: Payer: Self-pay

## 2020-12-29 DIAGNOSIS — N1831 Chronic kidney disease, stage 3a: Secondary | ICD-10-CM | POA: Diagnosis not present

## 2020-12-29 MED ORDER — ESOMEPRAZOLE MAGNESIUM 40 MG PO CPDR: 40.0000 mg | DELAYED_RELEASE_CAPSULE | Freq: Every day | ORAL | 3 refills | Status: AC

## 2020-12-29 NOTE — Telephone Encounter (Signed)
Patient's wife notified.  New rx sent for nexium,

## 2020-12-29 NOTE — Telephone Encounter (Signed)
-----   Message from Ladene Artist, MD sent at 12/28/2020  4:53 PM EDT ----- Regarding: FW: See string of messages. He needs to be on a PPI long term for his GERD, Barrett's. Try Nexium 40 mg or omeprazole 40 mg po qd instead. If he declines to take a PPI then famotidine 40 mg po bid. Thanks. ----- Message ----- From: Ladell Pier, MD Sent: 12/28/2020   4:45 PM EDT To: Ladene Artist, MD Subject: RE:                                            thanks ----- Message ----- From: Ladene Artist, MD Sent: 12/28/2020   4:44 PM EDT To: Ladell Pier, MD Subject: RE:                                            Adam Alvarado,  Thank you for seeing him and helping to put to rest the CT abnormality.   He needs to be on a PPI or at least a high dose H2RA for his Barrett's. I'll contact him and get him on an appropriate therapy.  Thank you,  Adam Alvarado ----- Message ----- From: Ladell Pier, MD Sent: 12/28/2020   4:20 PM EDT To: Ladene Artist, MD  I saw him today for the mesenteric density noted on the recent CT.  This is likely related to treated lymphoma.  I see no indication for further imaging.  He discontinued Protonix due to arthralgias and he felt it may have contributed to worsening of his renal function  Should he be on an acid therapy given the Barrett's noted on the recent endoscopy?  I will see him as needed  Thanks,  Leroy Sea

## 2021-01-20 DIAGNOSIS — M109 Gout, unspecified: Secondary | ICD-10-CM | POA: Diagnosis not present

## 2021-02-15 ENCOUNTER — Other Ambulatory Visit: Payer: Self-pay

## 2021-02-15 ENCOUNTER — Ambulatory Visit
Admission: EM | Admit: 2021-02-15 | Discharge: 2021-02-15 | Disposition: A | Discharge status: Home / Self Care | Payer: BC Managed Care – PPO | Attending: Family Medicine | Admitting: Family Medicine

## 2021-02-15 ENCOUNTER — Encounter: Payer: Self-pay | Admitting: Emergency Medicine

## 2021-02-15 DIAGNOSIS — M109 Gout, unspecified: Secondary | ICD-10-CM

## 2021-02-15 DIAGNOSIS — R55 Syncope and collapse: Secondary | ICD-10-CM

## 2021-02-15 MED ORDER — COLCHICINE 0.6 MG PO TABS: 0.6000 mg | ORAL_TABLET | Freq: Every day | ORAL | 0 refills | Status: DC

## 2021-02-15 MED ORDER — PREDNISONE 20 MG PO TABS: ORAL_TABLET | ORAL | 0 refills | Status: AC

## 2021-02-15 MED ORDER — COLCHICINE 0.6 MG PO TABS: ORAL_TABLET | ORAL | 0 refills | Status: AC

## 2021-02-15 NOTE — ED Provider Notes (Signed)
[facility]    CSN: [id number] Arrival date & time: 02/15/21  1323      History   Chief Complaint No chief complaint on file.   HPI Adam Alvarado is a 65 y.o. male.   HPI Patient presents today with right great toe pain, swelling, and redness however mentions that he had a syncopal episodes in which he passed out for undetermined (short) time. 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 Reports seeing white spots prior to passing out. He was driven back to the funeral home and reports he could not recall the ride. He reports feeling completely fine now with the exception of gout flare. He has a history of prior syncopal episode.  Past Medical History:  Diagnosis Date   Adenomatous colon polyp 05/2000   Barrett's esophagus    Cancer ([place]) 2013   Non-Hodgkins Lymphoma   H/O hiatal hernia    small seen on CT Scan   Hemorrhoid    Other malignant lymphomas, unspecified site, extranodal and solid organ sites 2013    Patient Active Problem List   Diagnosis Date Noted   Early satiety 08/24/2020   Bloating 08/24/2020   H/O non-Hodgkin's lymphoma 08/24/2020   Fatigue 08/24/2020   Neutropenia ([place]) 12/17/2012   Fever 11/21/2012   Anemia 11/21/2012   Nausea with vomiting 11/21/2012   Diarrhea 11/21/2012   Antineoplastic chemotherapy induced pancytopenia ([place]) 11/21/2012   Syncope 11/16/2012   Hyponatremia 11/16/2012   Non-Hodgkin lymphoma of intra-abdominal lymph nodes ([place]) 09/02/2012    Past Surgical History:  Procedure Laterality Date   BIOPSY  08/20/2012   Procedure: BIOPSY;  Surgeon: Adin Hector, MD;  Location: [facility];  Service: General;  Laterality: N/A;  Laparoscopic biopsy of abdominal mesenteric mass   LAPAROSCOPY  08/20/2012   Procedure: LAPAROSCOPY DIAGNOSTIC;  Surgeon: Adin Hector, MD;  Location: [facility];  Service: General;  Laterality: N/A;   [place]  Medications    Prior to Admission medications   Medication Sig Start Date End Date Taking? Authorizing Provider  aspirin 325 MG tablet Take 2 tablets (650 mg total) by mouth every 6 (six) hours as needed for pain or fever. Patient taking differently: Take 325 mg by mouth as needed for pain or fever. 11/27/12   Eugenie Filler, MD  esomeprazole (NEXIUM) 40 MG capsule Take 1 capsule (40 mg total) by mouth daily at 12 noon. 12/29/20   Ladene Artist, MD    Family History Family History  Problem Relation Age of Onset   Cancer Mother        Breast   Breast cancer Brother    Colon cancer Neg Hx    Esophageal cancer Neg Hx    Pancreatic cancer Neg Hx    Stomach cancer Neg Hx    Liver disease Neg Hx     Social History Social History   Tobacco Use   Smoking status: Never   Smokeless tobacco: Never  Vaping Use   Vaping Use: Never used  Substance Use Topics   Alcohol use: Yes    Comment: on occassion   Drug use: No     Allergies   Patient has no known allergies.   Review of Systems Review of Systems   Physical Exam Triage Vital Signs ED Triage Vitals [02/15/21 1349]  Enc Vitals Group     BP Marland Kitchen)  150/72     Pulse Rate 82     Resp 18     Temp 99.3 F (37.4 C)     Temp Source Oral     SpO2 95 %     Weight      Height      Head Circumference      Peak Flow      Pain Score 6     Pain Loc      Pain Edu?      Excl. in [place]?    No data found.  Updated Vital Signs BP (!) 150/72 (BP Location: Right Arm)   Pulse 82   Temp 99.3 F (37.4 C) (Oral)   Resp 18   SpO2 95%   Visual Acuity Right Eye Distance:   Left Eye Distance:   Bilateral Distance:    Right Eye Near:   Left Eye Near:    Bilateral Near:     Physical Exam General appearance: alert, well developed, well nourished, cooperative and in no distress Head: Normocephalic, without obvious abnormality, atraumatic Respiratory: Respirations even and unlabored, normal respiratory rate Heart: rate and  rhythm normal. No gallop or murmurs noted on exam  Abdomen: BS +, no distention, no rebound tenderness, or no mass Extremities: Right great toe erythematous, tender, swollen at proximal region  Skin: Skin color, texture, turgor normal. No rashes seen  Psych: Appropriate mood and affect. Neurologic: GCS 15, upper and lower body strength 5 out of 5, cerebellar function intact, no nystagmus gait normal  UC Treatments / Results  Labs (all labs ordered are listed, but only abnormal results are displayed) Labs Reviewed - No data to display  EKG   Radiology No results found.  Procedures Procedures (including critical care time)  Medications Ordered in UC Medications - No data to display  Initial Impression / Assessment and Plan / UC Course  I have reviewed the triage vital signs and the nursing notes.  Pertinent labs & imaging results that were available during my care of the patient were reviewed by me and considered in my medical decision making (see chart for details).    Patient was urged to go to the emergency department for further work-up and evaluation to rule out neurovascular causes as the source of his syncopal episode today.  Patient declines as he feels better now.  Risk of delaying evaluation was discussed with patient he is fully aware of possible repercussions of not being evaluated.  Treatment for gout per discharge instructions.  Patient advised to follow-up with primary care provider as needed. Final Clinical Impressions(s) / UC Diagnoses   Final diagnoses:  Syncope, unspecified syncope type  Acute gout involving toe of right foot, unspecified cause     Discharge Instructions      You did experience a syncopal episode within the setting of urgent care  unable to rule out that has been a source of impaired kidney functioning, stemming from an evolving heart problem or stroke.  I highly recommend that you go to your nearest emergency department for further  evaluation with lab work and any other diagnostic studies deemed necessary by the provider.  Syncope can be a precursor to a medical life-threatening emergency therefore should work to acutely to identify cause.     ED Prescriptions     Medication Sig Dispense Auth. Provider   colchicine 0.6 MG tablet  (Status: Discontinued) Take 1 tablet (0.6 mg total) by mouth daily. 60 tablet Scot Jun, FNP  predniSONE (DELTASONE) 20 MG tablet Take 3 tablets (60 mg total) by mouth daily with breakfast for 1 day, THEN 2 tablets (40 mg total) daily with breakfast for 4 days. 11 tablet Scot Jun, FNP   colchicine 0.6 MG tablet Take 1.2 mg at the first sign of a gout attack followed by 0.6 mg in 1 hour, 0.6 mg 8 hours if needed. Next day 1.2 mg twice daily as needed until flare resolves. 60 tablet Scot Jun, FNP      PDMP not reviewed this encounter.   Scot Jun, FNP 02/20/21 1807

## 2021-02-15 NOTE — Discharge Instructions (Addendum)
Follow-up with your primary care provider to have your kidney functioning rechecked as your level was 1.50 and in review of your labs it is trending upwardly therefore monitoring is warranted. I have refilled your gout medication if any symptoms related to dizziness or syncope redevelop go immediately to the emergency department.  You did experience a syncopal episode within the setting of urgent care  unable to rule out that has been a source of impaired kidney functioning, stemming from an evolving heart problem or stroke.  I highly recommend that you go to your nearest emergency department for further evaluation with lab work and any other diagnostic studies deemed necessary by the provider.  Syncope can be a precursor to a medical life-threatening emergency therefore should work to acutely to identify cause.

## 2021-02-15 NOTE — ED Triage Notes (Addendum)
Gout to RT foot, swelling, red and painful that started Sunday morning.  Pt also reports he had a syncopal episode at a funeral this morning.  States he got too hot and got inside in air conditioning and everything went white.  S/s subsided after a few minutes.

## 2021-03-10 DIAGNOSIS — M109 Gout, unspecified: Secondary | ICD-10-CM | POA: Diagnosis not present

## 2021-04-27 ENCOUNTER — Encounter: Payer: Self-pay | Admitting: Gastroenterology

## 2021-05-21 DIAGNOSIS — M1000 Idiopathic gout, unspecified site: Secondary | ICD-10-CM | POA: Diagnosis not present

## 2021-05-21 DIAGNOSIS — Z79899 Other long term (current) drug therapy: Secondary | ICD-10-CM | POA: Diagnosis not present

## 2021-05-31 DIAGNOSIS — N183 Chronic kidney disease, stage 3 unspecified: Secondary | ICD-10-CM | POA: Diagnosis not present

## 2021-05-31 DIAGNOSIS — M109 Gout, unspecified: Secondary | ICD-10-CM | POA: Diagnosis not present

## 2021-08-31 DIAGNOSIS — M109 Gout, unspecified: Secondary | ICD-10-CM | POA: Diagnosis not present

## 2021-09-06 DIAGNOSIS — M109 Gout, unspecified: Secondary | ICD-10-CM | POA: Diagnosis not present

## 2021-12-31 ENCOUNTER — Encounter: Payer: Self-pay | Admitting: Gastroenterology

## 2022-03-04 ENCOUNTER — Encounter: Payer: BC Managed Care – PPO | Admitting: Gastroenterology

## 2022-08-07 ENCOUNTER — Telehealth: Payer: Medicare Other | Admitting: Nurse Practitioner

## 2022-08-07 DIAGNOSIS — U071 COVID-19: Secondary | ICD-10-CM | POA: Diagnosis not present

## 2022-08-07 MED ORDER — MOLNUPIRAVIR EUA 200MG CAPSULE: 4.0000 | ORAL_CAPSULE | Freq: Two times a day (BID) | ORAL | 0 refills | Status: AC

## 2022-08-07 NOTE — Patient Instructions (Signed)
Adam Alvarado Files, thank you for joining Adam Pounds, NP for today's virtual visit.  While this provider is not your primary care provider (PCP), if your PCP is located in our provider database this encounter information will be shared with them immediately following your visit.   [facility] account gives you access to today's visit and all your visits, tests, and labs performed at [facility] " click here if you don't have a [name] account or go to [email or web address]  Consent: (Patient) Adam Alvarado provided verbal consent for this virtual visit at the beginning of the encounter.  Current Medications:  Current Outpatient Medications:    molnupiravir EUA (LAGEVRIO) 200 mg CAPS capsule, Take 4 capsules (800 mg total) by mouth 2 (two) times daily for 5 days., Disp: 40 capsule, Rfl: 0   aspirin 325 MG tablet, Take 2 tablets (650 mg total) by mouth every 6 (six) hours as needed for pain or fever. (Patient taking differently: Take 325 mg by mouth as needed for pain or fever.), Disp: , Rfl:    colchicine 0.6 MG tablet, Take 1.2 mg at the first sign of a gout attack followed by 0.6 mg in 1 hour, 0.6 mg 8 hours if needed. Next day 1.2 mg twice daily as needed until flare resolves., Disp: 60 tablet, Rfl: 0   esomeprazole (NEXIUM) 40 MG capsule, Take 1 capsule (40 mg total) by mouth daily at 12 noon., Disp: 90 capsule, Rfl: 3   Medications ordered in this encounter:  Meds ordered this encounter  Medications   molnupiravir EUA (LAGEVRIO) 200 mg CAPS capsule    Sig: Take 4 capsules (800 mg total) by mouth 2 (two) times daily for 5 days.    Dispense:  40 capsule    Refill:  0    Order Specific Question:   Supervising Provider    Answer:   Adam Alvarado [id number]     *If you need refills on other medications prior to your next appointment, please contact your pharmacy*  Follow-Up: Call back or seek an in-person evaluation if the symptoms worsen or  if the condition fails to improve as anticipated.  [place] [phone]  Other Instructions  Please keep well-hydrated and get plenty of rest. Start a saline nasal rinse to flush out your nasal passages. You can use plain Mucinex to help thin congestion. If you have a humidifier, you can use this daily as needed.    You are to wear a mask for 5 days from onset of your symptoms.  After day 5, if you have had no fever and you are feeling better with NO symptoms, you can end masking. Keep in mind you can be contagious 10 days from the onset of symptoms  After day 5 if you have a fever or are having significant symptoms, please wear your mask for full 10 days.   If you note any worsening of symptoms, any significant shortness of breath or any chest pain, please seek ER evaluation ASAP.  Please do not delay care!    If you note any worsening of symptoms, any significant shortness of breath or any chest pain, please seek ER evaluation ASAP.  Please do not delay care!    If you have been instructed to have an in-person evaluation today at a local Urgent Care facility, please use the link below. It will take you to a list of all of our available [facility], including  address, phone number and hours of operation. Please do not delay care.  [facility]  If you or a family member do not have a primary care provider, use the link below to schedule a visit and establish care. When you choose a [facility] primary care physician or advanced practice provider, you gain a long-term partner in health. Find a Primary Care Provider  Learn more about [facility]'s in-office and virtual care options: [facility]

## 2022-08-07 NOTE — Progress Notes (Signed)
Virtual Visit Consent   Adam Alvarado, you are scheduled for a virtual visit with a [facility] provider today. Just as with appointments in the office, your consent must be obtained to participate. Your consent will be active for this visit and any virtual visit you may have with one of our providers in the next 365 days. If you have a MyChart account, a copy of this consent can be sent to you electronically.  As this is a virtual visit, video technology does not allow for your provider to perform a traditional examination. This may limit your provider's ability to fully assess your condition. If your provider identifies any concerns that need to be evaluated in person or the need to arrange testing (such as labs, EKG, etc.), we will make arrangements to do so. Although advances in technology are sophisticated, we cannot ensure that it will always work on either your end or our end. If the connection with a video visit is poor, the visit may have to be switched to a telephone visit. With either a video or telephone visit, we are not always able to ensure that we have a secure connection.  By engaging in this virtual visit, you consent to the provision of healthcare and authorize for your insurance to be billed (if applicable) for the services provided during this visit. Depending on your insurance coverage, you may receive a charge related to this service.  I need to obtain your verbal consent now. Are you willing to proceed with your visit today? Adam Alvarado has provided verbal consent on 08/07/2022 for a virtual visit (video or telephone). Adam Pounds, NP  Date: 08/07/2022 10:07 AM  Virtual Visit via Video Note   I, Adam Alvarado, connected with  Adam Alvarado  ([id number], 1958/08/26) on 08/07/22 at 10:00 AM EST by a video-enabled telemedicine application and verified that I am speaking with the correct person using two identifiers.  Location: Patient: Virtual Visit Location  Patient: Home Provider: Virtual Visit Location Provider: Home Office   I discussed the limitations of evaluation and management by telemedicine and the availability of in person appointments. The patient expressed understanding and agreed to proceed.    History of Present Illness: Adam Alvarado is a 66 y.o. who identifies as a male who was assigned male at birth, and is being seen today for COVID positive.  Adam Alvarado tested positive for COVID last night. Symptoms onset 2 days ago and include: fatigue, nasal congestion, cough, sneezing, rhinorrhea, body aches.  He has been taking over-the-counter Mucinex for his symptoms.  Denies chest pain or shortness of breath.  Problems:  Patient Active Problem List   Diagnosis Date Noted   Early satiety 08/24/2020   Bloating 08/24/2020   H/O non-Hodgkin's lymphoma 08/24/2020   Fatigue 08/24/2020   Neutropenia ([place]) 12/17/2012   Fever 11/21/2012   Anemia 11/21/2012   Nausea with vomiting 11/21/2012   Diarrhea 11/21/2012   Antineoplastic chemotherapy induced pancytopenia ([place]) 11/21/2012   Syncope 11/16/2012   Hyponatremia 11/16/2012   Non-Hodgkin lymphoma of intra-abdominal lymph nodes ([place]) 09/02/2012    Allergies: No Known Allergies Medications:  Current Outpatient Medications:    molnupiravir EUA (LAGEVRIO) 200 mg CAPS capsule, Take 4 capsules (800 mg total) by mouth 2 (two) times daily for 5 days., Disp: 40 capsule, Rfl: 0   aspirin 325 MG tablet, Take 2 tablets (650 mg total) by mouth every 6 (six) hours as needed for pain or fever. (Patient taking differently:  Take 325 mg by mouth as needed for pain or fever.), Disp: , Rfl:    colchicine 0.6 MG tablet, Take 1.2 mg at the first sign of a gout attack followed by 0.6 mg in 1 hour, 0.6 mg 8 hours if needed. Next day 1.2 mg twice daily as needed until flare resolves., Disp: 60 tablet, Rfl: 0   esomeprazole (NEXIUM) 40 MG capsule, Take 1 capsule (40 mg total) by mouth daily at 12 noon., Disp: 90  capsule, Rfl: 3  Observations/Objective: Patient is well-developed, well-nourished in no acute distress.  Resting comfortably in bed at home.  Head is normocephalic, atraumatic.  No labored breathing.  Speech is clear and coherent with logical content.  Patient is alert and oriented at baseline.    Assessment and Plan: 1. Positive self-administered antigen test for COVID-19 - molnupiravir EUA (LAGEVRIO) 200 mg CAPS capsule; Take 4 capsules (800 mg total) by mouth 2 (two) times daily for 5 days.  Dispense: 40 capsule; Refill: 0   Please keep well-hydrated and get plenty of rest. Start a saline nasal rinse to flush out your nasal passages. You can use plain Mucinex to help thin congestion. If you have a humidifier, you can use this daily as needed.    You are to wear a mask for 5 days from onset of your symptoms.  After day 5, if you have had no fever and you are feeling better with NO symptoms, you can end masking. Keep in mind you can be contagious 10 days from the onset of symptoms  After day 5 if you have a fever or are having significant symptoms, please wear your mask for full 10 days.   If you note any worsening of symptoms, any significant shortness of breath or any chest pain, please seek ER evaluation ASAP.  Please do not delay care!    If you note any worsening of symptoms, any significant shortness of breath or any chest pain, please seek ER evaluation ASAP.  Please do not delay care!   Follow Up Instructions: I discussed the assessment and treatment plan with the patient. The patient was provided an opportunity to ask questions and all were answered. The patient agreed with the plan and demonstrated an understanding of the instructions.  A copy of instructions were sent to the patient via MyChart unless otherwise noted below.    The patient was advised to call back or seek an in-person evaluation if the symptoms worsen or if the condition fails to improve as  anticipated.  Time:  I spent 12 minutes with the patient via telehealth technology discussing the above problems/concerns.    Adam Pounds, NP

## 2022-09-05 ENCOUNTER — Encounter: Payer: Self-pay | Admitting: Gastroenterology

## 2022-09-13 ENCOUNTER — Ambulatory Visit (AMBULATORY_SURGERY_CENTER): Payer: Self-pay | Admitting: *Deleted

## 2022-09-13 VITALS — Ht 72.0 in | Wt 235.0 lb

## 2022-09-13 DIAGNOSIS — Z09 Encounter for follow-up examination after completed treatment for conditions other than malignant neoplasm: Secondary | ICD-10-CM

## 2022-09-13 DIAGNOSIS — Z86010 Personal history of colonic polyps: Secondary | ICD-10-CM

## 2022-09-13 MED ORDER — NA SULFATE-K SULFATE-MG SULF 17.5-3.13-1.6 GM/177ML PO SOLN: 1.0000 | Freq: Once | ORAL | 0 refills | Status: AC

## 2022-09-13 NOTE — Progress Notes (Signed)
No egg or soy allergy known to patient  No issues known to pt with past sedation with any surgeries or procedures Patient denies ever being intubated No issues with moving head or nbeck No issues with swallowing  No FH of Malignant Hyperthermia Pt is not on diet pills Pt is not on  home 02  Pt is not on blood thinners  Pt denies issues with constipation  Pt is not on dialysis Pt denies any upcoming cardiac testing Pt encouraged to use to use Singlecare or Goodrx to reduce cost  Patient's chart reviewed by Osvaldo Angst CNRA prior to previsit and patient appropriate for the [facility].  Previsit completed and red dot placed by patient's name on their procedure day (on provider's schedule).  . Visit by phone Instructions reviewed with pt and pt states understanding. Instructed to review again prior to procedure. Pt states they will.  Instructions sent by mail and my chart

## 2022-10-12 ENCOUNTER — Ambulatory Visit (AMBULATORY_SURGERY_CENTER): Payer: Medicare Other | Admitting: Gastroenterology

## 2022-10-12 ENCOUNTER — Encounter: Payer: Self-pay | Admitting: Gastroenterology

## 2022-10-12 VITALS — BP 102/60 | HR 55 | Temp 97.5°F | Resp 12 | Ht 72.0 in | Wt 235.0 lb

## 2022-10-12 DIAGNOSIS — D123 Benign neoplasm of transverse colon: Secondary | ICD-10-CM

## 2022-10-12 DIAGNOSIS — D120 Benign neoplasm of cecum: Secondary | ICD-10-CM

## 2022-10-12 DIAGNOSIS — Z09 Encounter for follow-up examination after completed treatment for conditions other than malignant neoplasm: Secondary | ICD-10-CM

## 2022-10-12 DIAGNOSIS — Z86010 Personal history of colonic polyps: Secondary | ICD-10-CM

## 2022-10-12 MED ORDER — SODIUM CHLORIDE 0.9 % IV SOLN: 500.0000 mL | Freq: Once | INTRAVENOUS | Status: DC

## 2022-10-12 NOTE — Progress Notes (Signed)
History & Physical  Primary Care Physician:  Asencion Noble, MD Primary Gastroenterologist: Lucio Edward, MD  Impression / Plan:  Personal history of adenomatous colon polyps for surveillance colonoscopy.  CHIEF COMPLAINT:  Personal history of colon polyps   HPI: Adam Alvarado is a 67 y.o. male with a personal history of adenomatous colon polyps for surveillance colonoscopy.   Past Medical History:  Diagnosis Date   Adenomatous colon polyp 05/2000   Barrett's esophagus    Cancer ([place]) 2013   Non-Hodgkins Lymphoma   H/O hiatal hernia    small seen on CT Scan   Hemorrhoid    Other malignant lymphomas, unspecified site, extranodal and solid organ sites 2013    Past Surgical History:  Procedure Laterality Date   BIOPSY  08/20/2012   Procedure: BIOPSY;  Surgeon: Adin Hector, MD;  Location: [facility];  Service: General;  Laterality: N/A;  Laparoscopic biopsy of abdominal mesenteric mass   COLONOSCOPY     LAPAROSCOPY  08/20/2012   Procedure: LAPAROSCOPY DIAGNOSTIC;  Surgeon: Adin Hector, MD;  Location: [facility];  Service: General;  Laterality: N/A;   [name]    Prior to Admission medications   Medication Sig Start Date End Date Taking? Authorizing Provider  allopurinol (ZYLOPRIM) 100 MG tablet Take 200 mg by mouth daily. 05/27/22  Yes [provider]  esomeprazole (NEXIUM) 40 MG capsule Take 1 capsule (40 mg total) by mouth daily at 12 noon. 12/29/20  Yes Ladene Artist, MD  colchicine 0.6 MG tablet Take 1.2 mg at the first sign of a gout attack followed by 0.6 mg in 1 hour, 0.6 mg 8 hours if needed. Next day 1.2 mg twice daily as needed until flare resolves. 02/15/21   Scot Jun, NP    Current Outpatient Medications  Medication Sig Dispense Refill   allopurinol (ZYLOPRIM) 100 MG tablet Take 200 mg by mouth daily.     esomeprazole (NEXIUM) 40 MG capsule Take 1 capsule (40 mg total) by mouth daily at 12 noon. 90 capsule 3   colchicine 0.6 MG tablet  Take 1.2 mg at the first sign of a gout attack followed by 0.6 mg in 1 hour, 0.6 mg 8 hours if needed. Next day 1.2 mg twice daily as needed until flare resolves. 60 tablet 0   Current Facility-Administered Medications  Medication Dose Route Frequency Provider Last Rate Last Admin   0.9 %  sodium chloride infusion  500 mL Intravenous Once Ladene Artist, MD        Allergies as of 10/12/2022   (No Known Allergies)    Family History  Problem Relation Age of Onset   Cancer Mother        Breast   Colon polyps Father    Breast cancer Brother    Colon cancer Neg Hx    Esophageal cancer Neg Hx    Pancreatic cancer Neg Hx    Stomach cancer Neg Hx    Liver disease Neg Hx     Social History   Socioeconomic History   Marital status: Married    Spouse name: Not on file   Number of children: Not on file   Years of education: Not on file   Highest education level: Not on file  Occupational History   Not on file  Tobacco Use   Smoking status: Never   Smokeless tobacco: Never  Vaping Use   Vaping Use: Never used  Substance and Sexual Activity  Alcohol use: Yes    Comment: on occassion   Drug use: No   Sexual activity: Never  Other Topics Concern   Not on file  Social History Narrative   Not on file   Social Determinants of Health   Financial Resource Strain: Not on file  Food Insecurity: Not on file  Transportation Needs: Not on file  Physical Activity: Not on file  Stress: Not on file  Social Connections: Not on file  Intimate Partner Violence: Not on file    Review of Systems:  All systems reviewed were negative except where noted in HPI.   Physical Exam: General:  Alert, well-developed, in NAD Head:  Normocephalic and atraumatic. Eyes:  Sclera clear, no icterus.   Conjunctiva pink. Ears:  Normal auditory acuity. Mouth:  No deformity or lesions.  Neck:  Supple; no masses. Lungs:  Clear throughout to auscultation.   No wheezes, crackles, or rhonchi.   Heart:  Regular rate and rhythm; no murmurs. Abdomen:  Soft, nondistended, nontender. No masses, hepatomegaly. No palpable masses.  Normal bowel sounds.    Rectal:  Deferred   Msk:  Symmetrical without gross deformities. Extremities:  Without edema. Neurologic:  Alert and  oriented x 4; grossly normal neurologically. Skin:  Intact without significant lesions or rashes. Psych:  Alert and cooperative. Normal mood and affect.   [name]. [place] Plan  10/12/2022, 8:18 AM See Shea Evans, [facility], to contact our on call provider

## 2022-10-12 NOTE — Patient Instructions (Signed)
Information on polyps and hemorrhoids given to you today.  Await pathology results.  Resume previous diet and medications.    YOU HAD AN ENDOSCOPIC PROCEDURE TODAY AT [facility]:   Refer to the procedure report that was given to you for any specific questions about what was found during the examination.  If the procedure report does not answer your questions, please call your gastroenterologist to clarify.  If you requested that your care partner not be given the details of your procedure findings, then the procedure report has been included in a sealed envelope for you to review at your convenience later.  YOU SHOULD EXPECT: Some feelings of bloating in the abdomen. Passage of more gas than usual.  Walking can help get rid of the air that was put into your GI tract during the procedure and reduce the bloating. If you had a lower endoscopy (such as a colonoscopy or flexible sigmoidoscopy) you may notice spotting of blood in your stool or on the toilet paper. If you underwent a bowel prep for your procedure, you may not have a normal bowel movement for a few days.  Please Note:  You might notice some irritation and congestion in your nose or some drainage.  This is from the oxygen used during your procedure.  There is no need for concern and it should clear up in a day or so.  SYMPTOMS TO REPORT IMMEDIATELY:  Following lower endoscopy (colonoscopy or flexible sigmoidoscopy):  Excessive amounts of blood in the stool  Significant tenderness or worsening of abdominal pains  Swelling of the abdomen that is new, acute  Fever of 100F or higher   For urgent or emergent issues, a gastroenterologist can be reached at any hour by calling [phone]. Do not use MyChart messaging for urgent concerns.    DIET:  We do recommend a small meal at first, but then you may proceed to your regular diet.  Drink plenty of fluids but you should avoid alcoholic beverages for 24  hours.  ACTIVITY:  You should plan to take it easy for the rest of today and you should NOT DRIVE or use heavy machinery until tomorrow (because of the sedation medicines used during the test).    FOLLOW UP: Our staff will call the number listed on your records the next business day following your procedure.  We will call around 7:15- 8:00 am to check on you and address any questions or concerns that you may have regarding the information given to you following your procedure. If we do not reach you, we will leave a message.     If any biopsies were taken you will be contacted by phone or by letter within the next 1-3 weeks.  Please call us at [phone] if you have not heard about the biopsies in 3 weeks.    SIGNATURES/CONFIDENTIALITY: You and/or your care partner have signed paperwork which will be entered into your electronic medical record.  These signatures attest to the fact that that the information above on your After Visit Summary has been reviewed and is understood.  Full responsibility of the confidentiality of this discharge information lies with you and/or your care-partner.

## 2022-10-12 NOTE — Progress Notes (Signed)
VS completed by DT.  Pt's states no medical or surgical changes since previsit or office visit.  

## 2022-10-12 NOTE — Progress Notes (Signed)
Report to PACU, RN, vss, BBS= Clear.  

## 2022-10-12 NOTE — Op Note (Signed)
[name] Procedure Date: 10/12/2022 8:18 AM MRN: [id number] Endoscopist: Ladene Artist , MD, [id number] Age: 67 Referring MD:  Date of Birth: [birth year] Gender: Male Account #: [id number] Procedure:                Colonoscopy Indications:              Surveillance: Personal history of adenomatous                            polyps on last colonoscopy > 5 years ago Medicines:                Monitored Anesthesia Care Procedure:                Pre-Anesthesia Assessment:                           - Prior to the procedure, a History and Physical                            was performed, and patient medications and                            allergies were reviewed. The patient's tolerance of                            previous anesthesia was also reviewed. The risks                            and benefits of the procedure and the sedation                            options and risks were discussed with the patient.                            All questions were answered, and informed consent                            was obtained. Prior Anticoagulants: The patient has                            taken no anticoagulant or antiplatelet agents. ASA                            Grade Assessment: II - A patient with mild systemic                            disease. After reviewing the risks and benefits,                            the patient was deemed in satisfactory condition to                            undergo the procedure.  After obtaining informed consent, the colonoscope                            was passed under direct vision. Throughout the                            procedure, the patient's blood pressure, pulse, and                            oxygen saturations were monitored continuously. The                            CF [id number] was introduced through the anus                            and advanced to the  the cecum, identified by                            appendiceal orifice and ileocecal valve. The                            ileocecal valve, appendiceal orifice, and rectum                            were photographed. The quality of the bowel                            preparation was good. The colonoscopy was performed                            without difficulty. The patient tolerated the                            procedure well. Scope In: 8:23:26 AM Scope Out: 8:39:12 AM Scope Withdrawal Time: 0 hours 10 minutes 23 seconds  Total Procedure Duration: 0 hours 15 minutes 46 seconds  Findings:                 The perianal and digital rectal examinations were                            normal.                           Two sessile polyps were found in the hepatic                            flexure and cecum. The polyps were 7 to 8 mm in                            size. These polyps were removed with a cold snare.                            Resection and retrieval were complete.  Internal hemorrhoids were found during                            retroflexion. The hemorrhoids were moderate and                            Grade I (internal hemorrhoids that do not prolapse).                           The exam was otherwise without abnormality on                            direct and retroflexion views. Complications:            No immediate complications. Estimated blood loss:                            None. Estimated Blood Loss:     Estimated blood loss: none. Impression:               - Two 7 to 8 mm polyps at the hepatic flexure and                            in the cecum, removed with a cold snare. Resected                            and retrieved.                           - Internal hemorrhoids.                           - The examination was otherwise normal on direct                            and retroflexion views. Recommendation:           - Repeat  colonoscopy after studies are complete for                            surveillance based on pathology results.                           - Patient has a contact number available for                            emergencies. The signs and symptoms of potential                            delayed complications were discussed with the                            patient. Return to normal activities tomorrow.                            Written discharge instructions were provided to the  patient.                           - Resume previous diet.                           - Continue present medications.                           - Await pathology results. Ladene Artist, MD 10/12/2022 8:43:46 AM This report has been signed electronically.

## 2022-10-12 NOTE — Progress Notes (Signed)
Called to room to assist during endoscopic procedure.  Patient ID and intended procedure confirmed with present staff. Received instructions for my participation in the procedure from the performing physician.  

## 2022-10-13 ENCOUNTER — Telehealth: Payer: Self-pay

## 2022-10-13 NOTE — Telephone Encounter (Signed)
  Follow up Call-     10/12/2022    7:33 AM 08/27/2020    2:03 PM  Call back number  Post procedure Call Back phone  # [phone]  Permission to leave phone message Yes Yes     Patient questions:  Do you have a fever, pain , or abdominal swelling? No. Pain Score  0 *  Have you tolerated food without any problems? Yes.    Have you been able to return to your normal activities? Yes.    Do you have any questions about your discharge instructions: Diet   No. Medications  No. Follow up visit  No.  Do you have questions or concerns about your Care? No.  Actions: * If pain score is 4 or above: No action needed, pain <4.

## 2022-10-18 ENCOUNTER — Encounter: Payer: Self-pay | Admitting: Gastroenterology

## 2023-12-09 ENCOUNTER — Ambulatory Visit (INDEPENDENT_AMBULATORY_CARE_PROVIDER_SITE_OTHER)

## 2023-12-09 ENCOUNTER — Ambulatory Visit: Admission: EM | Admit: 2023-12-09 | Discharge: 2023-12-09 | Disposition: A | Discharge status: Home / Self Care

## 2023-12-09 ENCOUNTER — Encounter: Payer: Self-pay | Admitting: Emergency Medicine

## 2023-12-09 DIAGNOSIS — J189 Pneumonia, unspecified organism: Secondary | ICD-10-CM | POA: Diagnosis not present

## 2023-12-09 DIAGNOSIS — R059 Cough, unspecified: Secondary | ICD-10-CM | POA: Diagnosis not present

## 2023-12-09 LAB — POC COVID19/FLU A&B COMBO
Covid Antigen, POC: NEGATIVE
Influenza A Antigen, POC: NEGATIVE
Influenza B Antigen, POC: NEGATIVE

## 2023-12-09 MED ORDER — ALBUTEROL SULFATE HFA 108 (90 BASE) MCG/ACT IN AERS: 2.0000 | INHALATION_SPRAY | Freq: Four times a day (QID) | RESPIRATORY_TRACT | 0 refills | Status: DC | PRN

## 2023-12-09 MED ORDER — ONDANSETRON 4 MG PO TBDP
4.0000 mg | ORAL_TABLET | Freq: Once | ORAL | Status: AC
  Administered 2023-12-09: 4 mg via ORAL

## 2023-12-09 MED ORDER — PREDNISONE 20 MG PO TABS: 40.0000 mg | ORAL_TABLET | Freq: Every day | ORAL | 0 refills | Status: AC

## 2023-12-09 MED ORDER — HYDROCODONE BIT-HOMATROP MBR 5-1.5 MG/5ML PO SOLN: 5.0000 mL | Freq: Four times a day (QID) | ORAL | 0 refills | Status: DC | PRN

## 2023-12-09 MED ORDER — ONDANSETRON 4 MG PO TBDP: 4.0000 mg | ORAL_TABLET | Freq: Three times a day (TID) | ORAL | 0 refills | Status: DC | PRN

## 2023-12-09 NOTE — ED Provider Notes (Addendum)
 [facility]    CSN: [id number] Arrival date & time: 12/09/23  4098      History   Chief Complaint No chief complaint on file.   HPI Adam Alvarado is a 68 y.o. male.   The history is provided by the patient.   Patient presents for complaints of fever, fatigue, cough, and nausea and vomiting.  Patient states symptoms started 2-3 3 to 4 days ago.  Tmax 102.5.  Patient states he took a home COVID/flu test when symptoms started, which was negative.  Patient states that he continues to feel fatigued, states cough is not improving, and continues to run a fever.  Fever this morning was between 100-101.  He denies headache, ear pain, wheezing, chest pain, abdominal pain, diarrhea, or constipation.  Patient did reach out to his PCP on yesterday, PCP prescribed levofloxacin and benzonatate for symptoms.  Patient with history of lymphoma.  Past Medical History:  Diagnosis Date   Adenomatous colon polyp 05/2000   Barrett's esophagus    Cancer (HCC) 2013   Non-Hodgkins Lymphoma   H/O hiatal hernia    small seen on CT Scan   Hemorrhoid    Other malignant lymphomas, unspecified site, extranodal and solid organ sites 2013    Patient Active Problem List   Diagnosis Date Noted   Early satiety 08/24/2020   Bloating 08/24/2020   H/O non-Hodgkin's lymphoma 08/24/2020   Fatigue 08/24/2020   Neutropenia (HCC) 12/17/2012   Fever 11/21/2012   Anemia 11/21/2012   Nausea with vomiting 11/21/2012   Diarrhea 11/21/2012   Antineoplastic chemotherapy induced pancytopenia (HCC) 11/21/2012   Syncope 11/16/2012   Hyponatremia 11/16/2012   Non-Hodgkin lymphoma of intra-abdominal lymph nodes (HCC) 09/02/2012    Past Surgical History:  Procedure Laterality Date   BIOPSY  08/20/2012   Procedure: BIOPSY;  Surgeon: Levert Ready, MD;  Location: [facility] OR;  Service: General;  Laterality: N/A;  Laparoscopic biopsy of abdominal mesenteric mass   COLONOSCOPY     LAPAROSCOPY  08/20/2012    Procedure: LAPAROSCOPY DIAGNOSTIC;  Surgeon: Levert Ready, MD;  Location: [facility] OR;  Service: General;  Laterality: N/A;   VASECTOMY  1998       Home Medications    Prior to Admission medications   Medication Sig Start Date End Date Taking? Authorizing Provider  benzonatate (TESSALON) 200 MG capsule Take 200 mg by mouth 3 (three) times daily as needed for cough.   Yes [provider]  levofloxacin (LEVAQUIN) 750 MG tablet Take 750 mg by mouth daily.   Yes [provider]  allopurinol  (ZYLOPRIM ) 100 MG tablet Take 300 mg by mouth daily. 05/27/22   [provider]  colchicine  0.6 MG tablet Take 1.2 mg at the first sign of a gout attack followed by 0.6 mg in 1 hour, 0.6 mg 8 hours if needed. Next day 1.2 mg twice daily as needed until flare resolves. 02/15/21   Buena Carmine, NP  esomeprazole  (NEXIUM ) 40 MG capsule Take 1 capsule (40 mg total) by mouth daily at 12 noon. 12/29/20   Asencion Blacksmith, MD    Family History Family History  Problem Relation Age of Onset   Cancer Mother        Breast   Colon polyps Father    Breast cancer Brother    Colon cancer Neg Hx    Esophageal cancer Neg Hx    Pancreatic cancer Neg Hx    Stomach cancer Neg Hx    Liver  disease Neg Hx     Social History Social History   Tobacco Use   Smoking status: Never   Smokeless tobacco: Never  Vaping Use   Vaping status: Never Used  Substance Use Topics   Alcohol use: Yes    Comment: on occassion   Drug use: No     Allergies   Patient has no known allergies.   Review of Systems Review of Systems Per HPI  Physical Exam Triage Vital Signs ED Triage Vitals  Encounter Vitals Group     BP 12/09/23 0857 (!) 147/79     Systolic BP Percentile --      Diastolic BP Percentile --      Pulse Rate 12/09/23 0857 82     Resp 12/09/23 0857 18     Temp 12/09/23 0857 99.7 F (37.6 C)     Temp Source 12/09/23 0857 Oral     SpO2 12/09/23 0857 91 %     Weight --       Height --      Head Circumference --      Peak Flow --      Pain Score 12/09/23 0859 0     Pain Loc --      Pain Education --      Exclude from Growth Chart --    No data found.  Updated Vital Signs BP (!) 147/79 (BP Location: Right Arm)   Pulse 82   Temp 99.7 F (37.6 C) (Oral)   Resp 18   SpO2 91%   Visual Acuity Right Eye Distance:   Left Eye Distance:   Bilateral Distance:    Right Eye Near:   Left Eye Near:    Bilateral Near:     Physical Exam Vitals and nursing note reviewed.  Constitutional:      General: He is not in acute distress.    Appearance: Normal appearance. He is ill-appearing.  HENT:     Head: Normocephalic.     Right Ear: Tympanic membrane, ear canal and external ear normal.     Left Ear: Tympanic membrane, ear canal and external ear normal.     Nose: Congestion present.     Mouth/Throat:     Mouth: Mucous membranes are moist.  Eyes:     Extraocular Movements: Extraocular movements intact.     Conjunctiva/sclera: Conjunctivae normal.     Pupils: Pupils are equal, round, and reactive to light.  Cardiovascular:     Rate and Rhythm: Normal rate and regular rhythm.     Pulses: Normal pulses.     Heart sounds: Normal heart sounds.  Pulmonary:     Effort: Pulmonary effort is normal. No respiratory distress.     Breath sounds: Normal breath sounds. No stridor. No wheezing, rhonchi or rales.  Abdominal:     General: Bowel sounds are normal.     Palpations: Abdomen is soft.     Tenderness: There is no abdominal tenderness.  Musculoskeletal:     Cervical back: Normal range of motion.  Skin:    General: Skin is warm and dry.  Neurological:     General: No focal deficit present.     Mental Status: He is alert and oriented to person, place, and time.  Psychiatric:        Mood and Affect: Mood normal.        Behavior: Behavior normal.      UC Treatments / Results  Labs (all labs ordered are listed, but only abnormal  results are  displayed) Labs Reviewed  POC COVID19/FLU A&B COMBO    EKG   Radiology DG Chest 2 View Result Date: 12/09/2023 CLINICAL DATA:  Cough and fever for 3 days EXAM: CHEST - 2 VIEW COMPARISON:  12/17/2012 FINDINGS: Midline trachea. Borderline cardiomegaly. Mediastinal contours otherwise within normal limits. No pleural effusion or pneumothorax. Suspect right infrahilar/central lower lobe mild airspace disease. Clear left lung. IMPRESSION: Subtle right infrahilar/central lower lobe airspace disease, suspicious for pneumonia. Followup PA and lateral chest X-ray is recommended in 3-4 weeks following trial of antibiotic therapy to ensure resolution and exclude underlying malignancy. Electronically Signed   By: Lore Rode M.D.   On: 12/09/2023 09:29    Procedures Procedures (including critical care time)  Medications Ordered in UC Medications  ondansetron  (ZOFRAN -ODT) disintegrating tablet 4 mg (4 mg Oral Given 12/09/23 4098)    Initial Impression / Assessment and Plan / UC Course  I have reviewed the triage vital signs and the nursing notes.  Pertinent labs & imaging results that were available during my care of the patient were reviewed by me and considered in my medical decision making (see chart for details).  Zofran  4 mg ODT administered as patient was actively vomiting in the clinic.  Chest x-ray shows a subtle right infrahilar/central lower lobe airspace disease, suspicious for pneumonia.  Will have patient continue Levaquin 750 mg daily.  Will have patient's start prednisone  40 mg for the next 5 days, and an albuterol inhaler as needed for wheezing or shortness of breath.  Ondansetron  4 mg ODT also prescribed for nausea and vomiting.  Hycodan cough syrup prescribed for cough if benzonatate is not helpful.  Supportive care recommendations were provided and discussed with the patient and his spouse to include continuing over-the-counter analgesics, increasing fluids, allowing for plenty of  rest, and to monitor for worsening symptoms.  Patient was advised that if symptoms do not improve over the next 24 hours, or if he begins to feel worse, recommend follow-up in the emergency department immediately for further evaluation.  Patient was advised that radiology is recommending repeat chest x-ray in 3 to 4 weeks to ensure resolution of pneumonia.  Patient and spouse were in agreement with this plan of care and verbalized understanding.  All questions were answered.  Patient stable for discharge.  Final Clinical Impressions(s) / UC Diagnoses   Final diagnoses:  Cough, unspecified type   Discharge Instructions   None    ED Prescriptions   None    PDMP not reviewed this encounter.   Hardy Lia, NP 12/09/23 0947    Hardy Lia, NP 12/09/23 [phone]

## 2023-12-09 NOTE — ED Triage Notes (Signed)
 Fever, cough, fatigue, since Tuesday.  States called his doctor and was given levofloxacin and benzonatate on 4/25.  States continues to feel bad.

## 2023-12-09 NOTE — Discharge Instructions (Signed)
 The chest x-ray shows that you do have pneumonia. Take medication as prescribed.  Continue the antibiotic prescribed by your primary care physician. Increase fluids and allow for plenty of rest.  Recommend Pedialyte or Gatorade to prevent dehydration. Continue aspirin  for fever, pain, or general discomfort. Recommend use of a humidifier in your bedroom at nighttime during sleep and to sleep elevated on pillows while cough symptoms persist. Recommend a brat diet to include bananas, rice, applesauce, and toast until nausea and vomiting improved. Monitor symptoms closely for worsening.  If you are not improving over the next 24 hours, please go to the emergency department immediately for further evaluation.  Also, if you begin to experience shortness of breath, difficulty breathing, or other concerns, please go to the emergency department immediately. You will need to follow-up with your primary care physician for a repeat chest x-ray in the next 3 to 4 weeks to ensure resolution of the pneumonia. Follow-up as needed.

## 2023-12-09 NOTE — ED Notes (Signed)
Patient vomited x1

## 2023-12-28 ENCOUNTER — Other Ambulatory Visit (HOSPITAL_COMMUNITY): Payer: Self-pay | Admitting: Internal Medicine

## 2023-12-28 ENCOUNTER — Encounter: Payer: Self-pay | Admitting: Internal Medicine

## 2023-12-28 DIAGNOSIS — J189 Pneumonia, unspecified organism: Secondary | ICD-10-CM

## 2024-01-02 ENCOUNTER — Ambulatory Visit (HOSPITAL_COMMUNITY)
Admission: RE | Admit: 2024-01-02 | Discharge: 2024-01-02 | Disposition: A | Discharge status: Home / Self Care | Source: Ambulatory Visit | Attending: Internal Medicine | Admitting: Internal Medicine

## 2024-01-02 DIAGNOSIS — J189 Pneumonia, unspecified organism: Secondary | ICD-10-CM | POA: Insufficient documentation

## 2024-03-27 ENCOUNTER — Emergency Department (HOSPITAL_COMMUNITY)

## 2024-03-27 ENCOUNTER — Observation Stay (HOSPITAL_COMMUNITY)
Admission: EM | Admit: 2024-03-27 | Discharge: 2024-03-29 | Disposition: A | Discharge status: Home / Self Care | Attending: Family Medicine | Admitting: Family Medicine

## 2024-03-27 ENCOUNTER — Other Ambulatory Visit: Payer: Self-pay

## 2024-03-27 ENCOUNTER — Encounter (HOSPITAL_COMMUNITY): Payer: Self-pay

## 2024-03-27 DIAGNOSIS — I959 Hypotension, unspecified: Secondary | ICD-10-CM

## 2024-03-27 DIAGNOSIS — Z8572 Personal history of non-Hodgkin lymphomas: Secondary | ICD-10-CM | POA: Insufficient documentation

## 2024-03-27 DIAGNOSIS — R2689 Other abnormalities of gait and mobility: Secondary | ICD-10-CM | POA: Insufficient documentation

## 2024-03-27 DIAGNOSIS — G459 Transient cerebral ischemic attack, unspecified: Secondary | ICD-10-CM | POA: Diagnosis not present

## 2024-03-27 DIAGNOSIS — Z7901 Long term (current) use of anticoagulants: Secondary | ICD-10-CM | POA: Insufficient documentation

## 2024-03-27 DIAGNOSIS — K219 Gastro-esophageal reflux disease without esophagitis: Secondary | ICD-10-CM | POA: Insufficient documentation

## 2024-03-27 DIAGNOSIS — Z79899 Other long term (current) drug therapy: Secondary | ICD-10-CM | POA: Diagnosis not present

## 2024-03-27 DIAGNOSIS — I639 Cerebral infarction, unspecified: Secondary | ICD-10-CM | POA: Diagnosis present

## 2024-03-27 DIAGNOSIS — N1831 Chronic kidney disease, stage 3a: Secondary | ICD-10-CM | POA: Diagnosis not present

## 2024-03-27 DIAGNOSIS — R001 Bradycardia, unspecified: Secondary | ICD-10-CM | POA: Insufficient documentation

## 2024-03-27 DIAGNOSIS — F1090 Alcohol use, unspecified, uncomplicated: Secondary | ICD-10-CM | POA: Diagnosis not present

## 2024-03-27 LAB — TROPONIN I (HIGH SENSITIVITY)
Troponin I (High Sensitivity): 9 ng/L (ref <18)
Troponin I (High Sensitivity): 8 ng/L (ref <18)

## 2024-03-27 LAB — DIFFERENTIAL
Abs Immature Granulocytes: 0.01 K/uL (ref 0.00–0.07)
Basophils Absolute: 0 K/uL (ref 0.0–0.1)
Basophils Relative: 0 %
Eosinophils Absolute: 0.1 K/uL (ref 0.0–0.5)
Eosinophils Relative: 2 %
Immature Granulocytes: 0 %
Lymphocytes Relative: 28 %
Lymphs Abs: 1.5 K/uL (ref 0.7–4.0)
Monocytes Absolute: 0.5 K/uL (ref 0.1–1.0)
Monocytes Relative: 9 %
Neutro Abs: 3.3 K/uL (ref 1.7–7.7)
Neutrophils Relative %: 61 %

## 2024-03-27 LAB — CBC
HCT: 42.4 % (ref 39.0–52.0)
Hemoglobin: 14 g/dL (ref 13.0–17.0)
MCH: 30.4 pg (ref 26.0–34.0)
MCHC: 33 g/dL (ref 30.0–36.0)
MCV: 92 fL (ref 80.0–100.0)
Platelets: 171 K/uL (ref 150–400)
RBC: 4.61 MIL/uL (ref 4.22–5.81)
RDW: 13.8 % (ref 11.5–15.5)
WBC: 5.5 K/uL (ref 4.0–10.5)
nRBC: 0 % (ref 0.0–0.2)

## 2024-03-27 LAB — I-STAT CHEM 8, ED
BUN: 17 mg/dL (ref 8–23)
Calcium, Ion: 1.02 mmol/L — ABNORMAL LOW (ref 1.15–1.40)
Chloride: 108 mmol/L (ref 98–111)
Creatinine, Ser: 1.3 mg/dL — ABNORMAL HIGH (ref 0.61–1.24)
Glucose, Bld: 128 mg/dL — ABNORMAL HIGH (ref 70–99)
HCT: 42 % (ref 39.0–52.0)
Hemoglobin: 14.3 g/dL (ref 13.0–17.0)
Potassium: 4.3 mmol/L (ref 3.5–5.1)
Sodium: 142 mmol/L (ref 135–145)
TCO2: 23 mmol/L (ref 22–32)

## 2024-03-27 LAB — APTT: aPTT: 25 s (ref 24–36)

## 2024-03-27 LAB — COMPREHENSIVE METABOLIC PANEL WITH GFR
ALT: 20 U/L (ref 0–44)
AST: 26 U/L (ref 15–41)
Albumin: 3.5 g/dL (ref 3.5–5.0)
Alkaline Phosphatase: 66 U/L (ref 38–126)
Anion gap: 10 (ref 5–15)
BUN: 14 mg/dL (ref 8–23)
CO2: 23 mmol/L (ref 22–32)
Calcium: 8.3 mg/dL — ABNORMAL LOW (ref 8.9–10.3)
Chloride: 109 mmol/L (ref 98–111)
Creatinine, Ser: 1.43 mg/dL — ABNORMAL HIGH (ref 0.61–1.24)
GFR, Estimated: 54 mL/min — ABNORMAL LOW (ref >60)
Glucose, Bld: 134 mg/dL — ABNORMAL HIGH (ref 70–99)
Potassium: 4.4 mmol/L (ref 3.5–5.1)
Sodium: 142 mmol/L (ref 135–145)
Total Bilirubin: 1.5 mg/dL — ABNORMAL HIGH (ref 0.0–1.2)
Total Protein: 6 g/dL — ABNORMAL LOW (ref 6.5–8.1)

## 2024-03-27 LAB — PROTIME-INR
INR: 1.1 (ref 0.8–1.2)
Prothrombin Time: 15 s (ref 11.4–15.2)

## 2024-03-27 LAB — MAGNESIUM: Magnesium: 1.7 mg/dL (ref 1.7–2.4)

## 2024-03-27 LAB — HEMOGLOBIN A1C
Hgb A1c MFr Bld: 5.7 % — ABNORMAL HIGH (ref 4.8–5.6)
Mean Plasma Glucose: 117 mg/dL

## 2024-03-27 LAB — CBG MONITORING, ED: Glucose-Capillary: 125 mg/dL — ABNORMAL HIGH (ref 70–99)

## 2024-03-27 LAB — ETHANOL: Alcohol, Ethyl (B): <15 mg/dL (ref <15)

## 2024-03-27 MED ORDER — IOHEXOL 350 MG/ML SOLN
75.0000 mL | Freq: Once | INTRAVENOUS | Status: AC | PRN
  Administered 2024-03-27 (×2): 75 mL via INTRAVENOUS

## 2024-03-27 MED ORDER — PANTOPRAZOLE SODIUM 40 MG PO TBEC
40.0000 mg | DELAYED_RELEASE_TABLET | Freq: Every day | ORAL | Status: DC
  Administered 2024-03-27 – 2024-03-29 (×4): 40 mg via ORAL
  Filled 2024-03-27 (×3): qty 1

## 2024-03-27 MED ORDER — ALLOPURINOL 300 MG PO TABS
300.0000 mg | ORAL_TABLET | Freq: Every day | ORAL | Status: DC
  Administered 2024-03-27 – 2024-03-29 (×4): 300 mg via ORAL
  Filled 2024-03-27 (×3): qty 1

## 2024-03-27 MED ORDER — ENOXAPARIN SODIUM 60 MG/0.6ML IJ SOSY
50.0000 mg | PREFILLED_SYRINGE | INTRAMUSCULAR | Status: DC
  Administered 2024-03-27 – 2024-03-28 (×3): 50 mg via SUBCUTANEOUS
  Filled 2024-03-27 (×2): qty 0.6

## 2024-03-27 MED ORDER — SENNOSIDES-DOCUSATE SODIUM 8.6-50 MG PO TABS: 1.0000 | ORAL_TABLET | Freq: Every evening | ORAL | Status: DC | PRN

## 2024-03-27 MED ORDER — ACETAMINOPHEN 325 MG PO TABS
650.0000 mg | ORAL_TABLET | ORAL | Status: DC | PRN
Start: 2024-03-27 — End: 2024-03-29

## 2024-03-27 MED ORDER — ACETAMINOPHEN 160 MG/5ML PO SOLN: 650.0000 mg | ORAL | Status: DC | PRN

## 2024-03-27 MED ORDER — CLOPIDOGREL BISULFATE 75 MG PO TABS
300.0000 mg | ORAL_TABLET | Freq: Once | ORAL | Status: AC
  Administered 2024-03-27 (×2): 300 mg via ORAL
  Filled 2024-03-27: qty 4

## 2024-03-27 MED ORDER — ASPIRIN 325 MG PO TABS
650.0000 mg | ORAL_TABLET | Freq: Once | ORAL | Status: AC
  Administered 2024-03-27 (×2): 650 mg via ORAL
  Filled 2024-03-27: qty 2

## 2024-03-27 MED ORDER — STROKE: EARLY STAGES OF RECOVERY BOOK
Freq: Once | Status: AC
  Filled 2024-03-27: qty 1

## 2024-03-27 MED ORDER — SODIUM CHLORIDE 0.9 % IV SOLN: INTRAVENOUS | Status: AC

## 2024-03-27 MED ORDER — ENOXAPARIN SODIUM 40 MG/0.4ML IJ SOSY: 40.0000 mg | PREFILLED_SYRINGE | INTRAMUSCULAR | Status: DC

## 2024-03-27 MED ORDER — CLOPIDOGREL BISULFATE 75 MG PO TABS
75.0000 mg | ORAL_TABLET | Freq: Every day | ORAL | Status: DC
  Administered 2024-03-28 – 2024-03-29 (×2): 75 mg via ORAL
  Filled 2024-03-27 (×2): qty 1

## 2024-03-27 MED ORDER — ACETAMINOPHEN 650 MG RE SUPP: 650.0000 mg | RECTAL | Status: DC | PRN

## 2024-03-27 MED ORDER — ASPIRIN 81 MG PO TBEC
81.0000 mg | DELAYED_RELEASE_TABLET | Freq: Every day | ORAL | Status: DC
  Administered 2024-03-28 – 2024-03-29 (×2): 81 mg via ORAL
  Filled 2024-03-27 (×2): qty 1

## 2024-03-27 NOTE — Care Management Obs Status (Signed)
 MEDICARE OBSERVATION STATUS NOTIFICATION   Patient Details  Name: Adam Alvarado MRN: [id number] Date of Birth: 29-Jul-1956   Medicare Observation Status Notification Given:  Yes  Obs letter signed and copy given  Claretta Deed 03/27/2024, 3:05 PM

## 2024-03-27 NOTE — Evaluation (Signed)
 Physical Therapy Evaluation Patient Details Name: Adam Alvarado MRN: [id number] DOB: Jan 11, 1956 Today's Date: 03/27/2024  History of Present Illness  68 y.o. male presents to [facility] 03/27/24 with L sided weakness/numbness, also found to have bradycardia ruled as code stroke. MRI and CT negative for acute abnormalities. PMHx: gout, syncope, non-Hodgkin's lymphoma  Clinical Impression  PTA, pt was independent for mobility with no AD and working full-time. Pt presents at functional mobility baseline with ability to ambulate 424ft ModI and ascend/descend 13 steps with one handrail and ModI. Pt scored a 20/24 on the DGI indicating that he is not at an increased risk of falling. Educated pt on warning signs of a stroke with cues needed to remember BE FAST acronym. At this time, pt does not have any acute or post-acute PT needs. Acute PT signing off. Please re-consult if new needs arise.        If plan is discharge home, recommend the following: Assist for transportation;Help with stairs or ramp for entrance   Can travel by private vehicle    Yes    Equipment Recommendations None recommended by PT     Functional Status Assessment Patient has not had a recent decline in their functional status     Precautions / Restrictions Precautions Precautions: Fall Recall of Precautions/Restrictions: Intact Restrictions Weight Bearing Restrictions Per Provider Order: No      Mobility  Bed Mobility Overal bed mobility: Independent Bed Mobility: Sit to Supine     Sit to supine: Independent     Transfers Overall transfer level: Needs assistance Equipment used: None Transfers: Sit to/from Stand Sit to Stand: Modified independent (Device/Increase time)   Ambulation/Gait Ambulation/Gait assistance: Modified independent (Device/Increase time) Gait Distance (Feet): 400 Feet Assistive device: None Gait Pattern/deviations: WFL(Within Functional Limits) Gait velocity: slightly decreased      Stairs Stairs: Yes Stairs assistance: Modified independent (Device/Increase time) Stair Management: One rail Right, Forwards, Step to pattern, Alternating pattern Number of Stairs: 13 General stair comments: step through pattern to ascend with pt alternating between step-to and step-through pattern to descend. Reported pain in L knee with cues to step up with good leg and down with bad leg  Modified Rankin (Stroke Patients Only) Modified Rankin (Stroke Patients Only) Pre-Morbid Rankin Score: No symptoms Modified Rankin: No symptoms     Balance Overall balance assessment: Modified Independent    Standardized Balance Assessment Standardized Balance Assessment : Dynamic Gait Index   Dynamic Gait Index Level Surface: Normal Change in Gait Speed: Normal Gait with Horizontal Head Turns: Normal Gait with Vertical Head Turns: Normal Gait and Pivot Turn: Normal Step Over Obstacle: Moderate Impairment Step Around Obstacles: Normal Steps: Moderate Impairment Total Score: 20       Pertinent Vitals/Pain Pain Assessment Pain Assessment: Faces Faces Pain Scale: Hurts little more Pain Location: L knee Pain Descriptors / Indicators: Discomfort Pain Intervention(s): Limited activity within patient's tolerance, Monitored during session, Repositioned    Home Living Family/patient expects to be discharged to:: Private residence Living Arrangements: Spouse/significant other Available Help at Discharge: Family;Available 24 hours/day Type of Home: House Home Access: Stairs to enter   Entrance Stairs-Number of Steps: 1 Alternate Level Stairs-Number of Steps: 13 Home Layout: Two level;Able to live on main level with bedroom/bathroom   Additional Comments: Has access to DME per pt report. 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    Prior Function Prior Level of Function : Independent/Modified Independent;Driving;Working/employed    Mobility Comments: ind ADLs Comments: ind      Extremity/Trunk  Assessment   Upper Extremity Assessment Upper Extremity Assessment: Overall WFL for tasks assessed (strength, coordination, and sensation WFL)    Lower Extremity Assessment Lower Extremity Assessment: Overall WFL for tasks assessed (strength, coordination, and sensation WFL)    Cervical / Trunk Assessment Cervical / Trunk Assessment: Normal  Communication   Communication Communication: No apparent difficulties    Cognition Arousal: Alert Behavior During Therapy: WFL for tasks assessed/performed   PT - Cognitive impairments: No apparent impairments    Following commands: Intact       Cueing Cueing Techniques: Verbal cues     General Comments General comments (skin integrity, edema, etc.): Daughter and son in room and supportive throughout session. Educated on BE FAST with pt able to recall afterwards with cueing and hints     PT Assessment Patient does not need any further PT services         PT Goals (Current goals can be found in the Care Plan section)  Acute Rehab PT Goals PT Goal Formulation: All assessment and education complete, DC therapy     AM-PAC PT 6 Clicks Mobility  Outcome Measure Help needed turning from your back to your side while in a flat bed without using bedrails?: None Help needed moving from lying on your back to sitting on the side of a flat bed without using bedrails?: None Help needed moving to and from a bed to a chair (including a wheelchair)?: None Help needed standing up from a chair using your arms (e.g., wheelchair or bedside chair)?: None Help needed to walk in hospital room?: None Help needed climbing 3-5 steps with a railing? : None 6 Click Score: 24    End of Session   Activity Tolerance: Patient tolerated treatment well Patient left: in bed;with call bell/phone within reach;with family/visitor present Nurse Communication: Mobility status PT Visit Diagnosis: Other abnormalities of gait and mobility  (R26.89)    Time: 1434-1450 PT Time Calculation (min) (ACUTE ONLY): 16 min   Charges:   PT Evaluation $PT Eval Low Complexity: 1 Low   PT General Charges $$ ACUTE PT VISIT: 1 Visit       Kate ORN, PT, DPT Secure Chat Preferred  Rehab Office [phone]   Kate BRAVO Wendolyn 03/27/2024, 4:36 PM

## 2024-03-27 NOTE — Consult Note (Signed)
 NEUROLOGY CONSULT NOTE   Date of service: March 27, 2024 Patient Name: Adam Alvarado MRN:  [id number] DOB:  1955/10/29 Chief Complaint: Acute onset of left sided numbness and weakness with diaphoresis and lightheadedness Requesting Provider: Dreama Longs, MD  History of Present Illness  Adam Alvarado is a 67 y.o. male with a PMHx of gout and non hodgkin's lymphoma presenting with acute onset of left sided numbness, weakness, and diaphoresis. He was on the phone at home around 0730 when suddenly felt weak on his left side, dropped the phone and slid off the bed. His wife states he was grey/pale and diaphoretic. The patient felt lightheaded. On EMS arrival HR 25, atropine x1 administered, bp 92/52 and HR then improved to 40s and then 60s. He received about 600cc of fluid en route with improvement in his weakness/numbness and diaphoresis. On arrival to the ED symptoms were largely resolved with mild residual left hand weakness and mild left esotropia noted.   On further conversation he states that he does have a remote history of syncope with exertion but that he has not had recent episodes (had an episode after running in the distant past and had an episode while on chemo in 2014).   LKW: 0730 Modified rankin score: 0 IV Thrombolysis:  No: Deficits resolved EVT: No, no LVO    NIHSS components Score: Comment  1a Level of Conscious 0[x]  1[]  2[]  3[]      1b LOC Questions 0[x]  1[]  2[]       1c LOC Commands 0[x]  1[]  2[]       2 Best Gaze 0[x]  1[]  2[]       3 Visual 0[x]  1[]  2[]  3[]      4 Facial Palsy 0[x]  1[]  2[]  3[]      5a Motor Arm - left 0[x]  1[]  2[]  3[]  4[]  UN[]    5b Motor Arm - Right 0[x]  1[]  2[]  3[]  4[]  UN[]    6a Motor Leg - Left 0[x]  1[]  2[]  3[]  4[]  UN[]    6b Motor Leg - Right 0[x]  1[]  2[]  3[]  4[]  UN[]    7 Limb Ataxia 0[x]  1[]  2[]  UN[]      8 Sensory 0[x]  1[]  2[]  UN[]      9 Best Language 0[x]  1[]  2[]  3[]      10 Dysarthria 0[x]  1[]  2[]  UN[]      11 Extinct. and Inattention 0[x]  1[]   2[]       TOTAL:  0       ROS  Comprehensive ROS performed and pertinent positives documented in HPI   Past History   Past Medical History:  Diagnosis Date   Adenomatous colon polyp 05/2000   Barrett's esophagus    Cancer (HCC) 2013   Non-Hodgkins Lymphoma   H/O hiatal hernia    small seen on CT Scan   Hemorrhoid    Other malignant lymphomas, unspecified site, extranodal and solid organ sites 2013    Past Surgical History:  Procedure Laterality Date   BIOPSY  08/20/2012   Procedure: BIOPSY;  Surgeon: Elon CHRISTELLA Pacini, MD;  Location: [facility] OR;  Service: General;  Laterality: N/A;  Laparoscopic biopsy of abdominal mesenteric mass   COLONOSCOPY     LAPAROSCOPY  08/20/2012   Procedure: LAPAROSCOPY DIAGNOSTIC;  Surgeon: Elon CHRISTELLA Pacini, MD;  Location: [facility] OR;  Service: General;  Laterality: N/A;   VASECTOMY  1998    Family History: Family History  Problem Relation Age of Onset   Cancer Mother        Breast   Colon polyps Father  Breast cancer Brother    Colon cancer Neg Hx    Esophageal cancer Neg Hx    Pancreatic cancer Neg Hx    Stomach cancer Neg Hx    Liver disease Neg Hx     Social History  reports that he has never smoked. He has never been exposed to tobacco smoke. He has never used smokeless tobacco. He reports current alcohol use. He reports that he does not use drugs.  No Known Allergies  Medications   Current Facility-Administered Medications:    0.9 %  sodium chloride  infusion, 500 mL, Intravenous, Once, Aneita Gwendlyn DASEN, MD  Current Outpatient Medications:    albuterol  (VENTOLIN  HFA) 108 (90 Base) MCG/ACT inhaler, Inhale 2 puffs into the lungs every 6 (six) hours as needed., Disp: 8 g, Rfl: 0   allopurinol  (ZYLOPRIM ) 100 MG tablet, Take 300 mg by mouth daily., Disp: , Rfl:    benzonatate (TESSALON) 200 MG capsule, Take 200 mg by mouth 3 (three) times daily as needed for cough., Disp: , Rfl:    colchicine  0.6 MG tablet, Take 1.2 mg at the first sign of  a gout attack followed by 0.6 mg in 1 hour, 0.6 mg 8 hours if needed. Next day 1.2 mg twice daily as needed until flare resolves., Disp: 60 tablet, Rfl: 0   esomeprazole  (NEXIUM ) 40 MG capsule, Take 1 capsule (40 mg total) by mouth daily at 12 noon., Disp: 90 capsule, Rfl: 3   HYDROcodone  bit-homatropine (HYCODAN) 5-1.5 MG/5ML syrup, Take 5 mLs by mouth every 6 (six) hours as needed for cough., Disp: 120 mL, Rfl: 0   levofloxacin (LEVAQUIN) 750 MG tablet, Take 750 mg by mouth daily., Disp: , Rfl:    ondansetron  (ZOFRAN -ODT) 4 MG disintegrating tablet, Take 1 tablet (4 mg total) by mouth every 8 (eight) hours as needed., Disp: 20 tablet, Rfl: 0  Vitals   Vitals:   03/27/24 0847 03/27/24 0910 03/27/24 0913 03/27/24 0915  BP: (!) 130/59 136/60  136/64  Pulse: (!) 58 63  81  Resp:  17  15  Temp:      TempSrc:      SpO2: 99% 100% 97% 100%  Weight:        Body mass index is 31.39 kg/m.   Physical Exam   Constitutional: Appears well-developed and well-nourished.  Psych: Affect appropriate to situation.  Eyes: No scleral injection.  HENT: No OP obstruction.  Head: Normocephalic.  Cardiovascular: Normal rate and regular rhythm.  Respiratory: Effort normal, non-labored breathing.  GI: Soft.  No distension. There is no tenderness.  Skin: WDI.   Neurologic Examination   Mental Status: Patient is awake, alert, oriented to person, place, month, year, and situation. Patient is able to give a clear and coherent history. No signs of aphasia or neglect Cranial Nerves: II: Visual Fields are full. Pupils are equal, round, and reactive to light.   III,IV, VI: Subtle left esotropia, not associated with any symptoms of double vision V: Facial sensation is symmetric to temperature VII: Facial movement is symmetric resting and smiling VIII: Hearing is intact to voice X: Palate elevates symmetrically XI: Shoulder shrug is symmetric. XII: Tongue protrudes midline without atrophy or  fasciculations.  Motor: Tone is normal. Bulk is normal. 5/5 strength was present in all four extremities.  Sensory: Sensation is symmetric to light touch and temperature in the arms and legs. No extinction to DSS present.  Deep Tendon Reflexes: 2+ and symmetric in the biceps and patellae.  Plantars: Toes  are downgoing bilaterally.  Cerebellar: FNF and HKS are intact bilaterally. LUE slightly slower Gait: Deferred   Labs/Imaging/Neurodiagnostic studies   CBC:  Recent Labs  Lab 04/17/24 0842 2024-04-17 0845  WBC 5.5  --   NEUTROABS 3.3  --   HGB 14.0 14.3  HCT 42.4 42.0  MCV 92.0  --   PLT 171  --    Basic Metabolic Panel:  Lab Results  Component Value Date   NA 142 17-Apr-2024   K 4.3 Apr 17, 2024   CO2 23 04/17/2024   GLUCOSE 128 (H) Apr 17, 2024   BUN 17 17-Apr-2024   CREATININE 1.30 (H) 04/17/2024   CALCIUM 8.3 (L) April 17, 2024   GFRNONAA 54 (L) Apr 17, 2024   GFRAA >90 12/19/2012   Lipid Panel: No results found for: LDLCALC HgbA1c: No results found for: HGBA1C Urine Drug Screen: No results found for: LABOPIA, COCAINSCRNUR, LABBENZ, AMPHETMU, THCU, LABBARB  Alcohol Level     Component Value Date/Time   [facility] <15 April 17, 2024 0842   INR  Lab Results  Component Value Date   INR 1.1 Apr 17, 2024   APTT  Lab Results  Component Value Date   APTT 25 2024-04-17     ASSESSMENT  Raydan Schlabach Rodden is a 68 y.o. male with a PMHx of non hodgkin's lymphoma and syncope presenting with acute onset of left sided numbness, weakness, and diaphoresis.  On EMS arrival his heart rate was 25 and he was given atropine.  Post atropine his blood pressure and heart rate improved and his symptoms subsided.  MRI here is negative for acute infarct.  - Exam is nonfocal except for subtle findings documented above. NIHSS 0.  - Labs: Glucose 128, Na and K normal. Ionized Ca low at 1.02. Cr elevated at 1.3. WBC normal. Coags normal.  - CT head: No acute intracranial abnormality.  - CTA of  head and neck: No large vessel occlusion. Focal moderate narrowing of the P2 segment of the right PCA. Mild atherosclerosis. - MRI brain: No acute intracranial abnormality.  - EKG: Sinus rhythm with HR of 60 BPM. Nonspecific intraventricular conduction delay. - Impression: Suspect an unusual presentation of a right MCA territory TIA that triggered the hypotension and bradycardia, rather than the other way around, since there is no critical stenosis in his anterior circulation and patient stating that the diaphoresis and lightheadedness occurred shortly after onset of the left sided weakness.    RECOMMENDATIONS  - HgbA1c, fasting lipid panel - Frequent neuro checks - Echocardiogram - Risk factor modification - Telemetry monitoring - Load with ASA 650 and plavix  300 and then daily DAPT with 81 ASA and Plavix  75 starting tomorrow - Orthostatic vitals - Permissive HTN x 24 hours.  - Cardiac and syncope work up per EDP   ______________________________________________________________________    Bonney SHARK, Eri Mcevers, MD [facility]

## 2024-03-27 NOTE — ED Notes (Signed)
 Lab made aware of added labs.

## 2024-03-27 NOTE — ED Notes (Signed)
 2W staff made aware of Pt's heartrate prior to transport.

## 2024-03-27 NOTE — H&P (Signed)
 History and Physical    Patient: Adam Alvarado FMW:[id number] DOB: May 11, 1956 DOA: 03/27/2024 DOS: the patient was seen and examined on 03/27/2024 PCP: Sheryle Carwin, MD  Patient coming from: Home  Chief Complaint:  Chief Complaint  Patient presents with   Code Stroke   HPI: Adam Alvarado is a 68 y.o. male with medical history significant of gout, non-hodgkin's lymphoma. Patient reports that this morning, while talking on the phone, noticed decreased hearing on his left side with subsequent decreased of function of left hand/arm and falling after left leg became very weak. He reports associated left facial tingling around his mouth. He was unable to get up and so EMS called for evaluation and transport to the ED. Symptoms resolved en route while in the ambulance.  Per EMS, patient noted to have a heart rate down to 25 bpm. Blood pressure attempted but unable to obtain, so patient was administered atropine. Subsequent EKG with sinus rhythm.  Review of Systems: As mentioned in the history of present illness. All other systems reviewed and are negative.  Past Medical History:  Diagnosis Date   Adenomatous colon polyp 05/2000   Barrett's esophagus    Cancer (HCC) 2013   Non-Hodgkins Lymphoma   H/O hiatal hernia    small seen on CT Scan   Hemorrhoid    Other malignant lymphomas, unspecified site, extranodal and solid organ sites 2013   Past Surgical History:  Procedure Laterality Date   BIOPSY  08/20/2012   Procedure: BIOPSY;  Surgeon: Elon CHRISTELLA Pacini, MD;  Location: [facility] OR;  Service: General;  Laterality: N/A;  Laparoscopic biopsy of abdominal mesenteric mass   COLONOSCOPY     LAPAROSCOPY  08/20/2012   Procedure: LAPAROSCOPY DIAGNOSTIC;  Surgeon: Elon CHRISTELLA Pacini, MD;  Location: [facility] OR;  Service: General;  Laterality: N/A;   VASECTOMY  1998   Social History:  reports that he has never smoked. He has never been exposed to tobacco smoke. He has never used smokeless tobacco. He reports  current alcohol use. He reports that he does not use drugs.  No Known Allergies  Family History  Problem Relation Age of Onset   Cancer Mother        Breast   Colon polyps Father    Breast cancer Brother    Colon cancer Neg Hx    Esophageal cancer Neg Hx    Pancreatic cancer Neg Hx    Stomach cancer Neg Hx    Liver disease Neg Hx     Prior to Admission medications   Medication Sig Start Date End Date Taking? Authorizing Provider  albuterol  (VENTOLIN  HFA) 108 (90 Base) MCG/ACT inhaler Inhale 2 puffs into the lungs every 6 (six) hours as needed. 12/09/23   Leath-Warren, Etta PARAS, NP  allopurinol  (ZYLOPRIM ) 100 MG tablet Take 300 mg by mouth daily. 05/27/22   [provider]  benzonatate (TESSALON) 200 MG capsule Take 200 mg by mouth 3 (three) times daily as needed for cough.    [provider]  colchicine  0.6 MG tablet Take 1.2 mg at the first sign of a gout attack followed by 0.6 mg in 1 hour, 0.6 mg 8 hours if needed. Next day 1.2 mg twice daily as needed until flare resolves. 02/15/21   Arloa Suzen RAMAN, NP  esomeprazole  (NEXIUM ) 40 MG capsule Take 1 capsule (40 mg total) by mouth daily at 12 noon. 12/29/20   Aneita Gwendlyn DASEN, MD  HYDROcodone  bit-homatropine [facility]) 5-1.5 MG/5ML syrup Take 5 mLs by  mouth every 6 (six) hours as needed for cough. 12/09/23   Leath-Warren, Etta PARAS, NP  levofloxacin (LEVAQUIN) 750 MG tablet Take 750 mg by mouth daily.    [provider]  ondansetron  (ZOFRAN -ODT) 4 MG disintegrating tablet Take 1 tablet (4 mg total) by mouth every 8 (eight) hours as needed. 12/09/23   Leath-WarrenEtta PARAS, NP    Physical Exam: Vitals:   03/27/24 0847 03/27/24 0910 03/27/24 0913 03/27/24 0915  BP: (!) 130/59 136/60  136/64  Pulse: (!) 58 63  81  Resp:  17  15  Temp:      TempSrc:      SpO2: 99% 100% 97% 100%  Weight:       General exam: Appears calm and comfortable and in no acute distress. Conversant Respiratory: Clear to  auscultation. Respiratory effort normal with no intercostal retractions or use of accessory muscles Cardiovascular: S1 & S2 heard, slow heart rate, normal rhythm. No murmurs, rubs, gallops or clicks. No BLE edema Gastrointestinal: Abdomen is non-distended, soft and non-tender. No masses felt. Normal bowel sounds heard Neurologic: No focal neurological deficits. 5/5 upper and lower strength bilaterally. CN intact. Musculoskeletal: No calf tenderness Skin: No cyanosis. No new rashes Psychiatry: Alert and oriented x4. Memory intact. Mood & affect appropriate   Data Reviewed: There are no new results to review at this time.  Assessment and Plan:  TIA Presumed diagnosis with symptoms of left sided upper/lower extremity weakness with associated facial numbness and hearing loss. Symptoms have resolved. Code stroke on admission with initial CT head significant for no intracranial abnormality. MRI confirms no stroke. CTA head and neck significant for no large vessel occlusion. LDL pending. Hemoglobin A1C pending. Transthoracic Echocardiogram significant pending. Neurology recommendations for TIA workup with formal recommendations pending. PT/OT recommendations for no PT/OT follow-up.  Symptomatic bradycardia Noted per EMS with heart rate as low as 25 bpm. Blood pressure unable to be obtained at the time, so atropine was given. Unclear if bradycardia precipitated neurologic event or was secondary to neurologic event. Patient remains in sinus bradycardia without otherwise abnormal rhythm. Since patient had profound bradycardia, will consult cardiology for evaluation inpatient. Transthoracic Echocardiogram ordered and pending -Continue telemetry -Follow-up Transthoracic Echocardiogram results -Consult cardiology and follow-up recommendations  CKD stage IIIa Noted. Patient's creatinine appears to be stable at this time.  History of non-hodgkin's lymphoma Previously followed by hematology/oncology, Dr.  Cloretta. Patient in remission.   Advance Care Planning:   Code Status: Do not attempt resuscitation (DNR) PRE-ARREST INTERVENTIONS DESIRED. Discussed at bedside with patient with family at bedside.  Consults: Neurology, Cardiology  Family Communication: Wife, son at bedside   Author: Elgin Lam, MD 03/27/2024 9:51 AM  For on call review [email or web address].

## 2024-03-27 NOTE — ED Triage Notes (Signed)
 Per EMS, Pt, from home, presents as a Code Stroke.  Around 0730, P had sudden L sided weakness and numbness.  Pt was diaphoretic and HR initially in 40s.  EMS reports it dropped into 20s briefly.   Pt given Atropine 1mg  en route and HR increased into 50-60s.    NIH 1 upon arrival.

## 2024-03-27 NOTE — Progress Notes (Signed)
 Brief neuro note:  Was notified of a left facial droop that was noted by RN at shift change.  On my evaluation, I initially did not appreciate any left facial droop.  However, upon talking to the patient and his wife, I did see that there were a couple instances where his left face would appear droopy.  Patient also reports that in the afternoon, he had a brief period where his left arm was difficult to lift up.  Reviewed neurology consult note from earlier today and reviewed MRI of the brain and CT angio of the head and neck.  Overall, I suspect that symptoms could be secondary to his stuttering lacunar stroke/TIA.  Will keep head of bed flat, strict bedrest overnight and start him on IV fluids at 100 cc/h overnight.  Patient was already loaded with aspirin  and Plavix  earlier today.  Plan discussed with patient, wife at the bedside and later with RN outside the room and with Dr. Keturah with the overnight hospitalist team over secure chat.  Adam Alvarado [facility]

## 2024-03-27 NOTE — Progress Notes (Addendum)
 Assessed patient and noticed a slight L side droop to his facial (lip) area. Patient is not aware of this, but patient's wife (at bedside) noticed the change too. Patient was able to stick out his tongue, speak and open his mouth with no complication. Performed NIH (see chart for further information). Informed provider on nights, Dr. Segars regarding this new finding at 20:40 pm.

## 2024-03-27 NOTE — Evaluation (Signed)
 Occupational Therapy Evaluation Patient Details Name: Adam Alvarado MRN: [id number] DOB: 06-Mar-1956 Today's Date: 03/27/2024   History of Present Illness   68 y.o. male presents to [facility] 03/27/24 with L sided weakness/numbness, also found to have bradycardia ruled as code stroke. MRI and CT negative for acute abnormalities. PMHx: gout, syncope, non-Hodgkin's lymphoma     Clinical Impressions Pt admitted for above, PTA pt was ind with Adls and mobility. He currently presents close to baseline, remaining ind with mobility and ADLs. No noted coordination, strength, ROM or perceptual deficits noted. His sensation is intact, he did note a period of LUE numbness 20 mins prior to OT eval and he reports that the RN is aware. OT signing off, reconsult if needed. No post acute OT recommended.      If plan is discharge home, recommend the following:   Other (comment) (PRN)     Functional Status Assessment   Patient has not had a recent decline in their functional status     Equipment Recommendations   None recommended by OT     Recommendations for Other Services         Precautions/Restrictions   Precautions Precautions: Fall Restrictions Weight Bearing Restrictions Per Provider Order: No     Mobility Bed Mobility Overal bed mobility: Independent                  Transfers Overall transfer level: Needs assistance Equipment used: None Transfers: Sit to/from Stand Sit to Stand: Supervision           General transfer comment: supervision for safety      Balance Overall balance assessment: Mild deficits observed, not formally tested                                         ADL either performed or assessed with clinical judgement   ADL Overall ADL's : Independent                                             Vision   Vision Assessment?: No apparent visual deficits Tracking/Visual Pursuits: Decreased smoothness of  vertical tracking (slightly in the L eye but does not cause any deficits)     Perception Perception: Within Functional Limits       Praxis Praxis: WFL       Pertinent Vitals/Pain Pain Assessment Pain Assessment: Faces Faces Pain Scale: Hurts little more Pain Location: L knee Pain Descriptors / Indicators: Discomfort Pain Intervention(s): Monitored during session, Limited activity within patient's tolerance     Extremity/Trunk Assessment Upper Extremity Assessment Upper Extremity Assessment: Overall WFL for tasks assessed (strength, coordination, and sensation WFL)   Lower Extremity Assessment Lower Extremity Assessment: Overall WFL for tasks assessed (strength, coordination, and sensation WFL)   Cervical / Trunk Assessment Cervical / Trunk Assessment: Normal   Communication Communication Communication: No apparent difficulties   Cognition Arousal: Alert Behavior During Therapy: WFL for tasks assessed/performed Cognition: No apparent impairments                               Following commands: Intact       Cueing  General Comments   Cueing Techniques: Verbal cues  Exercises     Shoulder Instructions      Home Living Family/patient expects to be discharged to:: Private residence Living Arrangements: Spouse/significant other Available Help at Discharge: Family;Available 24 hours/day Type of Home: House Home Access: Stairs to enter [employer] of Steps: 1   Home Layout: Two level;Able to live on main level with bedroom/bathroom Alternate Level Stairs-Number of Steps: 13 Alternate Level Stairs-Rails: Left Bathroom Shower/Tub: Tub/shower unit;Walk-in shower (normally uses tub shower)   Bathroom Toilet: Standard         Additional Comments: Has access to DME per pt report. 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      Prior Functioning/Environment Prior Level of Function : Independent/Modified Independent;Driving;Working/employed              Mobility Comments: ind ADLs Comments: ind    OT Problem List: Other (comment) (Resolved)   OT Treatment/Interventions:        OT Goals(Current goals can be found in the care plan section)   Acute Rehab OT Goals Patient Stated Goal: go home OT Goal Formulation: All assessment and education complete, DC therapy Time For Goal Achievement: 04/10/24 Potential to Achieve Goals: Good   OT Frequency:       Co-evaluation              AM-PAC OT 6 Clicks Daily Activity     Outcome Measure Help from another person eating meals?: None Help from another person taking care of personal grooming?: None Help from another person toileting, which includes using toliet, bedpan, or urinal?: None Help from another person bathing (including washing, rinsing, drying)?: None Help from another person to put on and taking off regular upper body clothing?: None Help from another person to put on and taking off regular lower body clothing?: None 6 Click Score: 24   End of Session Nurse Communication: Mobility status  Activity Tolerance: Patient tolerated treatment well Patient left: Other (comment) (Pt left in care of providing PT during hall mobility)  OT Visit Diagnosis: Other abnormalities of gait and mobility (R26.89)                Time: 8482-8465 OT Time Calculation (min): 17 min Charges:  OT General Charges $OT Visit: 1 Visit OT Evaluation $OT Eval Low Complexity: 1 Low  03/27/2024  AB, OTR/L  Acute Rehabilitation Services  Office: [phone]   Curtistine JONETTA Das 03/27/2024, 4:23 PM

## 2024-03-27 NOTE — ED Provider Notes (Signed)
 [place] EMERGENCY DEPARTMENT AT [facility] Provider Note   CSN: [id number] Arrival date & time: 03/27/24  9160  An emergency department physician performed an initial assessment on this suspected stroke patient at [phone].  Patient presents with: Code Stroke   Adam Alvarado is a 68 y.o. male.   HPI    68 year old male with history of gout, non-Hodgkin's lymphoma in 2014 who presents as a code stroke for transient left-sided numbness and weakness, and was also found to have bradycardia and hypotension with EMS.  He reports that he woke up in a normal state of health had taken a shower and was continuing to get ready when he made a phone call, and while he was on the phone at 7:30 AM he developed sudden weakness and numbness of his left arm and leg.  He reports that he began to have this weakness and numbness and slid off of the bed.  He then began to feel lightheaded.  He felt that his speech was slurred and his mouth was dry.  He denied any vision changes, word finding difficulty, chest pain, shortness of breath, vomiting.  His wife did not notice any facial droop.  She heard him fall and called out immediately and went to his side.  His leg was bent underneath him.   When EMS arrived, they found his heart rate to be 26 and his blood pressures were also very low.  They gave him 1 mg of atropine in his home with improvement of his heart rate.  The patient reports that he was so diaphoretic that he sweated off the EKG leads.  They obtained the EKG once he was in the back of the ambulance per patient although I do not have it at his bedside.  Per the patient he was told his EKG did not show any abnormalities that EMS detected.  They gave him 600 cc of fluid.  He began to feel improved and at this time does not have any weakness or numbness.  He reports prior syncope when he was sawing wood.  Has not had any medication changes.  Past Medical History:  Diagnosis Date   Adenomatous  colon polyp 05/2000   Barrett's esophagus    Cancer (HCC) 2013   Non-Hodgkins Lymphoma   H/O hiatal hernia    small seen on CT Scan   Hemorrhoid    Other malignant lymphomas, unspecified site, extranodal and solid organ sites 2013     Prior to Admission medications   Medication Sig Start Date End Date Taking? Authorizing Provider  albuterol  (VENTOLIN  HFA) 108 (90 Base) MCG/ACT inhaler Inhale 2 puffs into the lungs every 6 (six) hours as needed. 12/09/23   Leath-Warren, Etta PARAS, NP  allopurinol  (ZYLOPRIM ) 100 MG tablet Take 300 mg by mouth daily. 05/27/22   [provider]  benzonatate (TESSALON) 200 MG capsule Take 200 mg by mouth 3 (three) times daily as needed for cough.    [provider]  colchicine  0.6 MG tablet Take 1.2 mg at the first sign of a gout attack followed by 0.6 mg in 1 hour, 0.6 mg 8 hours if needed. Next day 1.2 mg twice daily as needed until flare resolves. 02/15/21   Arloa Suzen RAMAN, NP  esomeprazole  (NEXIUM ) 40 MG capsule Take 1 capsule (40 mg total) by mouth daily at 12 noon. 12/29/20   Aneita Gwendlyn DASEN, MD  HYDROcodone  bit-homatropine [facility]) 5-1.5 MG/5ML syrup Take 5 mLs by mouth every 6 (six) hours  as needed for cough. 12/09/23   Leath-Warren, Etta PARAS, NP  levofloxacin (LEVAQUIN) 750 MG tablet Take 750 mg by mouth daily.    [provider]  ondansetron  (ZOFRAN -ODT) 4 MG disintegrating tablet Take 1 tablet (4 mg total) by mouth every 8 (eight) hours as needed. 12/09/23   Leath-Warren, Etta PARAS, NP    Allergies: Patient has no known allergies.    Review of Systems  Updated Vital Signs BP 136/64   Pulse 81   Temp 97.8 F (36.6 C) (Oral)   Resp 15   Wt 105 kg   SpO2 100%   BMI 31.39 kg/m   Physical Exam Vitals and nursing note reviewed.  Constitutional:      General: He is not in acute distress.    Appearance: Normal appearance. He is well-developed. He is not ill-appearing or diaphoretic.  HENT:     Head:  Normocephalic and atraumatic.  Eyes:     General: No visual field deficit.    Extraocular Movements: Extraocular movements intact.     Conjunctiva/sclera: Conjunctivae normal.     Pupils: Pupils are equal, round, and reactive to light.  Cardiovascular:     Rate and Rhythm: Normal rate and regular rhythm.     Pulses: Normal pulses.     Heart sounds: Normal heart sounds. No murmur heard.    No friction rub. No gallop.  Pulmonary:     Effort: Pulmonary effort is normal. No respiratory distress.     Breath sounds: Normal breath sounds. No wheezing or rales.  Abdominal:     General: There is no distension.     Palpations: Abdomen is soft.     Tenderness: There is no abdominal tenderness. There is no guarding.  Musculoskeletal:        General: No swelling or tenderness.     Cervical back: Normal range of motion.  Skin:    General: Skin is warm and dry.     Findings: No erythema or rash.  Neurological:     General: No focal deficit present.     Mental Status: He is alert and oriented to person, place, and time.     GCS: GCS eye subscore is 4. GCS verbal subscore is 5. GCS motor subscore is 6.     Cranial Nerves: No cranial nerve deficit, dysarthria or facial asymmetry.     Sensory: No sensory deficit.     Motor: No weakness or tremor.     Coordination: Coordination normal. Finger-Nose-Finger Test normal.     (all labs ordered are listed, but only abnormal results are displayed) Labs Reviewed  COMPREHENSIVE METABOLIC PANEL WITH GFR - Abnormal; Notable for the following components:      Result Value   Glucose, Bld 134 (*)    Creatinine, Ser 1.43 (*)    Calcium 8.3 (*)    Total Protein 6.0 (*)    Total Bilirubin 1.5 (*)    GFR, Estimated 54 (*)    All other components within normal limits  CBG MONITORING, ED - Abnormal; Notable for the following components:   Glucose-Capillary 125 (*)    All other components within normal limits  I-STAT CHEM 8, ED - Abnormal; Notable for the  following components:   Creatinine, Ser 1.30 (*)    Glucose, Bld 128 (*)    Calcium, Ion 1.02 (*)    All other components within normal limits  ETHANOL  PROTIME-INR  APTT  CBC  DIFFERENTIAL  RAPID URINE DRUG SCREEN, HOSP PERFORMED  MAGNESIUM   TROPONIN I (HIGH SENSITIVITY)    EKG: EKG Interpretation Date/Time:  Wednesday March 27 2024 09:05:20 EDT Ventricular Rate:  60 PR Interval:  186 QRS Duration:  119 QT Interval:  421 QTC Calculation: 421 R Axis:   57  Text Interpretation: Sinus rhythm Nonspecific intraventricular conduction delay No significant change since last tracing Confirmed by Dreama Longs ([id number]) on 03/27/2024 9:30:28 AM  Radiology: CT HEAD CODE STROKE WO CONTRAST Result Date: 03/27/2024 EXAM: CT HEAD WITHOUT _study_datetime_ TECHNIQUE: CT of the head was performed without the administration of intravenous contrast. Automated exposure control, iterative reconstruction, and/or weight based adjustment of the mA/kV was utilized to reduce the radiation dose to as low as reasonably achievable. COMPARISON: None available. CLINICAL HISTORY: FINDINGS: BRAIN AND VENTRICLES: No acute intracranial hemorrhage. No mass effect or midline shift. No extra-axial fluid collection. Gray-white differentiation is maintained. No hydrocephalus. ORBITS: No acute abnormality. SINUSES AND MASTOIDS: Mild mucosal thickening in the ethmoid sinuses and right maxillary sinus. Mastoid air cells are clear. SOFT TISSUES AND SKULL: No acute skull fracture. No acute soft tissue abnormality. [place] Stroke Program Early CT (ASPECT) score: Ganglionic (caudate, internal capsule, lentiform nucleus, insula, M1-M3): 7 Supraganglionic (M4-M6): 3 Total: 10 IMPRESSION: 1. No acute intracranial abnormality. 2. Findings messaged to Dr. Lindzen via the [facility] messaging system at 9:08AM on 03/27/24. Electronically signed by: Donnice Mania MD 03/27/2024 09:09 AM EDT RP Workstation: [id number]     Procedures    Medications Ordered in the ED  iohexol  (OMNIPAQUE ) 350 MG/ML injection 75 mL (75 mLs Intravenous Contrast Given 03/27/24 0854)                                        68 year old male with history of gout, non-Hodgkin's lymphoma in 2014 who presents as a code stroke for transient left-sided numbness and weakness, and was also found to have bradycardia and hypotension with EMS.  Symptoms resolved on arrival, with history and exam most consistent with a TIA, with additional concern for transient bradycardia.  His EKG on arrival to the emergency department shows a normal sinus rhythm.  Labs completed and personally evaluated interpreted by me show no clinically significant electrolyte abnormality, no anemia, no leukocytosis.  CT head was completed and personally abided by me, radiology and neurology and shows no acute abnormalities.  CTA was completed which shows moderate focal P2 narrowing right PCA.  Will plan on admission for TIA evaluation, in addition to continued monitoring in the setting of transient bradycardia witnessed by EMS (?vagal episode in response to TIA, other transient bradycardia/arrhythmia not captured, primary bradycardia leading to TIA symptoms?)      Final diagnoses:  TIA (transient ischemic attack)  Bradycardia    ED Discharge Orders     None          Dreama Longs, MD 03/27/24 [phone]

## 2024-03-27 NOTE — ED Notes (Signed)
 Patient transported to MRI

## 2024-03-27 NOTE — Consult Note (Signed)
 Cardiology Consultation:   Patient ID: Adam Alvarado; [id number]; 06/01/1956   Admit date: 03/27/2024 Date of Consult: 03/27/2024  Primary Care Provider: Sheryle Carwin, MD Primary Cardiologist: None  Primary Electrophysiologist:  None   Patient Profile:   Adam Alvarado is a 68 y.o. male without a cardiac history who is being seen today for the evaluation of bradycardia at the request of Dr. Avie.  History of Present Illness:   Adam Alvarado has no past cardiac history.  He presented with an episode of left hand and arm weakness that happened acutely today.  EMS was called.  I reviewed the run sheet.  On arriving at the seen the patient was awake but pale and weak and sitting on the flootr.  EMS reported bradycardia with rates as low as 25.  Initial BP was low.  He was treated with atropine.  Initial EKG at the seen demonstrated sinus bradycardia.     In the ED MRI demonstrated no abnormalities.  Head CT was normal.  EKG was without acute changes and enzymes were negative.   He said that he was at home when he felt the left had weakness and could not move it.  He slid to the floor of the bed with his leg under him and he was unable to move his arm or his leg until he was in the ambulance.  He has not had symptoms like this before.  He has otherwise been health.  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  The patient denies any new symptoms such as chest discomfort, neck or arm discomfort. There has been no new shortness of breath, PND or orthopnea. There have been no reported palpitations, presyncope or syncope.  He has a distant past history of syncope.     Of note I do see an echo from 2014 which was essentially normal.  This was done at the time of chemotherapy.     Past Medical History:  Diagnosis Date   Adenomatous colon polyp 05/2000   Barrett's esophagus    Cancer (HCC) 2013   Non-Hodgkins Lymphoma   H/O hiatal hernia    small seen on CT  Scan   Hemorrhoid    Other malignant lymphomas, unspecified site, extranodal and solid organ sites 2013    Past Surgical History:  Procedure Laterality Date   BIOPSY  08/20/2012   Procedure: BIOPSY;  Surgeon: Elon CHRISTELLA Pacini, MD;  Location: [facility] OR;  Service: General;  Laterality: N/A;  Laparoscopic biopsy of abdominal mesenteric mass   COLONOSCOPY     LAPAROSCOPY  08/20/2012   Procedure: LAPAROSCOPY DIAGNOSTIC;  Surgeon: Elon CHRISTELLA Pacini, MD;  Location: [facility] OR;  Service: General;  Laterality: N/A;   VASECTOMY  1998     Home Medications:  Prior to Admission medications   Medication Sig Start Date End Date Taking? Authorizing Provider  allopurinol  (ZYLOPRIM ) 300 MG tablet Take 300 mg by mouth daily.   Yes [provider]  colchicine  0.6 MG tablet Take 1.2 mg at the first sign of a gout attack followed by 0.6 mg in 1 hour, 0.6 mg 8 hours if needed. Next day 1.2 mg twice daily as needed until flare resolves. 02/15/21  Yes Arloa Suzen RAMAN, NP  esomeprazole  (NEXIUM ) 40 MG capsule Take 1 capsule (40 mg total) by mouth daily at 12 noon. 12/29/20  Yes Aneita Gwendlyn DASEN, MD  albuterol  (VENTOLIN  HFA) 108 [phone] Base) MCG/ACT inhaler  Inhale 2 puffs into the lungs every 6 (six) hours as needed. Patient not taking: Reported on 03/27/2024 12/09/23   Leath-Warren, Etta PARAS, NP  HYDROcodone  bit-homatropine (HYCODAN) 5-1.5 MG/5ML syrup Take 5 mLs by mouth every 6 (six) hours as needed for cough. Patient not taking: Reported on 03/27/2024 12/09/23   Leath-Warren, Etta PARAS, NP  ondansetron  (ZOFRAN -ODT) 4 MG disintegrating tablet Take 1 tablet (4 mg total) by mouth every 8 (eight) hours as needed. Patient not taking: Reported on 03/27/2024 12/09/23   Leath-Warren, Etta PARAS, NP    Inpatient Medications: Scheduled Meds:  [START ON 03/28/2024]  stroke: early stages of recovery book   Does not apply Once   allopurinol   300 mg Oral Daily   enoxaparin  (LOVENOX ) injection  50 mg Subcutaneous Q24H    pantoprazole   40 mg Oral Daily   Continuous Infusions:  PRN Meds: acetaminophen  **OR** acetaminophen  (TYLENOL ) oral liquid 160 mg/5 mL **OR** acetaminophen , senna-docusate  Allergies:   No Known Allergies  Social History:   Social History   Socioeconomic History   Marital status: Married    Spouse name: Not on file   Number of children: Not on file   Years of education: Not on file   Highest education level: Not on file  Occupational History   Not on file  Tobacco Use   Smoking status: Never    Passive exposure: Never   Smokeless tobacco: Never  Vaping Use   Vaping status: Never Used  Substance and Sexual Activity   Alcohol use: Yes    Comment: on occassion   Drug use: No   Sexual activity: Never  Other Topics Concern   Not on file  Social History Narrative   Not on file   Social Drivers of Health   Financial Resource Strain: Not on file  Food Insecurity: No Food Insecurity (03/27/2024)   Hunger Vital Sign    Worried About Running Out of Food in the Last Year: Never true    Ran Out of Food in the Last Year: Never true  Transportation Needs: No Transportation Needs (03/27/2024)   PRAPARE - Administrator, Civil Service (Medical): No    Lack of Transportation (Non-Medical): No  Physical Activity: Not on file  Stress: Not on file  Social Connections: Socially Integrated (03/27/2024)   Social Connection and Isolation Panel    Frequency of Communication with Friends and Family: More than three times a week    Frequency of Social Gatherings with Friends and Family: More than three times a week    Attends Religious Services: 1 to 4 times per year    Active Member of [employer] or Organizations: No    Attends Engineer, structural: 1 to 4 times per year    Marital Status: Married  Catering manager Violence: Not At Risk (03/27/2024)   Humiliation, Afraid, Rape, and Kick questionnaire    Fear of Current or Ex-Partner: No    Emotionally Abused: No     Physically Abused: No    Sexually Abused: No    Family History:    Family History  Problem Relation Age of Onset   Cancer Mother        Breast   Colon polyps Father    Breast cancer Brother    Colon cancer Neg Hx    Esophageal cancer Neg Hx    Pancreatic cancer Neg Hx    Stomach cancer Neg Hx    Liver disease Neg Hx  ROS:  Please see the history of present illness.    All other ROS reviewed and negative.     Physical Exam/Data:   Vitals:   03/27/24 1110 03/27/24 1115 03/27/24 1138 03/27/24 1140  BP:  (!) 142/73 (!) 151/69   Pulse:  (!) 44 (!) 44   Resp:  (!) 24    Temp: 98 F (36.7 C)  98.4 F (36.9 C)   TempSrc: Oral  Oral   SpO2:  100%    Weight:      Height:    6' (1.829 m)   No intake or output data in the 24 hours ending 03/27/24 1631 Filed Weights   03/27/24 0800  Weight: 105 kg   Body mass index is 31.39 kg/m.  GENERAL:  Wellappearing HEENT:   Pupils equal round and reactive, fundi not visualized, oral mucosa unremarkable NECK:  No  jugular venous distention, waveform within normal limits, carotid upstroke brisk and symmetric, no bruits, no thyromegaly LYMPHATICS:  No cervical, inguinal adenopathy LUNGS:   Clear to auscultation bilaterally BACK:  No CVA tenderness CHEST:   Unremarkable HEART:  PMI not displaced or sustained,S1 and S2 within normal limits, no S3, no S4, no clicks, no rubs, no murmurs ABD:  Flat, positive bowel sounds normal in frequency in pitch, no bruits, no rebound, no guarding, no midline pulsatile mass, no hepatomegaly, no splenomegaly EXT:  2 plus pulses throughout, no  edema, no cyanosis no clubbing SKIN:  No rashes no nodules NEURO:   Cranial nerves II through XII grossly intact, motor grossly intact throughout PSYCH:    Cognitively intact, oriented to person place and time   EKG:  The EKG was personally reviewed and demonstrates:   Telemetry:  Telemetry was personally reviewed and demonstrates:  Sinus  bradycardia  Relevant CV Studies: Echo pending  Laboratory Data:  Chemistry Recent Labs  Lab 03/27/24 0842 03/27/24 0845  NA 142 142  K 4.4 4.3  CL 109 108  CO2 23  --   GLUCOSE 134* 128*  BUN 14 17  CREATININE 1.43* 1.30*  CALCIUM 8.3*  --   GFRNONAA 54*  --   ANIONGAP 10  --     Recent Labs  Lab 03/27/24 0842  PROT 6.0*  ALBUMIN 3.5  AST 26  ALT 20  ALKPHOS 66  BILITOT 1.5*   Hematology Recent Labs  Lab 03/27/24 0842 03/27/24 0845  WBC 5.5  --   RBC 4.61  --   HGB 14.0 14.3  HCT 42.4 42.0  MCV 92.0  --   MCH 30.4  --   MCHC 33.0  --   RDW 13.8  --   PLT 171  --    Cardiac EnzymesNo results for input(s): TROPONINI in the last 168 hours. No results for input(s): TROPIPOC in the last 168 hours.  BNPNo results for input(s): BNP, PROBNP in the last 168 hours.  DDimer No results for input(s): DDIMER in the last 168 hours.  Radiology/Studies:  MR BRAIN WO CONTRAST Result Date: 03/27/2024 EXAM: MRI BRAIN WITHOUT CONTRAST 03/27/2024 10:21:14 AM TECHNIQUE: Multiplanar multisequence MRI of the head/brain was performed without the administration of intravenous contrast. COMPARISON: Same day CT head and CTA head and neck. CLINICAL HISTORY: Neuro deficit, acute, stroke suspected. Low heart rate in 40s. Transient left-sided numbness and weakness, and was also found to have bradycardia and hypotension with EMS. FINDINGS: BRAIN AND VENTRICLES: No acute infarct. No intracranial hemorrhage. No mass. No midline shift. No hydrocephalus. The sella is  unremarkable. Normal flow voids. ORBITS: No acute abnormality. SINUSES AND MASTOIDS: Mild mucosal thickening in the ethmoid sinuses. No acute abnormality. BONES AND SOFT TISSUES: Normal marrow signal. No acute soft tissue abnormality. IMPRESSION: 1. No acute intracranial abnormality. Electronically signed by: Donnice Mania MD 03/27/2024 11:00 AM EDT RP Workstation: [id number]   CT ANGIO HEAD NECK W WO CM (CODE STROKE) Result  Date: 03/27/2024 EXAM: CTA HEAD AND NECK WITH AND WITHOUT 03/27/2024 08:54:01 AM TECHNIQUE: CTA of the head and neck was performed with and without the administration of intravenous contrast. Multiplanar 2D and/or 3D reformatted images are provided for review. Automated exposure control, iterative reconstruction, and/or weight based adjustment of the mA/kV was utilized to reduce the radiation dose to as low as reasonably achievable. Stenosis of the internal carotid arteries measured using NASCET criteria. COMPARISON: None available CLINICAL HISTORY: Neuro deficit, acute, stroke suspected. Left sided weakness and numbness. Code stroke. FINDINGS: CTA NECK: AORTIC ARCH AND ARCH VESSELS: Mild atherosclerosis of the proximal right subclavian artery without significant stenosis. CERVICAL CAROTID ARTERIES: The right cervical carotid artery is patent. Mild atherosclerosis at the right carotid bifurcation without hemodynamically significant stenosis. The left cervical carotid artery is patent. Mild atherosclerosis at the carotid bifurcation without hemodidymically significant stenosis. CERVICAL VERTEBRAL ARTERIES: No dissection, arterial injury, or significant stenosis. LUNGS AND MEDIASTINUM: Unremarkable. SOFT TISSUES: Ateal appearance of the upper thoracic esophagus. BONES: Degenerative changes in the cervical spine with disc space narrowing was pronounced at C5-6. CTA HEAD: ANTERIOR CIRCULATION: No significant stenosis of the internal carotid arteries. No significant stenosis of the anterior cerebral arteries. No significant stenosis of the middle cerebral arteries. No aneurysm. POSTERIOR CIRCULATION: There is focal moderate narrowing of the P2 segment of the right PCA. No significant stenosis of the basilar artery. No significant stenosis of the vertebral arteries. No aneurysm. OTHER: No dural venous sinus thrombosis on this non-dedicated study. IMPRESSION: 1. No large vessel occlusion. 2. Focal moderate narrowing of the  P2 segment of the right PCA. 3. Mild atherosclerosis as above. Electronically signed by: Donnice Mania MD 03/27/2024 09:35 AM EDT RP Workstation: [id number]   CT HEAD CODE STROKE WO CONTRAST Result Date: 03/27/2024 EXAM: CT HEAD WITHOUT _study_datetime_ TECHNIQUE: CT of the head was performed without the administration of intravenous contrast. Automated exposure control, iterative reconstruction, and/or weight based adjustment of the mA/kV was utilized to reduce the radiation dose to as low as reasonably achievable. COMPARISON: None available. CLINICAL HISTORY: FINDINGS: BRAIN AND VENTRICLES: No acute intracranial hemorrhage. No mass effect or midline shift. No extra-axial fluid collection. Gray-white differentiation is maintained. No hydrocephalus. ORBITS: No acute abnormality. SINUSES AND MASTOIDS: Mild mucosal thickening in the ethmoid sinuses and right maxillary sinus. Mastoid air cells are clear. SOFT TISSUES AND SKULL: No acute skull fracture. No acute soft tissue abnormality. [place] Stroke Program Early CT (ASPECT) score: Ganglionic (caudate, internal capsule, lentiform nucleus, insula, M1-M3): 7 Supraganglionic (M4-M6): 3 Total: 10 IMPRESSION: 1. No acute intracranial abnormality. 2. Findings messaged to Dr. Lindzen via the [facility] messaging system at 9:08AM on 03/27/24. Electronically signed by: Donnice Mania MD 03/27/2024 09:09 AM EDT RP Workstation: [id number]    Assessment and Plan:   Bradycardia: He had transient bradycardia and hypotension.  I did not see prolonged pauses documented that his heart rate was said to be in the 20s.  Unclear the etiology.  Could possibly be vagal versus primary arrhythmia.  Will plan an outpatient 4-week monitor.   Check TSH  TIA:  Neurology has seen the patient and  imaging is as above.  Echo has been ordered.  Orthostatic BPs have been ordered.   Further plan per primary team.  He will be getting an outpatient monitor as above.  CKD IIIa:  Creat is mildly elevated  but it was also 1.5 3 years ago.  He says this has been chronic since his chemotherapy.      For questions or updates, please contact [facility] Please consult [email or web address] for contact info under Cardiology/STEMI.   Signed, Lynwood Schilling, MD  03/27/2024 4:31 PM

## 2024-03-27 NOTE — Code Documentation (Signed)
 Stroke Response Nurse Documentation Code Documentation  Adam Alvarado is a 68 y.o. male arriving to [facility]  via [facility] on 03/27/2024 with past medical hx of cancer and Barrett's esophagus. On No antithrombotic. Code stroke was activated by EMS.   Patient from home where he was LKW while talking on the phone at 0730 when he slid off the bed and dropped the phone due to left sided weakness. He reports left weakness and numbness. EMS reports he was diaphoretic and bradycardic with heart rate of 25. Given atropine once en route.   Stroke team at the bedside on patient arrival. Labs drawn and patient cleared for CT by Dr. Garrick. Patient to CT with team. NIHSS 0, see documentation for details and code stroke times. Patient has slight weakness in left hand on exam. The following imaging was completed:  CT Head and CTA. Patient is not a candidate for IV Thrombolytic due to symptoms resolved. Patient is not a candidate for IR due to no LVO.   Care Plan: TIA alert-Q2 VS and NIHSS x12hours then q4. MRI ordered.    Bedside handoff with ED RN.    Madelin Manila Stroke Response RN

## 2024-03-27 NOTE — Plan of Care (Signed)

## 2024-03-27 NOTE — ED Notes (Signed)
 CCMD made aware.

## 2024-03-28 ENCOUNTER — Observation Stay (HOSPITAL_COMMUNITY)

## 2024-03-28 ENCOUNTER — Other Ambulatory Visit: Payer: Self-pay | Admitting: Cardiology

## 2024-03-28 DIAGNOSIS — R001 Bradycardia, unspecified: Secondary | ICD-10-CM | POA: Diagnosis not present

## 2024-03-28 DIAGNOSIS — G459 Transient cerebral ischemic attack, unspecified: Secondary | ICD-10-CM | POA: Diagnosis not present

## 2024-03-28 LAB — LIPID PANEL
Cholesterol: 110 mg/dL (ref 0–200)
HDL: 38 mg/dL — ABNORMAL LOW (ref >40)
LDL Cholesterol: 48 mg/dL (ref 0–99)
Total CHOL/HDL Ratio: 2.9 ratio
Triglycerides: 118 mg/dL (ref <150)
VLDL: 24 mg/dL (ref 0–40)

## 2024-03-28 LAB — TSH: TSH: 1.592 u[IU]/mL (ref 0.350–4.500)

## 2024-03-28 LAB — HIV ANTIBODY (ROUTINE TESTING W REFLEX): HIV Screen 4th Generation wRfx: NONREACTIVE

## 2024-03-28 MED ORDER — SODIUM CHLORIDE 0.9 % IV BOLUS
1000.0000 mL | Freq: Once | INTRAVENOUS | Status: AC
  Administered 2024-03-28: 1000 mL via INTRAVENOUS

## 2024-03-28 NOTE — Progress Notes (Addendum)
 STROKE TEAM PROGRESS NOTE   SUBJECTIVE (INTERIM HISTORY)  Wife is at bedside. Neurological examination is normal, with the patient's only complaint being persistent tingling of the left fingertips, left foot, and left side of mouth. Plan for discharge tomorrow with a 30-day cardiac monitor to evaluate for possible etiologies of his stroke-like symptoms. MRI negative for stroke.CT angio shows moderate right P 2 stenosis  OBJECTIVE  CBC    Component Value Date/Time   WBC 5.5 03/27/2024 0842   RBC 4.61 03/27/2024 0842   HGB 14.3 03/27/2024 0845   HGB 15.1 01/09/2014 0943   HCT 42.0 03/27/2024 0845   HCT 45.9 01/09/2014 0943   PLT 171 03/27/2024 0842   PLT 199 01/09/2014 0943   MCV 92.0 03/27/2024 0842   MCV 89.5 01/09/2014 0943   MCH 30.4 03/27/2024 0842   MCHC 33.0 03/27/2024 0842   RDW 13.8 03/27/2024 0842   RDW 13.7 01/09/2014 0943   LYMPHSABS 1.5 03/27/2024 0842   LYMPHSABS 1.0 01/09/2014 0943   MONOABS 0.5 03/27/2024 0842   MONOABS 0.5 01/09/2014 0943   EOSABS 0.1 03/27/2024 0842   EOSABS 0.1 01/09/2014 0943   BASOSABS 0.0 03/27/2024 0842   BASOSABS 0.0 01/09/2014 0943    BMET    Component Value Date/Time   NA 142 03/27/2024 0845   NA 143 01/09/2014 0943   K 4.3 03/27/2024 0845   K 4.3 01/09/2014 0943   CL 108 03/27/2024 0845   CL 108 (H) 01/09/2013 0911   CO2 23 03/27/2024 0842   CO2 22 01/09/2014 0943   GLUCOSE 128 (H) 03/27/2024 0845   GLUCOSE 95 01/09/2014 0943   GLUCOSE 134 (H) 01/09/2013 0911   BUN 17 03/27/2024 0845   BUN 16.7 01/09/2014 0943   CREATININE 1.30 (H) 03/27/2024 0845   CREATININE 1.3 01/09/2014 0943   CALCIUM 8.3 (L) 03/27/2024 0842   CALCIUM 9.1 01/09/2014 0943   GFRNONAA 54 (L) 03/27/2024 0842    IMAGING past 24 hours No results found.  Vitals:   03/27/24 2028 03/27/24 2332 03/28/24 0338 03/28/24 0820  BP: 127/62 (!) 143/61 136/64 (!) 164/77  Pulse: (!) 50 74 (!) 56 (!) 51  Resp: 18 18 18 18   Temp: 99.3 F (37.4 C) 98.1 F  (36.7 C) 97.9 F (36.6 C) 98.1 F (36.7 C)  TempSrc: Oral  Oral   SpO2: 98% 98% 97% 100%  Weight:      Height:         PHYSICAL EXAM General:  Alert, well-nourished, well-developed patient in no acute distress CV: Regular rhythm and bradycardic on monitor Respiratory:  Regular, unlabored respirations on room air   NEURO:  Mental Status: AA&Ox3, patient is able to give clear and coherent history Speech/Language: speech is without dysarthria or aphasia.  Naming, repetition, fluency, and comprehension intact.  Cranial Nerves:  II: PERRL. Visual fields full.  III, IV, VI: EOMI. Eyelids elevate symmetrically.  V: Sensation is intact to light touch and symmetrical to face.  VII: Face is symmetrical resting and smiling VIII: hearing intact to voice. IX, X: Palate elevates symmetrically. Phonation is normal.  KP:Dynloizm shrug 5/5. XII: tongue is midline without fasciculations. Motor: 5/5 strength to all muscle groups tested.  Tone: is normal and bulk is normal Sensation- Intact to light touch bilaterally. Extinction absent to light touch to DSS. Reports subtle tingling in left fingertips and around mouth Coordination: FTN intact bilaterally, HKS: no ataxia in BLE.No drift.  Gait- deferred  Most Recent NIH  Dizziness Present: No (  08/14 1532) Headache Present: No (08/14 1532) Interval: Shift assessment (08/14 1532) Level of Consciousness (1a.)   : Alert, keenly responsive (08/14 1532) LOC Questions (1b. )   : Answers both questions correctly (08/14 1532) LOC Commands (1c. )   : Performs both tasks correctly (08/14 1532) Best Gaze (2. )  : Normal (08/14 1532) Visual (3. )  : No visual loss (08/14 1532) Facial Palsy (4. )    : Minor paralysis (08/14 1532) Motor Arm, Left (5a. )   : No drift (08/14 1532) Motor Arm, Right (5b. ) : No drift (08/14 1532) Motor Leg, Left (6a. )  : No drift (08/14 1532) Motor Leg, Right (6b. ) : No drift (08/14 1532) Limb Ataxia (7. ): Absent (08/14  1532) Sensory (8. )  : Mild-to-moderate sensory loss, patient feels pinprick is less sharp or is dull on the affected side, or there is a loss of superficial pain with pinprick, but patient is aware of being touched (08/14 1532) Best Language (9. )  : No aphasia (08/14 1532) Dysarthria (10. ): Normal (08/14 1532) Extinction/Inattention (11.)   : No Abnormality (08/14 1532) Complete NIHSS TOTAL: 2 (08/14 1532)  ASSESSMENT/PLAN [name]. Adam Alvarado is a 68 year old male with a history of gout and non-Hodgkin's lymphoma, admitted for acute onset left-sided numbness, weakness, and diaphoresis. Symptoms resolved within one hour en route to the hospital. NIHSS on admission was 0. Neurological examination is normal except for persistent circumoral, L hand, and L foot tingling. Despite normal neuroimaging, differential diagnosis includes stroke based on clinical assessment versus TIA. Patient will be discharged on 30 day cardiac monitor. Plan is to discharge tomorrow pending echo.   Acute Ischemic Infarct: Stroke determined by clinical assessment vs TIA Etiology:  Unknown, possible cardiac etiology Code Stroke CT head No acute abnormality. ASPECTS 10.  CTA head & neck  No LVO, focal moderate narrowing of the P2 segment of the right PCA. MRI no acute intracranial abnormality 2D Echo pending LDL 48 HgbA1c 5.7 VTE prophylaxis -Lovenox  No antithrombotic prior to admission, now on aspirin  81 mg daily and clopidogrel  75 mg daily for 3 weeks and then aspirin  81 mg alone. Therapy recommendations:  No follow up needed  Disposition:  8/15   Atrial fibrillation Discharge on 30-day monitor Follow-up outpatient  Symptomatic bradycardia Echo pending Consult cardiology   Dysphagia Patient has post-stroke dysphagia, SLP consulted    Diet   Diet Heart Room service appropriate? Yes; Fluid consistency: Thin   Advance diet as tolerated  Other Stroke Risk Factors Obesity, Body mass index is 31.39  kg/m., BMI >/= 30 associated with increased stroke risk, recommend weight loss, diet and exercise as appropriate  Coronary artery disease Obstructive sleep apnea  Other Active Problems CKD Stage IIIa History of non-hodgkin's lymphoma, in remission  Hospital day # 0  Adam Alvarado Maiden, MD PGY-1  I have personally obtained history,examined this patient, reviewed notes, independently viewed imaging studies, participated in medical decision making and plan of care.ROS completed by me personally and pertinent positives fully documented  I have made any additions or clarifications directly to the above note. Agree with note above. He presented with sudden onset left sided paresthesias which have persisted and suspect small right subcortical infarct not visualized on MRI. Recommend aspirin  and plavix  x 3 weeks followed by plavix  alone and aggressive risk factor modification.Consider eval foe sleep apnoea. Lond d/w pt and wife and Dr Briana   I personally spent a total of 50 minutes in  the care of the patient today including getting/reviewing separately obtained history, performing a medically appropriate exam/evaluation, counseling and educating, placing orders, referring and communicating with other health care professionals, documenting clinical information in the EHR, independently interpreting results, and coordinating care.         Eather Popp, MD Medical Director [facility] St[facility] Pager: [phone] 03/29/2024 7:00 AM   To contact Stroke Continuity provider, please refer to [email or web address]. After hours, contact General Neurology

## 2024-03-28 NOTE — Progress Notes (Signed)
 Patient mentioned to this RN that around 06:00am, his left arm and the left corner of his lip started to have a tingling and numbness sensation again. He said since 06:00am, this feeling continued and has not stopped. RN informed provider, Dr. Vanessa and Dr. Vira via secure chat. New orders were placed. Patient remains bed rest at this time.

## 2024-03-28 NOTE — Progress Notes (Signed)
 PROGRESS NOTE    Adam Alvarado  FMW:[id number] DOB: 02-14-1956 DOA: 03/27/2024 PCP: Sheryle Carwin, MD   Brief Narrative: Adam Alvarado is a 68 y.o. male with a history of gout and non-hodgkin's lymphoma.  Patient presented secondary to left sided weakness of upper and lower extremity, in addition to numbness of the left side of this face. Patient's presentation consistent with TIA. Code stroke called on arrival to the ED; imaging negative for acute stroke. During episode, patient was also noted to be severely bradycardic with heart rates in the 20s requiring atropine administration by EMS. Cardiology consulted and recommend outpatient heart monitor.   Assessment and Plan:  TIA Presumed diagnosis with symptoms of left sided upper/lower extremity weakness with associated facial numbness and hearing loss. Symptoms have resolved. Code stroke on admission with initial CT head significant for no intracranial abnormality. MRI confirms no stroke. CTA head and neck significant for no large vessel occlusion. Patient loaded with aspirin  650 mg and Plavix  300 mg. LDL of 48. Hemoglobin A1C of 5.7%. Transthoracic Echocardiogram significant pending. Neurology recommendations for aspirin  indefinitely and Plavix  for 3 weeks total. PT/OT recommendations for no PT/OT follow-up. -Follow-up Transthoracic Echocardiogram -Follow-up neurology recommendations -Continue Aspirin  and Plavix    Symptomatic bradycardia Noted per EMS with heart rate as low as 25 bpm. Blood pressure unable to be obtained at the time, so atropine was given. Unclear if bradycardia precipitated neurologic event or was secondary to neurologic event. Patient remains in sinus bradycardia without otherwise abnormal rhythm. Cardiology consulted and are recommending an outpatient cardiac monitor. -Follow-up Transthoracic Echocardiogram results   CKD stage IIIa Noted. Patient's creatinine appears to be stable at this time.   History of  non-hodgkin's lymphoma Previously followed by hematology/oncology, Dr. Cloretta. Patient in remission.    DVT prophylaxis: Lovenox  Code Status:   Code Status: Do not attempt resuscitation (DNR) PRE-ARREST INTERVENTIONS DESIRED Family Communication: None at bedside Disposition Plan: Discharge tomorrow pending Transthoracic Echocardiogram and final specialist recommendations   Consultants:  Neurology Cardiology  Procedures:  None  Antimicrobials: None    Subjective: Patient reports some ongoing numbness that has returned. Episode overnight with return of weakness, but that has resolved.  Objective: BP (!) 164/77 (BP Location: Right Arm)   Pulse (!) 51   Temp 98.1 F (36.7 C)   Resp 18   Ht 6' (1.829 m)   Wt 105 kg   SpO2 100%   BMI 31.39 kg/m   Examination:  General exam: Appears calm and comfortable Respiratory system: Clear to auscultation. Respiratory effort normal. Cardiovascular system: S1 & S2 heard, RRR. No murmurs, rubs, gallops or clicks. Gastrointestinal system: Abdomen is nondistended, soft and nontender. No organomegaly or masses felt. Normal bowel sounds heard. Central nervous system: Alert and oriented. No focal neurological deficits. Musculoskeletal: No edema. No calf tenderness Psychiatry: Judgement and insight appear normal. Mood & affect appropriate.    Data Reviewed: I have personally reviewed following labs and imaging studies  CBC Lab Results  Component Value Date   WBC 5.5 03/27/2024   RBC 4.61 03/27/2024   HGB 14.3 03/27/2024   HCT 42.0 03/27/2024   MCV 92.0 03/27/2024   MCH 30.4 03/27/2024   PLT 171 03/27/2024   MCHC 33.0 03/27/2024   RDW 13.8 03/27/2024   LYMPHSABS 1.5 03/27/2024   MONOABS 0.5 03/27/2024   EOSABS 0.1 03/27/2024   BASOSABS 0.0 03/27/2024     Last metabolic panel Lab Results  Component Value Date   NA 142 03/27/2024  K 4.3 03/27/2024   CL 108 03/27/2024   CO2 23 03/27/2024   BUN 17 03/27/2024    CREATININE 1.30 (H) 03/27/2024   GLUCOSE 128 (H) 03/27/2024   GFRNONAA 54 (L) 03/27/2024   GFRAA >90 12/19/2012   CALCIUM 8.3 (L) 03/27/2024   PROT 6.0 (L) 03/27/2024   ALBUMIN 3.5 03/27/2024   BILITOT 1.5 (H) 03/27/2024   ALKPHOS 66 03/27/2024   AST 26 03/27/2024   ALT 20 03/27/2024   ANIONGAP 10 03/27/2024    GFR: Estimated Creatinine Clearance: 69.1 mL/min (A) (by C-G formula based on SCr of 1.3 mg/dL (H)).  No results found for this or any previous visit (from the past 240 hours).    Radiology Studies: MR BRAIN WO CONTRAST Result Date: 03/27/2024 EXAM: MRI BRAIN WITHOUT CONTRAST 03/27/2024 10:21:14 AM TECHNIQUE: Multiplanar multisequence MRI of the head/brain was performed without the administration of intravenous contrast. COMPARISON: Same day CT head and CTA head and neck. CLINICAL HISTORY: Neuro deficit, acute, stroke suspected. Low heart rate in 40s. Transient left-sided numbness and weakness, and was also found to have bradycardia and hypotension with EMS. FINDINGS: BRAIN AND VENTRICLES: No acute infarct. No intracranial hemorrhage. No mass. No midline shift. No hydrocephalus. The sella is unremarkable. Normal flow voids. ORBITS: No acute abnormality. SINUSES AND MASTOIDS: Mild mucosal thickening in the ethmoid sinuses. No acute abnormality. BONES AND SOFT TISSUES: Normal marrow signal. No acute soft tissue abnormality. IMPRESSION: 1. No acute intracranial abnormality. Electronically signed by: Donnice Mania MD 03/27/2024 11:00 AM EDT RP Workstation: [id number]   CT ANGIO HEAD NECK W WO CM (CODE STROKE) Result Date: 03/27/2024 EXAM: CTA HEAD AND NECK WITH AND WITHOUT 03/27/2024 08:54:01 AM TECHNIQUE: CTA of the head and neck was performed with and without the administration of intravenous contrast. Multiplanar 2D and/or 3D reformatted images are provided for review. Automated exposure control, iterative reconstruction, and/or weight based adjustment of the mA/kV was utilized to  reduce the radiation dose to as low as reasonably achievable. Stenosis of the internal carotid arteries measured using NASCET criteria. COMPARISON: None available CLINICAL HISTORY: Neuro deficit, acute, stroke suspected. Left sided weakness and numbness. Code stroke. FINDINGS: CTA NECK: AORTIC ARCH AND ARCH VESSELS: Mild atherosclerosis of the proximal right subclavian artery without significant stenosis. CERVICAL CAROTID ARTERIES: The right cervical carotid artery is patent. Mild atherosclerosis at the right carotid bifurcation without hemodynamically significant stenosis. The left cervical carotid artery is patent. Mild atherosclerosis at the carotid bifurcation without hemodidymically significant stenosis. CERVICAL VERTEBRAL ARTERIES: No dissection, arterial injury, or significant stenosis. LUNGS AND MEDIASTINUM: Unremarkable. SOFT TISSUES: Ateal appearance of the upper thoracic esophagus. BONES: Degenerative changes in the cervical spine with disc space narrowing was pronounced at C5-6. CTA HEAD: ANTERIOR CIRCULATION: No significant stenosis of the internal carotid arteries. No significant stenosis of the anterior cerebral arteries. No significant stenosis of the middle cerebral arteries. No aneurysm. POSTERIOR CIRCULATION: There is focal moderate narrowing of the P2 segment of the right PCA. No significant stenosis of the basilar artery. No significant stenosis of the vertebral arteries. No aneurysm. OTHER: No dural venous sinus thrombosis on this non-dedicated study. IMPRESSION: 1. No large vessel occlusion. 2. Focal moderate narrowing of the P2 segment of the right PCA. 3. Mild atherosclerosis as above. Electronically signed by: Donnice Mania MD 03/27/2024 09:35 AM EDT RP Workstation: [id number]   CT HEAD CODE STROKE WO CONTRAST Result Date: 03/27/2024 EXAM: CT HEAD WITHOUT _study_datetime_ TECHNIQUE: CT of the head was performed without the administration of intravenous  contrast. Automated exposure  control, iterative reconstruction, and/or weight based adjustment of the mA/kV was utilized to reduce the radiation dose to as low as reasonably achievable. COMPARISON: None available. CLINICAL HISTORY: FINDINGS: BRAIN AND VENTRICLES: No acute intracranial hemorrhage. No mass effect or midline shift. No extra-axial fluid collection. Gray-white differentiation is maintained. No hydrocephalus. ORBITS: No acute abnormality. SINUSES AND MASTOIDS: Mild mucosal thickening in the ethmoid sinuses and right maxillary sinus. Mastoid air cells are clear. SOFT TISSUES AND SKULL: No acute skull fracture. No acute soft tissue abnormality. [place] Stroke Program Early CT (ASPECT) score: Ganglionic (caudate, internal capsule, lentiform nucleus, insula, M1-M3): 7 Supraganglionic (M4-M6): 3 Total: 10 IMPRESSION: 1. No acute intracranial abnormality. 2. Findings messaged to Dr. Lindzen via the [facility] messaging system at 9:08AM on 03/27/24. Electronically signed by: Donnice Mania MD 03/27/2024 09:09 AM EDT RP Workstation: [id number]      LOS: 0 days    Elgin Lam, MD [facility] 03/28/2024, 3:02 PM   If 7PM-7AM, please contact night-coverage [email or web address]

## 2024-03-28 NOTE — Hospital Course (Signed)
 Adam Alvarado is a 68 y.o. male with a history of gout and non-hodgkin's lymphoma.  Patient presented secondary to left sided weakness of upper and lower extremity, in addition to numbness of the left side of this face. Patient's presentation consistent with TIA. Code stroke called on arrival to the ED; imaging negative for acute stroke. During episode, patient was also noted to be severely bradycardic with heart rates in the 20s requiring atropine administration by EMS. Cardiology consulted and recommend outpatient heart monitor.

## 2024-03-28 NOTE — Progress Notes (Signed)
 Rounding Note   Patient Name: Adam Alvarado Date of Encounter: 03/28/2024  [facility] Cardiologist: None   Subjective Had questionable facial droop last night and transient left hand weakness again yesterday after admission.  Still with tingling on the left side.  No dizziness or presyncope.   Scheduled Meds:   stroke: early stages of recovery book   Does not apply Once   allopurinol   300 mg Oral Daily   aspirin  EC  81 mg Oral Daily   clopidogrel   75 mg Oral Daily   enoxaparin  (LOVENOX ) injection  50 mg Subcutaneous Q24H   pantoprazole   40 mg Oral Daily   Continuous Infusions:  sodium chloride  100 mL/hr at 03/28/24 0639   PRN Meds: acetaminophen  **OR** acetaminophen  (TYLENOL ) oral liquid 160 mg/5 mL **OR** acetaminophen , senna-docusate   Vital Signs  Vitals:   03/27/24 1140 03/27/24 2028 03/27/24 2332 03/28/24 0338  BP:  127/62 (!) 143/61 136/64  Pulse:  (!) 50 74 (!) 56  Resp:  18 18 18   Temp:  99.3 F (37.4 C) 98.1 F (36.7 C) 97.9 F (36.6 C)  TempSrc:  Oral  Oral  SpO2:  98% 98% 97%  Weight:      Height: 6' (1.829 m)       Intake/Output Summary (Last 24 hours) at 03/28/2024 0749 Last data filed at 03/28/2024 9360 Gross per 24 hour  Intake 816.24 ml  Output 200 ml  Net 616.24 ml      03/27/2024    8:00 AM 10/12/2022    7:32 AM 09/13/2022    8:26 AM  Last 3 Weights  Weight (lbs) 231 lb 7.7 oz 235 lb 235 lb  Weight (kg) 105 kg 106.595 kg 106.595 kg      Telemetry SB, no sustained pauses, no ectopy - Personally Reviewed  ECG  NA - Personally Reviewed  Physical Exam  GEN: No acute distress.   Neck: No JVD Cardiac: RRR, no murmurs, rubs, or gallops.  Respiratory: Clear to auscultation bilaterally. GI: Soft, nontender, non-distended  MS: No edema; No deformity. Neuro:  Nonfocal  Psych: Normal affect   Labs High Sensitivity Troponin:   Recent Labs  Lab 03/27/24 0842 03/27/24 1234  TROPONINIHS 9 8     Chemistry Recent Labs  Lab  03/27/24 0842 03/27/24 0845  NA 142 142  K 4.4 4.3  CL 109 108  CO2 23  --   GLUCOSE 134* 128*  BUN 14 17  CREATININE 1.43* 1.30*  CALCIUM 8.3*  --   MG 1.7  --   PROT 6.0*  --   ALBUMIN 3.5  --   AST 26  --   ALT 20  --   ALKPHOS 66  --   BILITOT 1.5*  --   GFRNONAA 54*  --   ANIONGAP 10  --     Lipids  Recent Labs  Lab 03/28/24 0247  CHOL 110  TRIG 118  HDL 38*  LDLCALC 48  CHOLHDL 2.9    Hematology Recent Labs  Lab 03/27/24 0842 03/27/24 0845  WBC 5.5  --   RBC 4.61  --   HGB 14.0 14.3  HCT 42.4 42.0  MCV 92.0  --   MCH 30.4  --   MCHC 33.0  --   RDW 13.8  --   PLT 171  --    Thyroid  No results for input(s): TSH, FREET4 in the last 168 hours.  BNPNo results for input(s): BNP, PROBNP in the last 168 hours.  DDimer No results for input(s): DDIMER in the last 168 hours.   Radiology  MR BRAIN WO CONTRAST Result Date: 03/27/2024 EXAM: MRI BRAIN WITHOUT CONTRAST 03/27/2024 10:21:14 AM TECHNIQUE: Multiplanar multisequence MRI of the head/brain was performed without the administration of intravenous contrast. COMPARISON: Same day CT head and CTA head and neck. CLINICAL HISTORY: Neuro deficit, acute, stroke suspected. Low heart rate in 40s. Transient left-sided numbness and weakness, and was also found to have bradycardia and hypotension with EMS. FINDINGS: BRAIN AND VENTRICLES: No acute infarct. No intracranial hemorrhage. No mass. No midline shift. No hydrocephalus. The sella is unremarkable. Normal flow voids. ORBITS: No acute abnormality. SINUSES AND MASTOIDS: Mild mucosal thickening in the ethmoid sinuses. No acute abnormality. BONES AND SOFT TISSUES: Normal marrow signal. No acute soft tissue abnormality. IMPRESSION: 1. No acute intracranial abnormality. Electronically signed by: Donnice Mania MD 03/27/2024 11:00 AM EDT RP Workstation: [id number]   CT ANGIO HEAD NECK W WO CM (CODE STROKE) Result Date: 03/27/2024 EXAM: CTA HEAD AND NECK WITH AND WITHOUT  03/27/2024 08:54:01 AM TECHNIQUE: CTA of the head and neck was performed with and without the administration of intravenous contrast. Multiplanar 2D and/or 3D reformatted images are provided for review. Automated exposure control, iterative reconstruction, and/or weight based adjustment of the mA/kV was utilized to reduce the radiation dose to as low as reasonably achievable. Stenosis of the internal carotid arteries measured using NASCET criteria. COMPARISON: None available CLINICAL HISTORY: Neuro deficit, acute, stroke suspected. Left sided weakness and numbness. Code stroke. FINDINGS: CTA NECK: AORTIC ARCH AND ARCH VESSELS: Mild atherosclerosis of the proximal right subclavian artery without significant stenosis. CERVICAL CAROTID ARTERIES: The right cervical carotid artery is patent. Mild atherosclerosis at the right carotid bifurcation without hemodynamically significant stenosis. The left cervical carotid artery is patent. Mild atherosclerosis at the carotid bifurcation without hemodidymically significant stenosis. CERVICAL VERTEBRAL ARTERIES: No dissection, arterial injury, or significant stenosis. LUNGS AND MEDIASTINUM: Unremarkable. SOFT TISSUES: Ateal appearance of the upper thoracic esophagus. BONES: Degenerative changes in the cervical spine with disc space narrowing was pronounced at C5-6. CTA HEAD: ANTERIOR CIRCULATION: No significant stenosis of the internal carotid arteries. No significant stenosis of the anterior cerebral arteries. No significant stenosis of the middle cerebral arteries. No aneurysm. POSTERIOR CIRCULATION: There is focal moderate narrowing of the P2 segment of the right PCA. No significant stenosis of the basilar artery. No significant stenosis of the vertebral arteries. No aneurysm. OTHER: No dural venous sinus thrombosis on this non-dedicated study. IMPRESSION: 1. No large vessel occlusion. 2. Focal moderate narrowing of the P2 segment of the right PCA. 3. Mild atherosclerosis as  above. Electronically signed by: Donnice Mania MD 03/27/2024 09:35 AM EDT RP Workstation: [id number]   CT HEAD CODE STROKE WO CONTRAST Result Date: 03/27/2024 EXAM: CT HEAD WITHOUT _study_datetime_ TECHNIQUE: CT of the head was performed without the administration of intravenous contrast. Automated exposure control, iterative reconstruction, and/or weight based adjustment of the mA/kV was utilized to reduce the radiation dose to as low as reasonably achievable. COMPARISON: None available. CLINICAL HISTORY: FINDINGS: BRAIN AND VENTRICLES: No acute intracranial hemorrhage. No mass effect or midline shift. No extra-axial fluid collection. Gray-white differentiation is maintained. No hydrocephalus. ORBITS: No acute abnormality. SINUSES AND MASTOIDS: Mild mucosal thickening in the ethmoid sinuses and right maxillary sinus. Mastoid air cells are clear. SOFT TISSUES AND SKULL: No acute skull fracture. No acute soft tissue abnormality. [place] Stroke Program Early CT (ASPECT) score: Ganglionic (caudate, internal capsule, lentiform nucleus, insula, M1-M3): 7 Supraganglionic (M4-M6):  3 Total: 10 IMPRESSION: 1. No acute intracranial abnormality. 2. Findings messaged to Dr. Lindzen via the [facility] messaging system at 9:08AM on 03/27/24. Electronically signed by: Donnice Mania MD 03/27/2024 09:09 AM EDT RP Workstation: [id number]    Cardiac Studies Echo pending.   Patient Profile   68 y.o. male without a cardiac history who is being seen today for the evaluation of bradycardia at the request of Dr. Avie.   Assessment & Plan  TIA:  Neurology saw late last evening.  Ongoing stuttering neurologic events.   Started on Plavix .     Will plan out patient monitor for four weeks and we will mail this to him.   Bradycardia:  Monitor as above.   TSH ordered.  I don't suspect this is the primary etiology of his neurologic complaints.  Possible vagal secondary to the acute symptoms yesterday rather than the cause.      For  questions or updates, please contact [facility] Please consult [email or web address] for contact info under     Signed, Lynwood Schilling, MD  03/28/2024, 7:49 AM

## 2024-03-28 NOTE — Progress Notes (Signed)
 Transition of Care [facility]) - Inpatient Brief Assessment   Patient Details  Name: Adam Alvarado MRN: [id number] Date of Birth: [birth year]  [facility] of [facility] CM/SW Contact:    Rosaline JONELLE Joe, RN Phone Number: 03/28/2024, 3:00 PM   Clinical Narrative: Patient admitted from home with S/S of TIA which is since resolved per notes.  Patient lives with family at the home.  Patient was seen by PT/OT with no needs.  No IP Care management needs at this time.   Transition of Care Asessment: Insurance and Status: (P) Insurance coverage has been reviewed Patient has primary care physician: (P) Yes Home environment has been reviewed: (P) from home with spouse Prior level of function:: (P) self Prior/Current Home Services: (P) No current home services Social Drivers of Health Review: (P) SDOH reviewed no interventions necessary   Transition of care needs: (P) no transition of care needs at this time

## 2024-03-28 NOTE — Plan of Care (Signed)

## 2024-03-28 NOTE — Discharge Summary (Signed)
 Physician Discharge Summary   Patient: Adam Alvarado MRN: [id number] DOB: [birth year]  Admit date:     03/27/2024  Discharge date: 03/29/24  Discharge Physician: Elgin Lam, MD   PCP: Sheryle Carwin, MD   Recommendations at discharge:  PCP visit for hospital follow-up Cardiology and neurology visits for hospital follow-up Outpatient heart monitor Transthoracic Echocardiogram results  Discharge Diagnoses: Principal Problem:   TIA (transient ischemic attack) Active Problems:   Sinus bradycardia  Resolved Problems:   * No resolved hospital problems. [name] Course: Adam Alvarado is a 68 y.o. male with a history of gout and non-hodgkin's lymphoma.  Patient presented secondary to left sided weakness of upper and lower extremity, in addition to numbness of the left side of this face. Patient's presentation consistent with TIA. Code stroke called on arrival to the ED; imaging negative for acute stroke. During episode, patient was also noted to be severely bradycardic with heart rates in the 20s requiring atropine administration by EMS. Cardiology consulted and recommend outpatient heart monitor.  Assessment and Plan:  TIA Presumed diagnosis with symptoms of left sided upper/lower extremity weakness with associated facial numbness and hearing loss. Symptoms have resolved. Code stroke on admission with initial CT head significant for no intracranial abnormality. MRI confirms no stroke. CTA head and neck significant for no large vessel occlusion. Patient loaded with aspirin  650 mg and Plavix  300 mg. LDL of 48. Hemoglobin A1C of 5.7%. Transthoracic Echocardiogram pending on discharge. Neurology recommendations for aspirin  indefinitely and Plavix  for 3 weeks total. PT/OT recommendations for no PT/OT follow-up.   Symptomatic bradycardia Noted per EMS with heart rate as low as 25 bpm. Blood pressure unable to be obtained at the time, so atropine was given. Unclear if bradycardia precipitated  neurologic event or was secondary to neurologic event. Patient remains in sinus bradycardia without otherwise abnormal rhythm. Cardiology consulted and are recommending an outpatient cardiac monitor. Patient to follow-up with cardiology as an outpatient.   CKD stage IIIa Noted. Patient's creatinine appears to be stable at this time.   History of non-hodgkin's lymphoma Previously followed by hematology/oncology, Dr. Cloretta. Patient in remission.  GERD Will switch to Protonix  while patient is on Plavix . Resume Nexium  once completed Plavix .   Consultants:  Neurology Cardiology   Procedures:  Transthoracic Echocardiogram   Disposition: Home Diet recommendation: Cardiac diet   DISCHARGE MEDICATION: Allergies as of 03/29/2024   No Known Allergies      Medication List     PAUSE taking these medications    esomeprazole  40 MG capsule Wait to take this until: April 17, 2024 Commonly known as: NexIUM  Take 1 capsule (40 mg total) by mouth daily at 12 noon.       STOP taking these medications    HYDROcodone  bit-homatropine 5-1.5 MG/5ML syrup Commonly known as: Hycodan   ondansetron  4 MG disintegrating tablet Commonly known as: ZOFRAN -ODT       TAKE these medications    albuterol  108 (90 Base) MCG/ACT inhaler Commonly known as: VENTOLIN  HFA Inhale 2 puffs into the lungs every 6 (six) hours as needed.   allopurinol  300 MG tablet Commonly known as: ZYLOPRIM  Take 300 mg by mouth daily.   aspirin  EC 81 MG tablet Take 1 tablet (81 mg total) by mouth daily. Swallow whole. Start taking on: March 30, 2024   clopidogrel  75 MG tablet Commonly known as: PLAVIX  Take 1 tablet (75 mg total) by mouth daily for 18 days. Start taking on: March 30, 2024  colchicine  0.6 MG tablet Take 1.2 mg at the first sign of a gout attack followed by 0.6 mg in 1 hour, 0.6 mg 8 hours if needed. Next day 1.2 mg twice daily as needed until flare resolves.   pantoprazole  40 MG  tablet Commonly known as: Protonix  Take 1 tablet (40 mg total) by mouth daily for 18 days. Start taking on: March 30, 2024        Discharge Exam: BP (!) 151/76 (BP Location: Left Arm)   Pulse (!) 49   Temp 98.4 F (36.9 C)   Resp 18   Ht 6' (1.829 m)   Wt 105 kg   SpO2 97%   BMI 31.39 kg/m   General exam: Appears calm and comfortable Respiratory system: Respiratory effort normal. Central nervous system: Alert and oriented. Psychiatry: Judgement and insight appear normal. Mood & affect appropriate.   Condition at discharge: stable  The results of significant diagnostics from this hospitalization (including imaging, microbiology, ancillary and laboratory) are listed below for reference.   Imaging Studies: MR BRAIN WO CONTRAST Result Date: 03/27/2024 EXAM: MRI BRAIN WITHOUT CONTRAST 03/27/2024 10:21:14 AM TECHNIQUE: Multiplanar multisequence MRI of the head/brain was performed without the administration of intravenous contrast. COMPARISON: Same day CT head and CTA head and neck. CLINICAL HISTORY: Neuro deficit, acute, stroke suspected. Low heart rate in 40s. Transient left-sided numbness and weakness, and was also found to have bradycardia and hypotension with EMS. FINDINGS: BRAIN AND VENTRICLES: No acute infarct. No intracranial hemorrhage. No mass. No midline shift. No hydrocephalus. The sella is unremarkable. Normal flow voids. ORBITS: No acute abnormality. SINUSES AND MASTOIDS: Mild mucosal thickening in the ethmoid sinuses. No acute abnormality. BONES AND SOFT TISSUES: Normal marrow signal. No acute soft tissue abnormality. IMPRESSION: 1. No acute intracranial abnormality. Electronically signed by: Donnice Mania MD 03/27/2024 11:00 AM EDT RP Workstation: [id number]   CT ANGIO HEAD NECK W WO CM (CODE STROKE) Result Date: 03/27/2024 EXAM: CTA HEAD AND NECK WITH AND WITHOUT 03/27/2024 08:54:01 AM TECHNIQUE: CTA of the head and neck was performed with and without the administration  of intravenous contrast. Multiplanar 2D and/or 3D reformatted images are provided for review. Automated exposure control, iterative reconstruction, and/or weight based adjustment of the mA/kV was utilized to reduce the radiation dose to as low as reasonably achievable. Stenosis of the internal carotid arteries measured using NASCET criteria. COMPARISON: None available CLINICAL HISTORY: Neuro deficit, acute, stroke suspected. Left sided weakness and numbness. Code stroke. FINDINGS: CTA NECK: AORTIC ARCH AND ARCH VESSELS: Mild atherosclerosis of the proximal right subclavian artery without significant stenosis. CERVICAL CAROTID ARTERIES: The right cervical carotid artery is patent. Mild atherosclerosis at the right carotid bifurcation without hemodynamically significant stenosis. The left cervical carotid artery is patent. Mild atherosclerosis at the carotid bifurcation without hemodidymically significant stenosis. CERVICAL VERTEBRAL ARTERIES: No dissection, arterial injury, or significant stenosis. LUNGS AND MEDIASTINUM: Unremarkable. SOFT TISSUES: Ateal appearance of the upper thoracic esophagus. BONES: Degenerative changes in the cervical spine with disc space narrowing was pronounced at C5-6. CTA HEAD: ANTERIOR CIRCULATION: No significant stenosis of the internal carotid arteries. No significant stenosis of the anterior cerebral arteries. No significant stenosis of the middle cerebral arteries. No aneurysm. POSTERIOR CIRCULATION: There is focal moderate narrowing of the P2 segment of the right PCA. No significant stenosis of the basilar artery. No significant stenosis of the vertebral arteries. No aneurysm. OTHER: No dural venous sinus thrombosis on this non-dedicated study. IMPRESSION: 1. No large vessel occlusion. 2. Focal moderate narrowing  of the P2 segment of the right PCA. 3. Mild atherosclerosis as above. Electronically signed by: Donnice Mania MD 03/27/2024 09:35 AM EDT RP Workstation: [id number]   CT  HEAD CODE STROKE WO CONTRAST Result Date: 03/27/2024 EXAM: CT HEAD WITHOUT _study_datetime_ TECHNIQUE: CT of the head was performed without the administration of intravenous contrast. Automated exposure control, iterative reconstruction, and/or weight based adjustment of the mA/kV was utilized to reduce the radiation dose to as low as reasonably achievable. COMPARISON: None available. CLINICAL HISTORY: FINDINGS: BRAIN AND VENTRICLES: No acute intracranial hemorrhage. No mass effect or midline shift. No extra-axial fluid collection. Gray-white differentiation is maintained. No hydrocephalus. ORBITS: No acute abnormality. SINUSES AND MASTOIDS: Mild mucosal thickening in the ethmoid sinuses and right maxillary sinus. Mastoid air cells are clear. SOFT TISSUES AND SKULL: No acute skull fracture. No acute soft tissue abnormality. [place] Stroke Program Early CT (ASPECT) score: Ganglionic (caudate, internal capsule, lentiform nucleus, insula, M1-M3): 7 Supraganglionic (M4-M6): 3 Total: 10 IMPRESSION: 1. No acute intracranial abnormality. 2. Findings messaged to Dr. Lindzen via the [facility] messaging system at 9:08AM on 03/27/24. Electronically signed by: Donnice Mania MD 03/27/2024 09:09 AM EDT RP Workstation: [id number]    Microbiology: Results for orders placed or performed during the hospital encounter of 12/17/12  Urine culture     Status: None   Collection Time: 12/17/12  9:53 PM   Specimen: Urine, Clean Catch  Result Value Ref Range Status   Specimen Description URINE, CLEAN CATCH  Final   Special Requests NONE  Final   Culture  Setup Time 12/18/2012 04:07  Final   Colony Count NO GROWTH  Final   Culture NO GROWTH  Final   Report Status 12/19/2012 FINAL  Final  Blood culture (routine x 2)     Status: None   Collection Time: 12/17/12 11:56 PM   Specimen: Blood  Result Value Ref Range Status   Specimen Description BLOOD LEFT ARM  Final   Special Requests BOTTLES DRAWN AEROBIC AND ANAEROBIC 5CC  Final    Culture  Setup Time 12/18/2012 03:50  Final   Culture NO GROWTH 5 DAYS  Final   Report Status 12/24/2012 FINAL  Final  Blood culture (routine x 2)     Status: None   Collection Time: 12/18/12 12:00 AM   Specimen: Blood  Result Value Ref Range Status   Specimen Description BLOOD RIGHT PIC  Final   Special Requests BOTTLES DRAWN AEROBIC AND ANAEROBIC 5CC  Final   Culture  Setup Time 12/18/2012 03:50  Final   Culture NO GROWTH 5 DAYS  Final   Report Status 12/24/2012 FINAL  Final  MRSA PCR Screening     Status: None   Collection Time: 12/18/12  8:35 AM   Specimen: Nasal Mucosa; Nasopharyngeal  Result Value Ref Range Status   MRSA by PCR NEGATIVE NEGATIVE Final    Comment:        The GeneXpert MRSA Assay (FDA approved for NASAL specimens only), is one component of a comprehensive MRSA colonization surveillance program. It is not intended to diagnose MRSA infection nor to guide or monitor treatment for MRSA infections.    Labs: CBC: Recent Labs  Lab 03/27/24 0842 03/27/24 0845  WBC 5.5  --   NEUTROABS 3.3  --   HGB 14.0 14.3  HCT 42.4 42.0  MCV 92.0  --   PLT 171  --    Basic Metabolic Panel: Recent Labs  Lab 03/27/24 0842 03/27/24 0845  NA 142 142  K  4.4 4.3  CL 109 108  CO2 23  --   GLUCOSE 134* 128*  BUN 14 17  CREATININE 1.43* 1.30*  CALCIUM 8.3*  --   MG 1.7  --    Liver Function Tests: Recent Labs  Lab 03/27/24 0842  AST 26  ALT 20  ALKPHOS 66  BILITOT 1.5*  PROT 6.0*  ALBUMIN 3.5   CBG: Recent Labs  Lab 03/27/24 0841  GLUCAP 125*    Discharge time spent: 35 minutes.  Signed: Elgin Lam, MD [facility] 03/29/2024

## 2024-03-28 NOTE — Progress Notes (Signed)
 SLP Cancellation Note  Patient Details Name: Adam Alvarado MRN: [id number] DOB: 1956-04-18   Cancelled treatment:       Reason Eval/Treat Not Completed: SLP screened, no needs identified, will sign off   Persia Lintner, Consuelo Fitch 03/28/2024, 8:11 AM

## 2024-03-28 NOTE — Progress Notes (Signed)
 Ordering 30 day monitor for TIA. Dr. Lavona to read

## 2024-03-28 NOTE — Plan of Care (Signed)

## 2024-03-29 ENCOUNTER — Observation Stay (HOSPITAL_BASED_OUTPATIENT_CLINIC_OR_DEPARTMENT_OTHER)

## 2024-03-29 DIAGNOSIS — I69391 Dysphagia following cerebral infarction: Secondary | ICD-10-CM | POA: Diagnosis not present

## 2024-03-29 DIAGNOSIS — I639 Cerebral infarction, unspecified: Secondary | ICD-10-CM | POA: Diagnosis not present

## 2024-03-29 DIAGNOSIS — G459 Transient cerebral ischemic attack, unspecified: Secondary | ICD-10-CM

## 2024-03-29 DIAGNOSIS — R29702 NIHSS score 2: Secondary | ICD-10-CM

## 2024-03-29 LAB — ECHOCARDIOGRAM COMPLETE
AR max vel: 2.94 cm2
AV Area VTI: 3.11 cm2
AV Area mean vel: 2.95 cm2
AV Mean grad: 3 mmHg
AV Peak grad: 4.7 mmHg
Ao pk vel: 1.08 m/s
Area-P 1/2: 3.53 cm2
Height: 72 in
S' Lateral: 3.24 cm
Weight: 3703.73 [oz_av]

## 2024-03-29 MED ORDER — PANTOPRAZOLE SODIUM 40 MG PO TBEC: 40.0000 mg | DELAYED_RELEASE_TABLET | Freq: Every day | ORAL | 0 refills | Status: DC

## 2024-03-29 MED ORDER — ASPIRIN 81 MG PO TBEC: 81.0000 mg | DELAYED_RELEASE_TABLET | Freq: Every day | ORAL | 0 refills | Status: AC

## 2024-03-29 MED ORDER — CLOPIDOGREL BISULFATE 75 MG PO TABS: 75.0000 mg | ORAL_TABLET | Freq: Every day | ORAL | 0 refills | Status: AC

## 2024-03-29 NOTE — Progress Notes (Signed)
 STROKE TEAM PROGRESS NOTE   SUBJECTIVE (INTERIM HISTORY)  Wife is at bedside.  Patient states he is feeling better though still has mild residual left-sided paresthesias.  Echocardiogram showed ejection fraction 60 to 65%.  Left atrial size normal.  He is ready for discharge. OBJECTIVE  CBC    Component Value Date/Time   WBC 5.5 03/27/2024 0842   RBC 4.61 03/27/2024 0842   HGB 14.3 03/27/2024 0845   HGB 15.1 01/09/2014 0943   HCT 42.0 03/27/2024 0845   HCT 45.9 01/09/2014 0943   PLT 171 03/27/2024 0842   PLT 199 01/09/2014 0943   MCV 92.0 03/27/2024 0842   MCV 89.5 01/09/2014 0943   MCH 30.4 03/27/2024 0842   MCHC 33.0 03/27/2024 0842   RDW 13.8 03/27/2024 0842   RDW 13.7 01/09/2014 0943   LYMPHSABS 1.5 03/27/2024 0842   LYMPHSABS 1.0 01/09/2014 0943   MONOABS 0.5 03/27/2024 0842   MONOABS 0.5 01/09/2014 0943   EOSABS 0.1 03/27/2024 0842   EOSABS 0.1 01/09/2014 0943   BASOSABS 0.0 03/27/2024 0842   BASOSABS 0.0 01/09/2014 0943    BMET    Component Value Date/Time   NA 142 03/27/2024 0845   NA 143 01/09/2014 0943   K 4.3 03/27/2024 0845   K 4.3 01/09/2014 0943   CL 108 03/27/2024 0845   CL 108 (H) 01/09/2013 0911   CO2 23 03/27/2024 0842   CO2 22 01/09/2014 0943   GLUCOSE 128 (H) 03/27/2024 0845   GLUCOSE 95 01/09/2014 0943   GLUCOSE 134 (H) 01/09/2013 0911   BUN 17 03/27/2024 0845   BUN 16.7 01/09/2014 0943   CREATININE 1.30 (H) 03/27/2024 0845   CREATININE 1.3 01/09/2014 0943   CALCIUM 8.3 (L) 03/27/2024 0842   CALCIUM 9.1 01/09/2014 0943   GFRNONAA 54 (L) 03/27/2024 0842    IMAGING past 24 hours ECHOCARDIOGRAM COMPLETE Result Date: 03/29/2024    ECHOCARDIOGRAM REPORT   Patient Name:   Adam Alvarado Date of Exam: 03/29/2024 Medical Rec #:  [id number]       Height:       72.0 in Accession #:    [id number]      Weight:       231.5 lb Date of Birth:  11/03/1955       BSA:          2.267 m Patient Age:    68 years        BP:           151/76 mmHg Patient Gender:  M               HR:           54 bpm. Exam Location:  Inpatient Procedure: 2D Echo, Cardiac Doppler and Color Doppler (Both Spectral and Color            Flow Doppler were utilized during procedure). Indications:    Stroke  History:        Patient has prior history of Echocardiogram examinations, most                 recent 12/19/2012. TIA.  Sonographer:    Benard Stallion Referring Phys: [phone] RALPH A NETTEY IMPRESSIONS  1. Left ventricular ejection fraction, by estimation, is 60 to 65%. The left ventricle has normal function. The left ventricle has no regional wall motion abnormalities. Left ventricular diastolic parameters were normal.  2. Right ventricular systolic function is normal. The right ventricular size is normal.  3.  The mitral valve is normal in structure. Trivial mitral valve regurgitation.  4. The aortic valve is tricuspid. Aortic valve regurgitation is not visualized. Aortic valve sclerosis is present, with no evidence of aortic valve stenosis. FINDINGS  Left Ventricle: Left ventricular ejection fraction, by estimation, is 60 to 65%. The left ventricle has normal function. The left ventricle has no regional wall motion abnormalities. The left ventricular internal cavity size was normal in size. There is  no left ventricular hypertrophy. Left ventricular diastolic parameters were normal. Right Ventricle: The right ventricular size is normal. No increase in right ventricular wall thickness. Right ventricular systolic function is normal. Left Atrium: Left atrial size was normal in size. Right Atrium: Right atrial size was normal in size. Pericardium: There is no evidence of pericardial effusion. Mitral Valve: The mitral valve is normal in structure. Trivial mitral valve regurgitation. Tricuspid Valve: The tricuspid valve is normal in structure. Tricuspid valve regurgitation is trivial. Aortic Valve: The aortic valve is tricuspid. Aortic valve regurgitation is not visualized. Aortic valve sclerosis is  present, with no evidence of aortic valve stenosis. Aortic valve mean gradient measures 3.0 mmHg. Aortic valve peak gradient measures 4.7  mmHg. Aortic valve area, by VTI measures 3.11 cm. Pulmonic Valve: The pulmonic valve was normal in structure. Pulmonic valve regurgitation is mild. Aorta: The aortic root and ascending aorta are structurally normal, with no evidence of dilitation. IAS/Shunts: No atrial level shunt detected by color flow Doppler.  LEFT VENTRICLE PLAX 2D LVIDd:         4.94 cm   Diastology LVIDs:         3.24 cm   LV e' medial:    9.68 cm/s LV PW:         1.05 cm   LV E/e' medial:  7.3 LV IVS:        1.08 cm   LV e' lateral:   9.90 cm/s LVOT diam:     2.26 cm   LV E/e' lateral: 7.1 LV SV:         74 LV SV Index:   33 LVOT Area:     4.01 cm  RIGHT VENTRICLE RV Basal diam:  4.36 cm RV Mid diam:    3.80 cm RV S prime:     17.10 cm/s TAPSE (M-mode): 3.1 cm LEFT ATRIUM             Index        RIGHT ATRIUM           Index LA diam:        4.18 cm 1.84 cm/m   RA Area:     24.30 cm LA Vol (A2C):   67.5 ml 29.77 ml/m  RA Volume:   71.50 ml  31.54 ml/m LA Vol (A4C):   66.4 ml 29.29 ml/m LA Biplane Vol: 69.0 ml 30.43 ml/m  AORTIC VALVE AV Area (Vmax):    2.94 cm AV Area (Vmean):   2.95 cm AV Area (VTI):     3.11 cm AV Vmax:           108.00 cm/s AV Vmean:          72.700 cm/s AV VTI:            0.237 m AV Peak Grad:      4.7 mmHg AV Mean Grad:      3.0 mmHg LVOT Vmax:         79.10 cm/s LVOT Vmean:  53.500 cm/s LVOT VTI:          0.184 m LVOT/AV VTI ratio: 0.78  AORTA Ao Root diam: 3.03 cm Ao Asc diam:  3.21 cm MITRAL VALVE               TRICUSPID VALVE MV Area (PHT): 3.53 cm    TR Peak grad:   12.1 mmHg MV Decel Time: 215 msec    TR Vmax:        174.00 cm/s MV E velocity: 70.20 cm/s MV A velocity: 83.80 cm/s  SHUNTS MV E/A ratio:  0.84        Systemic VTI:  0.18 m                            Systemic Diam: 2.26 cm Vina Gull MD Electronically signed by Vina Gull MD Signature Date/Time:  03/29/2024/1:35:14 PM    Final     Vitals:   03/28/24 1954 03/29/24 0042 03/29/24 0607 03/29/24 0748  BP: (!) 165/69 (!) 166/68 (!) 157/69 (!) 151/76  Pulse: (!) 59 (!) 58 (!) 52 (!) 49  Resp: 18 18 18    Temp: 98.5 F (36.9 C) 98.4 F (36.9 C) 98.7 F (37.1 C) 98.4 F (36.9 C)  TempSrc:      SpO2: 98% 97% 99% 97%  Weight:      Height:         PHYSICAL EXAM General:  Alert, well-nourished, well-developed patient in no acute distress CV: Regular rhythm and bradycardic on monitor Respiratory:  Regular, unlabored respirations on room air   NEURO:  Mental Status: AA&Ox3, patient is able to give clear and coherent history Speech/Language: speech is without dysarthria or aphasia.  Naming, repetition, fluency, and comprehension intact.  Cranial Nerves:  II: PERRL. Visual fields full.  III, IV, VI: EOMI. Eyelids elevate symmetrically.  V: Sensation is intact to light touch and symmetrical to face.  VII: Face is symmetrical resting and smiling VIII: hearing intact to voice. IX, X: Palate elevates symmetrically. Phonation is normal.  KP:Dynloizm shrug 5/5. XII: tongue is midline without fasciculations. Motor: 5/5 strength to all muscle groups tested.  Tone: is normal and bulk is normal Sensation- Intact to light touch bilaterally. Extinction absent to light touch to DSS. Reports subtle tingling in left fingertips and around mouth Coordination: FTN intact bilaterally, HKS: no ataxia in BLE.No drift.  Gait- deferred  Most Recent NIH  Dizziness Present: No (08/15 0900) Headache Present: No (08/15 0900) Interval: Shift assessment (08/15 0900) Level of Consciousness (1a.)   : Alert, keenly responsive (08/15 0900) LOC Questions (1b. )   : Answers both questions correctly (08/15 0900) LOC Commands (1c. )   : Performs both tasks correctly (08/15 0900) Best Gaze (2. )  : Normal (08/15 0900) Visual (3. )  : No visual loss (08/15 0900) Facial Palsy (4. )    : Minor paralysis (08/15  0900) Motor Arm, Left (5a. )   : No drift (08/15 0900) Motor Arm, Right (5b. ) : No drift (08/15 0900) Motor Leg, Left (6a. )  : No drift (08/15 0900) Motor Leg, Right (6b. ) : No drift (08/15 0900) Limb Ataxia (7. ): Absent (08/15 0900) Sensory (8. )  : Mild-to-moderate sensory loss, patient feels pinprick is less sharp or is dull on the affected side, or there is a loss of superficial pain with pinprick, but patient is aware of being touched (08/15 0900) Best Language (9. )  :  No aphasia (08/15 0900) Dysarthria (10. ): Normal (08/15 0900) Extinction/Inattention (11.)   : No Abnormality (08/15 0900) Complete NIHSS TOTAL: 2 (08/15 0900)  ASSESSMENT/PLAN Mr. Adam Spradley. Alvarado is a 68 year old male with a history of gout and non-Hodgkin's lymphoma, admitted for acute onset left-sided numbness, weakness, and diaphoresis. Symptoms resolved within one hour en route to the hospital. NIHSS on admission was 0. Neurological examination is normal except for persistent circumoral, L hand, and L foot tingling. Despite normal neuroimaging, differential diagnosis includes stroke based on clinical assessment versus TIA. Patient will be discharged on 30 day cardiac monitor. Plan is to discharge tomorrow pending echo.   Acute Ischemic Infarct: Stroke determined by clinical assessment vs TIA Etiology:  Unknown, possible cardiac etiology Code Stroke CT head No acute abnormality. ASPECTS 10.  CTA head & neck  No LVO, focal moderate narrowing of the P2 segment of the right PCA. MRI no acute intracranial abnormality 2D Echo EF 60 to 65%.  Left atrial size normal.   LDL 48 HgbA1c 5.7 VTE prophylaxis -Lovenox  No antithrombotic prior to admission, now on aspirin  81 mg daily and clopidogrel  75 mg daily for 3 weeks and then aspirin  81 mg alone. Therapy recommendations:  No follow up needed  Disposition:  8/15   Atrial fibrillation Discharge on 30-day monitor Follow-up outpatient  Symptomatic bradycardia Echo  pending Consult cardiology   Dysphagia Patient has post-stroke dysphagia, SLP consulted    Diet   Diet Heart Room service appropriate? Yes; Fluid consistency: Thin   Advance diet as tolerated  Other Stroke Risk Factors Obesity, Body mass index is 31.39 kg/m., BMI >/= 30 associated with increased stroke risk, recommend weight loss, diet and exercise as appropriate  Coronary artery disease Obstructive sleep apnea  Other Active Problems CKD Stage IIIa History of non-hodgkin's lymphoma, in remission  Hospital day # 0   He presented with sudden onset left sided paresthesias which have persisted and suspect small right subcortical infarct not visualized on MRI. Recommend aspirin  and plavix  x 3 weeks followed by plavix  alone and aggressive risk factor modification.Consider  outpt eval for sleep apnoea. Lond d/w pt and wife and Dr Briana Eather Popp, MD Medical Director [facility] Pager: [phone] 03/29/2024 2:22 PM   To contact Stroke Continuity provider, please refer to [email or web address]. After hours, contact General Neurology

## 2024-03-29 NOTE — Progress Notes (Signed)
 Progress Note  Patient Name: Adam Alvarado Date of Encounter: 03/29/2024  Primary Cardiologist:   Lynwood Schilling, MD   Subjective   Still with tingling in the left hand.  No chest pain.  No SOB. No presyncope or syncope.   Inpatient Medications    Scheduled Meds:  allopurinol   300 mg Oral Daily   aspirin  EC  81 mg Oral Daily   clopidogrel   75 mg Oral Daily   enoxaparin  (LOVENOX ) injection  50 mg Subcutaneous Q24H   pantoprazole   40 mg Oral Daily   Continuous Infusions:  PRN Meds: acetaminophen  **OR** acetaminophen  (TYLENOL ) oral liquid 160 mg/5 mL **OR** acetaminophen , senna-docusate   Vital Signs    Vitals:   03/28/24 1954 03/29/24 0042 03/29/24 0607 03/29/24 0748  BP: (!) 165/69 (!) 166/68 (!) 157/69 (!) 151/76  Pulse: (!) 59 (!) 58 (!) 52 (!) 49  Resp: 18 18 18    Temp: 98.5 F (36.9 C) 98.4 F (36.9 C) 98.7 F (37.1 C) 98.4 F (36.9 C)  TempSrc:      SpO2: 98% 97% 99% 97%  Weight:      Height:       No intake or output data in the 24 hours ending 03/29/24 0813 Filed Weights   03/27/24 0800  Weight: 105 kg    Telemetry    NSR - Personally Reviewed  ECG    NA - Personally Reviewed  Physical Exam   GEN: No acute distress.   Neck: No  JVD Cardiac: RRR, no murmurs, rubs, or gallops.  Respiratory: Clear  to auscultation bilaterally. GI: Soft, nontender, non-distended  MS: No  edema; No deformity. Neuro:  Nonfocal  Psych: Normal affect   Labs    Chemistry Recent Labs  Lab 03/27/24 0842 03/27/24 0845  NA 142 142  K 4.4 4.3  CL 109 108  CO2 23  --   GLUCOSE 134* 128*  BUN 14 17  CREATININE 1.43* 1.30*  CALCIUM 8.3*  --   PROT 6.0*  --   ALBUMIN 3.5  --   AST 26  --   ALT 20  --   ALKPHOS 66  --   BILITOT 1.5*  --   GFRNONAA 54*  --   ANIONGAP 10  --      Hematology Recent Labs  Lab 03/27/24 0842 03/27/24 0845  WBC 5.5  --   RBC 4.61  --   HGB 14.0 14.3  HCT 42.4 42.0  MCV 92.0  --   MCH 30.4  --   MCHC 33.0  --    RDW 13.8  --   PLT 171  --     Cardiac EnzymesNo results for input(s): TROPONINI in the last 168 hours. No results for input(s): TROPIPOC in the last 168 hours.   BNPNo results for input(s): BNP, PROBNP in the last 168 hours.   DDimer No results for input(s): DDIMER in the last 168 hours.   Radiology    MR BRAIN WO CONTRAST Result Date: 03/27/2024 EXAM: MRI BRAIN WITHOUT CONTRAST 03/27/2024 10:21:14 AM TECHNIQUE: Multiplanar multisequence MRI of the head/brain was performed without the administration of intravenous contrast. COMPARISON: Same day CT head and CTA head and neck. CLINICAL HISTORY: Neuro deficit, acute, stroke suspected. Low heart rate in 40s. Transient left-sided numbness and weakness, and was also found to have bradycardia and hypotension with EMS. FINDINGS: BRAIN AND VENTRICLES: No acute infarct. No intracranial hemorrhage. No mass. No midline shift. No hydrocephalus. The sella is unremarkable.  Normal flow voids. ORBITS: No acute abnormality. SINUSES AND MASTOIDS: Mild mucosal thickening in the ethmoid sinuses. No acute abnormality. BONES AND SOFT TISSUES: Normal marrow signal. No acute soft tissue abnormality. IMPRESSION: 1. No acute intracranial abnormality. Electronically signed by: Donnice Mania MD 03/27/2024 11:00 AM EDT RP Workstation: [id number]   CT ANGIO HEAD NECK W WO CM (CODE STROKE) Result Date: 03/27/2024 EXAM: CTA HEAD AND NECK WITH AND WITHOUT 03/27/2024 08:54:01 AM TECHNIQUE: CTA of the head and neck was performed with and without the administration of intravenous contrast. Multiplanar 2D and/or 3D reformatted images are provided for review. Automated exposure control, iterative reconstruction, and/or weight based adjustment of the mA/kV was utilized to reduce the radiation dose to as low as reasonably achievable. Stenosis of the internal carotid arteries measured using NASCET criteria. COMPARISON: None available CLINICAL HISTORY: Neuro deficit, acute, stroke  suspected. Left sided weakness and numbness. Code stroke. FINDINGS: CTA NECK: AORTIC ARCH AND ARCH VESSELS: Mild atherosclerosis of the proximal right subclavian artery without significant stenosis. CERVICAL CAROTID ARTERIES: The right cervical carotid artery is patent. Mild atherosclerosis at the right carotid bifurcation without hemodynamically significant stenosis. The left cervical carotid artery is patent. Mild atherosclerosis at the carotid bifurcation without hemodidymically significant stenosis. CERVICAL VERTEBRAL ARTERIES: No dissection, arterial injury, or significant stenosis. LUNGS AND MEDIASTINUM: Unremarkable. SOFT TISSUES: Ateal appearance of the upper thoracic esophagus. BONES: Degenerative changes in the cervical spine with disc space narrowing was pronounced at C5-6. CTA HEAD: ANTERIOR CIRCULATION: No significant stenosis of the internal carotid arteries. No significant stenosis of the anterior cerebral arteries. No significant stenosis of the middle cerebral arteries. No aneurysm. POSTERIOR CIRCULATION: There is focal moderate narrowing of the P2 segment of the right PCA. No significant stenosis of the basilar artery. No significant stenosis of the vertebral arteries. No aneurysm. OTHER: No dural venous sinus thrombosis on this non-dedicated study. IMPRESSION: 1. No large vessel occlusion. 2. Focal moderate narrowing of the P2 segment of the right PCA. 3. Mild atherosclerosis as above. Electronically signed by: Donnice Mania MD 03/27/2024 09:35 AM EDT RP Workstation: [id number]   CT HEAD CODE STROKE WO CONTRAST Result Date: 03/27/2024 EXAM: CT HEAD WITHOUT _study_datetime_ TECHNIQUE: CT of the head was performed without the administration of intravenous contrast. Automated exposure control, iterative reconstruction, and/or weight based adjustment of the mA/kV was utilized to reduce the radiation dose to as low as reasonably achievable. COMPARISON: None available. CLINICAL HISTORY: FINDINGS:  BRAIN AND VENTRICLES: No acute intracranial hemorrhage. No mass effect or midline shift. No extra-axial fluid collection. Gray-white differentiation is maintained. No hydrocephalus. ORBITS: No acute abnormality. SINUSES AND MASTOIDS: Mild mucosal thickening in the ethmoid sinuses and right maxillary sinus. Mastoid air cells are clear. SOFT TISSUES AND SKULL: No acute skull fracture. No acute soft tissue abnormality. [place] Stroke Program Early CT (ASPECT) score: Ganglionic (caudate, internal capsule, lentiform nucleus, insula, M1-M3): 7 Supraganglionic (M4-M6): 3 Total: 10 IMPRESSION: 1. No acute intracranial abnormality. 2. Findings messaged to Dr. Lindzen via the [facility] messaging system at 9:08AM on 03/27/24. Electronically signed by: Donnice Mania MD 03/27/2024 09:09 AM EDT RP Workstation: [id number]    Cardiac Studies   Echo:    Prelim echo without significant abnormalities.    Patient Profile     68 y.o. male  without a cardiac history who is being seen today for the evaluation of bradycardia at the request of Dr. Avie.   Assessment & Plan    TIA:  Work up negative per neurology.  Narrowing of  the P2 segment of the right PCA. Plan ASA and Plavix .  Monitor ordered to rule out atrial fib.  We will follow up.  Bradycardia:  No rhythm needing pacing.    He has an out patient monitor ordered.  I will follow up.  He has follow up scheduled in our clinic.   CKD:  Chronic and follow as an out patient.    For questions or updates, please contact [facility] Please consult [email or web address] for contact info under Cardiology/STEMI.   Signed, Lynwood Schilling, MD  03/29/2024, 8:13 AM

## 2024-03-29 NOTE — Progress Notes (Signed)
*  PRELIMINARY RESULTS* Echocardiogram 2D Echocardiogram has been performed.  Adam Alvarado 03/29/2024, 10:05 AM

## 2024-03-29 NOTE — Discharge Instructions (Addendum)
 Adam Alvarado,  You were in the hospital with a TIA. Thankfully this does not appear to have caused a full stroke. You will go home on aspirin  (indefinitely) and Plavix  (for 3 weeks). You will follow-up with the neurologist. We were also watching your heart rate which is slow, but stable. You will follow-up with the cardiologist for this issue. Please also follow-up with your PCP. Your Nexium  was switched to Protonix  for while you are on Plavix  because of a medication interaction; you can resume after you have completed your Plavix  course.

## 2024-03-29 NOTE — Plan of Care (Signed)

## 2024-04-21 NOTE — Progress Notes (Unsigned)
 PATIENT: Adam Alvarado DOB: 25-May-1956  REASON FOR VISIT: follow up HISTORY FROM: patient PRIMARY NEUROLOGIST: Dr. Rosemarie  No chief complaint on file.    HISTORY OF PRESENT ILLNESS: Today   Adam Alvarado is a 68 y.o. male who has been followed in this office for ***. Returns today for follow-up.   Imaging:   CTA head/neck: IMPRESSION: 1. No large vessel occlusion. 2. Focal moderate narrowing of the P2 segment of the right PCA. 3. Mild atherosclerosis as above.  MRI BRAIN:  IMPRESSION: 1. No acute intracranial abnormalityIMPRESSION: 1. No acute intracranial abnormality  FU: Labs: Carotid dopplers? ECHO: Discharge note Work? OSA?   HISTORY (copied from Hospital): [name]. Searing is a 68 year old male with a history of gout and non-Hodgkin's lymphoma, admitted for acute onset left-sided numbness, weakness, and diaphoresis. Symptoms resolved within one hour en route to the hospital. NIHSS on admission was 0. Neurological examination is normal except for persistent circumoral, L hand, and L foot tingling. Despite normal neuroimaging, differential diagnosis includes stroke based on clinical assessment versus TIA. Patient will be discharged on 30 day cardiac monitor. Plan is to discharge tomorrow pending echo.    Acute Ischemic Infarct: Stroke determined by clinical assessment vs TIA Etiology:  Unknown, possible cardiac etiology Code Stroke CT head No acute abnormality. ASPECTS 10.  CTA head & neck        No LVO, focal moderate narrowing of the P2 segment of the right PCA. MRI no acute intracranial abnormality 2D Echo EF 60 to 65%.  Left atrial size normal.   LDL 48 HgbA1c 5.7 VTE prophylaxis -Lovenox  No antithrombotic prior to admission, now on aspirin  81 mg daily and clopidogrel  75 mg daily for 3 weeks and then aspirin  81 mg alone. Therapy recommendations:  No follow up needed  Disposition:  8/15     Atrial fibrillation Discharge on 30-day  monitor Follow-up outpatient   Symptomatic bradycardia Echo pending Consult cardiology     Dysphagia Patient has post-stroke dysphagia, SLP consulted       Diet    Diet Heart Room service appropriate? Yes; Fluid consistency: Thin        Advance diet as tolerated   Other Stroke Risk Factors Obesity, Body mass index is 31.39 kg/m., BMI >/= 30 associated with increased stroke risk, recommend weight loss, diet and exercise as appropriate  Coronary artery disease Obstructive sleep apnea   Other Active Problems CKD Stage IIIa History of non-hodgkin's lymphoma, in remission    REVIEW OF SYSTEMS: Out of a complete 14 system review of symptoms, the patient complains only of the following symptoms, and all other reviewed systems are negative.  ALLERGIES: No Known Allergies  HOME MEDICATIONS: Outpatient Medications Prior to Visit  Medication Sig Dispense Refill   albuterol  (VENTOLIN  HFA) 108 (90 Base) MCG/ACT inhaler Inhale 2 puffs into the lungs every 6 (six) hours as needed. (Patient not taking: Reported on 03/27/2024) 8 g 0   allopurinol  (ZYLOPRIM ) 300 MG tablet Take 300 mg by mouth daily.     aspirin  EC 81 MG tablet Take 1 tablet (81 mg total) by mouth daily. Swallow whole. 90 tablet 0   colchicine  0.6 MG tablet Take 1.2 mg at the first sign of a gout attack followed by 0.6 mg in 1 hour, 0.6 mg 8 hours if needed. Next day 1.2 mg twice daily as needed until flare resolves. 60 tablet 0   esomeprazole  (NEXIUM ) 40 MG capsule Take 1 capsule (40 mg total)  by mouth daily at 12 noon. 90 capsule 3   pantoprazole  (PROTONIX ) 40 MG tablet Take 1 tablet (40 mg total) by mouth daily for 18 days. 18 tablet 0   No facility-administered medications prior to visit.    PAST MEDICAL HISTORY: Past Medical History:  Diagnosis Date   Adenomatous colon polyp 05/2000   Barrett's esophagus    Cancer (HCC) 2013   Non-Hodgkins Lymphoma   H/O hiatal hernia    small seen on CT Scan   Hemorrhoid     Other malignant lymphomas, unspecified site, extranodal and solid organ sites 2013    PAST SURGICAL HISTORY: Past Surgical History:  Procedure Laterality Date   BIOPSY  08/20/2012   Procedure: BIOPSY;  Surgeon: Elon CHRISTELLA Pacini, MD;  Location: [facility] OR;  Service: General;  Laterality: N/A;  Laparoscopic biopsy of abdominal mesenteric mass   COLONOSCOPY     LAPAROSCOPY  08/20/2012   Procedure: LAPAROSCOPY DIAGNOSTIC;  Surgeon: Elon CHRISTELLA Pacini, MD;  Location: [facility] OR;  Service: General;  Laterality: N/A;   VASECTOMY  1998    FAMILY HISTORY: Family History  Problem Relation Age of Onset   Cancer Mother        Breast   Colon polyps Father    Breast cancer Brother    Colon cancer Neg Hx    Esophageal cancer Neg Hx    Pancreatic cancer Neg Hx    Stomach cancer Neg Hx    Liver disease Neg Hx     SOCIAL HISTORY: Social History   Socioeconomic History   Marital status: Married    Spouse name: Not on file   Number of children: Not on file   Years of education: Not on file   Highest education level: Not on file  Occupational History   Not on file  Tobacco Use   Smoking status: Never    Passive exposure: Never   Smokeless tobacco: Never  Vaping Use   Vaping status: Never Used  Substance and Sexual Activity   Alcohol use: Yes    Comment: on occassion   Drug use: No   Sexual activity: Never  Other Topics Concern   Not on file  Social History Narrative   Lives at home with wife.  ***   Social Drivers of Corporate investment banker Strain: Not on file  Food Insecurity: No Food Insecurity (03/27/2024)   Hunger Vital Sign    Worried About Running Out of Food in the Last Year: Never true    Ran Out of Food in the Last Year: Never true  Transportation Needs: No Transportation Needs (03/27/2024)   PRAPARE - Administrator, Civil Service (Medical): No    Lack of Transportation (Non-Medical): No  Physical Activity: Not on file  Stress: Not on file  Social  Connections: Socially Integrated (03/27/2024)   Social Connection and Isolation Panel    Frequency of Communication with Friends and Family: More than three times a week    Frequency of Social Gatherings with Friends and Family: More than three times a week    Attends Religious Services: 1 to 4 times per year    Active Member of [employer] or Organizations: No    Attends Banker Meetings: 1 to 4 times per year    Marital Status: Married  Catering manager Violence: Not At Risk (03/27/2024)   Humiliation, Afraid, Rape, and Kick questionnaire    Fear of Current or Ex-Partner: No    Emotionally Abused: No  Physically Abused: No    Sexually Abused: No      PHYSICAL EXAM  There were no vitals filed for this visit. There is no height or weight on file to calculate BMI.  Generalized: Well developed, in no acute distress   Neurological examination  Mentation: Alert oriented to time, place, history taking. Follows all commands speech and language fluent Cranial nerve II-XII: Pupils were equal round reactive to light. Extraocular movements were full, visual field were full on confrontational test. Facial sensation and strength were normal. Uvula tongue midline. Head turning and shoulder shrug  were normal and symmetric. Motor: The motor testing reveals 5 over 5 strength of all 4 extremities. Good symmetric motor tone is noted throughout.  Sensory: Sensory testing is intact to soft touch on all 4 extremities. No evidence of extinction is noted.  Coordination: Cerebellar testing reveals good finger-nose-finger and heel-to-shin bilaterally.  Gait and station: Gait is normal. Tandem gait is normal. Romberg is negative. No drift is seen.  Reflexes: Deep tendon reflexes are symmetric and normal bilaterally.   DIAGNOSTIC DATA (LABS, IMAGING, TESTING) - I reviewed patient records, labs, notes, testing and imaging myself where available.  Lab Results  Component Value Date   WBC 5.5  03/27/2024   HGB 14.3 03/27/2024   HCT 42.0 03/27/2024   MCV 92.0 03/27/2024   PLT 171 03/27/2024      Component Value Date/Time   NA 142 03/27/2024 0845   NA 143 01/09/2014 0943   K 4.3 03/27/2024 0845   K 4.3 01/09/2014 0943   CL 108 03/27/2024 0845   CL 108 (H) 01/09/2013 0911   CO2 23 03/27/2024 0842   CO2 22 01/09/2014 0943   GLUCOSE 128 (H) 03/27/2024 0845   GLUCOSE 95 01/09/2014 0943   GLUCOSE 134 (H) 01/09/2013 0911   BUN 17 03/27/2024 0845   BUN 16.7 01/09/2014 0943   CREATININE 1.30 (H) 03/27/2024 0845   CREATININE 1.3 01/09/2014 0943   CALCIUM 8.3 (L) 03/27/2024 0842   CALCIUM 9.1 01/09/2014 0943   PROT 6.0 (L) 03/27/2024 0842   PROT 6.7 01/09/2014 0943   ALBUMIN 3.5 03/27/2024 0842   ALBUMIN 4.2 01/09/2014 0943   AST 26 03/27/2024 0842   AST 24 01/09/2014 0943   ALT 20 03/27/2024 0842   ALT 31 01/09/2014 0943   ALKPHOS 66 03/27/2024 0842   ALKPHOS 81 01/09/2014 0943   BILITOT 1.5 (H) 03/27/2024 0842   BILITOT 0.74 01/09/2014 0943   GFRNONAA 54 (L) 03/27/2024 0842   GFRAA >90 12/19/2012 1107   Lab Results  Component Value Date   CHOL 110 03/28/2024   HDL 38 (L) 03/28/2024   LDLCALC 48 03/28/2024   TRIG 118 03/28/2024   CHOLHDL 2.9 03/28/2024   Lab Results  Component Value Date   HGBA1C 5.7 (H) 03/27/2024   No results found for: VITAMINB12 Lab Results  Component Value Date   TSH 1.592 03/28/2024      ASSESSMENT AND PLAN 68 y.o. year old male  has a past medical history of Adenomatous colon polyp (05/2000), Barrett's esophagus, Cancer (HCC) (2013), H/O hiatal hernia, Hemorrhoid, and Other malignant lymphomas, unspecified site, extranodal and solid organ sites (2013). here with ***     Continue aspirin  81 mg daily  and ***  for secondary stroke prevention.   Discussed secondary stroke prevention measures and importance of close PCP follow up for aggressive stroke risk factor management. I have gone over the pathophysiology of stroke,  warning signs and symptoms,  risk factors and their management in some detail with instructions to go to the closest emergency room for symptoms of concern. HTN: BP goal <130/90.  Stable on *** per PCP HLD: LDL goal <70. Recent LDL 48.  DMII: A1c goal<7.0. Recent A1c 5.7.  Encouraged patient to monitor diet and encouraged exercise FU with our office ***  No orders of the defined types were placed in this encounter.  No orders of the defined types were placed in this encounter.     Duwaine Grove, MSN, NP-C 04/21/2024, 4:16 PM [facility] [address], [address] [place], [place] [address] [phone]

## 2024-04-22 ENCOUNTER — Ambulatory Visit (INDEPENDENT_AMBULATORY_CARE_PROVIDER_SITE_OTHER): Payer: Self-pay | Admitting: Adult Health

## 2024-04-22 ENCOUNTER — Encounter: Payer: Self-pay | Admitting: Adult Health

## 2024-04-22 VITALS — BP 136/66 | HR 58 | Ht 71.0 in | Wt 230.8 lb

## 2024-04-22 DIAGNOSIS — G459 Transient cerebral ischemic attack, unspecified: Secondary | ICD-10-CM | POA: Diagnosis not present

## 2024-04-22 DIAGNOSIS — I639 Cerebral infarction, unspecified: Secondary | ICD-10-CM | POA: Diagnosis not present

## 2024-04-22 NOTE — Patient Instructions (Signed)
Your Plan:  Continue Aspirin  Blood pressure goal <130/90 Cholesterol LDL goal <70 Diabetes goal A1c <7 Monitor diet and try to exercise   Thank you for coming to see us at [facility]. I hope we have been able to provide you high quality care today.  You may receive a patient satisfaction survey over the next few weeks. We would appreciate your feedback and comments so that we may continue to improve ourselves and the health of our patients.  

## 2024-04-23 ENCOUNTER — Telehealth: Payer: Self-pay | Admitting: Adult Health

## 2024-04-23 NOTE — Telephone Encounter (Signed)
 Patient called to schedule 6 month follow up

## 2024-05-06 ENCOUNTER — Encounter: Payer: Self-pay | Admitting: Adult Health

## 2024-05-06 DIAGNOSIS — G4733 Obstructive sleep apnea (adult) (pediatric): Secondary | ICD-10-CM

## 2024-05-07 ENCOUNTER — Ambulatory Visit: Attending: Cardiology

## 2024-05-07 DIAGNOSIS — G459 Transient cerebral ischemic attack, unspecified: Secondary | ICD-10-CM

## 2024-05-07 DIAGNOSIS — R001 Bradycardia, unspecified: Secondary | ICD-10-CM

## 2024-05-07 NOTE — Telephone Encounter (Signed)
 I received a sleep study which showed moderate sleep apnea.  I reviewed with sleep lab manager as well.  If the patient is amenable we will go ahead AutoPap for him-5-15 cmH2O with EPR 2.  This is the recommended treatment although we could discuss other treatment options such as dental device, weight loss.  If the patient starts CPAP therapy I will be happy to see him back in the office for compliance visit.  If patient is amenable please let me know and I will place order

## 2024-05-08 NOTE — Telephone Encounter (Addendum)
 I called pt and relayed that [facility] NP did receive sleep study.  Recommends autopap but other alternatives are dental device, weight loss. He is ok to start autopap.  DME [employer]. They will call him with insurance authorization (get machine, supplies, instruction, and mask fit). I explained about compliance and seeing back in 2-3 months.  Use machine all night, 4 hr a minimum.  Order placed for sing then fax to [employer].

## 2024-05-08 NOTE — Addendum Note (Signed)
 Addended by: NEYSA NENA RAMAN on: 05/08/2024 02:10 PM   Modules accepted: Orders

## 2024-05-09 ENCOUNTER — Telehealth: Payer: Self-pay | Admitting: Student

## 2024-05-09 NOTE — Telephone Encounter (Signed)
 Romero is requesting a callback from [place] regarding them wanting to discuss pt test results with her. Please advise

## 2024-05-09 NOTE — Telephone Encounter (Signed)
 Placed sleep study in box

## 2024-05-09 NOTE — Telephone Encounter (Signed)
 Spoke with [facility] and she stated that they located the Zio monitor result.

## 2024-05-09 NOTE — Addendum Note (Signed)
 Addended by: SHERRYL DUWAINE SQUIBB on: 05/09/2024 06:18 PM   Modules accepted: Orders

## 2024-05-13 ENCOUNTER — Ambulatory Visit: Payer: Self-pay | Admitting: Cardiology

## 2024-05-13 DIAGNOSIS — G459 Transient cerebral ischemic attack, unspecified: Secondary | ICD-10-CM

## 2024-05-13 DIAGNOSIS — R001 Bradycardia, unspecified: Secondary | ICD-10-CM | POA: Diagnosis not present

## 2024-05-13 NOTE — Telephone Encounter (Signed)
 Faxed to [employer] the order and documents for new start of PAP.  Fax confirmation received.

## 2024-05-17 ENCOUNTER — Ambulatory Visit: Attending: Student | Admitting: Student

## 2024-05-17 ENCOUNTER — Encounter: Payer: Self-pay | Admitting: Student

## 2024-05-17 VITALS — BP 122/78 | HR 54 | Ht 71.0 in | Wt 226.2 lb

## 2024-05-17 DIAGNOSIS — G459 Transient cerebral ischemic attack, unspecified: Secondary | ICD-10-CM | POA: Insufficient documentation

## 2024-05-17 DIAGNOSIS — R001 Bradycardia, unspecified: Secondary | ICD-10-CM | POA: Diagnosis present

## 2024-05-17 NOTE — Patient Instructions (Signed)
 Medication Instructions:  Your physician recommends that you continue on your current medications as directed. Please refer to the Current Medication list given to you today.  *If you need a refill on your cardiac medications before your next appointment, please call your pharmacy*  Lab Work: NONE   If you have labs (blood work) drawn today and your tests are completely normal, you will receive your results only by: MyChart Message (if you have MyChart) OR A paper copy in the mail If you have any lab test that is abnormal or we need to change your treatment, we will call you to review the results.  Testing/Procedures: NONE   Follow-Up: At [facility], you and your health needs are our priority.  As part of our continuing mission to provide you with exceptional heart care, our providers are all part of one team.  This team includes your primary Cardiologist (physician) and Advanced Practice Providers or APPs (Physician Assistants and Nurse Practitioners) who all work together to provide you with the care you need, when you need it.  Your next appointment:    As Needed   Provider:   You may see Lynwood Schilling, MD or one of the following Advanced Practice Providers on your designated Care Team:   Laymon Qua, PA-C  [place], [place] Olivia [place], [place]     We recommend signing up for the patient portal called MyChart.  Sign up information is provided on this After Visit Summary.  MyChart is used to connect with patients for Virtual Visits (Telemedicine).  Patients are able to view lab/test results, encounter notes, upcoming appointments, etc.  Non-urgent messages can be sent to your provider as well.   To learn more about what you can do with MyChart, go to [email or web address].   Other Instructions Thank you for choosing [facility]!

## 2024-05-17 NOTE — Progress Notes (Signed)
 Cardiology Office Note    Date:  05/17/2024  ID:  Adam Alvarado, DOB 21-Apr-1956, MRN [id number] Cardiologist: Lynwood Schilling, MD { : History of Present Illness:    Adam Alvarado is a 68 y.o. male with past medical history of non-Hodgkin's lymphoma, Stage III CKD, recent TIA and bradycardia who presents to the office today for hospital follow-up.  He was most recently admitted to [facility] in 03/2024 for evaluation of left-sided weakness and numbness of his left face. Code stroke was activated and CT Head and MRI showed no acute intracranial abnormalities. He was evaluated by Neurology and symptoms were felt to be most consistent with a TIA vs. CVA based on clinical assessment as symptoms persisted. It was recommended to be on ASA and Plavix  for 3 weeks and then ASA 81 mg alone. Cardiology was consulted during admission as EMS had initially reported his heart rate was as low as 25 bpm and while admitted, his heart rate was consistently in the 40's. An outpatient monitor was recommended for further assessment. This showed predominantly normal sinus rhythm with 1 run of NSVT for 7 beats but no sustained arrhythmias or episodes of atrial fibrillation.  In talking with the patient and his wife today, he reports overall doing well since his recent admission. Says that he still feels a tingling sensation along his left arm, left chest, left leg and the left side of his face. Says this is gradually improving. He has resumed doing his normal activities and denies any specific chest pain, dyspnea on exertion, palpitations, orthopnea, PND or pitting edema. Was recently diagnosed with sleep apnea and is awaiting approval from his insurance for his CPAP machine.   Studies Reviewed:   EKG: EKG is not ordered today.  Echocardiogram: 03/2024 IMPRESSIONS     1. Left ventricular ejection fraction, by estimation, is 60 to 65%. The  left ventricle has normal function. The left ventricle has no regional   wall motion abnormalities. Left ventricular diastolic parameters were  normal.   2. Right ventricular systolic function is normal. The right ventricular  size is normal.   3. The mitral valve is normal in structure. Trivial mitral valve  regurgitation.   4. The aortic valve is tricuspid. Aortic valve regurgitation is not  visualized. Aortic valve sclerosis is present, with no evidence of aortic  valve stenosis.    Event Monitor: 04/2024 Normal sinus rhythm One run of non sustained ventricular tachycardia lasting 7 beats No sustained arrhythmias   Physical Exam:   VS:  BP 122/78 (BP Location: Left Arm, Cuff Size: Normal)   Pulse (!) 54   Ht 5' 11 (1.803 m)   Wt 226 lb 3.2 oz (102.6 kg)   SpO2 96%   BMI 31.55 kg/m    Wt Readings from Last 3 Encounters:  05/17/24 226 lb 3.2 oz (102.6 kg)  04/22/24 230 lb 12.8 oz (104.7 kg)  03/27/24 231 lb 7.7 oz (105 kg)     GEN: Pleasant male appearing in no acute distress NECK: No JVD; No carotid bruits CARDIAC: RRR, no murmurs, rubs, gallops RESPIRATORY:  Clear to auscultation without rales, wheezing or rhonchi  ABDOMEN: Appears non-distended. No obvious abdominal masses. EXTREMITIES: No clubbing or cyanosis. No pitting edema.  Distal pedal pulses are 2+ bilaterally.   Assessment and Plan:   1. Bradycardia - Recent monitor showed predominantly normal sinus rhythm with an average heart rate of 61 bpm. No significant pauses or high-grade AV block. Also, no documented atrial  fibrillation.  - Encouraged him to continue to follow heart rate in the ambulatory setting. No indication for PPM placement at this time. Would continue to avoid AV nodal blocking agents.  2. TIA (transient ischemic attack) - Symptoms during recent admission were felt to be consistent with a TIA or small CVA but not visible on imaging. He was on DAPT for 3 weeks then ASA 81 mg alone. Followed by Neurology. Remains on Crestor 10 mg daily and LDL was at 48 when  checked in 03/2024.   Disposition: He prefers to follow-up on a PRN basis.   Signed, Laymon CHRISTELLA Qua, PA-C

## 2024-08-14 NOTE — Progress Notes (Signed)
 Adam Alvarado

## 2024-08-19 ENCOUNTER — Ambulatory Visit (INDEPENDENT_AMBULATORY_CARE_PROVIDER_SITE_OTHER): Admitting: Adult Health

## 2024-08-19 ENCOUNTER — Encounter: Payer: Self-pay | Admitting: Adult Health

## 2024-08-19 VITALS — BP 140/69 | HR 59 | Ht 71.0 in | Wt 224.2 lb

## 2024-08-19 DIAGNOSIS — G4733 Obstructive sleep apnea (adult) (pediatric): Secondary | ICD-10-CM | POA: Diagnosis not present

## 2024-08-19 NOTE — Progress Notes (Signed)
 "   PATIENT: Adam Alvarado DOB: March 05, 1956  REASON FOR VISIT: follow up HISTORY FROM: patient PRIMARY NEUROLOGIST: Dr. Rosemarie  Chief Complaint  Patient presents with   Follow-up    Pt in 4 with wife Pt here for cpap f/u Pt states stroke 03/2024     HISTORY OF PRESENT ILLNESS: Today 08/19/2024:  Adam Alvarado is a 69 y.o. male with a history of obstructive sleep apnea on CPAP. Returns today for follow-up.  Patient had his sleep study through his PCP that showed moderate sleep apnea.  He asked if I could manage his CPAP therapy which I agreed since we are following him for stroke.  He reports that the CPAP has been beneficial.  He does find that he is not falling asleep as easy.  Not as tired.  He does state that it is cumbersome to use and clean.  He currently has a nasal mask.  His download is below     HISTORY   REVIEW OF SYSTEMS: Out of a complete 14 system review of symptoms, the patient complains only of the following symptoms, and all other reviewed systems are negative.  FSS ESS  ALLERGIES: Allergies[1]  HOME MEDICATIONS: Outpatient Medications Prior to Visit  Medication Sig Dispense Refill   allopurinol  (ZYLOPRIM ) 300 MG tablet Take 300 mg by mouth daily.     aspirin  EC 81 MG tablet Take 1 tablet (81 mg total) by mouth daily. Swallow whole. 90 tablet 0   colchicine  0.6 MG tablet Take 1.2 mg at the first sign of a gout attack followed by 0.6 mg in 1 hour, 0.6 mg 8 hours if needed. Next day 1.2 mg twice daily as needed until flare resolves. 60 tablet 0   esomeprazole  (NEXIUM ) 40 MG capsule Take 1 capsule (40 mg total) by mouth daily at 12 noon. 90 capsule 3   rosuvastatin (CRESTOR) 10 MG tablet Take 10 mg by mouth daily.     No facility-administered medications prior to visit.    PAST MEDICAL HISTORY: Past Medical History:  Diagnosis Date   Adenomatous colon polyp 05/2000   Barrett's esophagus    Cancer (HCC) 2013   Non-Hodgkins Lymphoma   H/O hiatal hernia     small seen on CT Scan   Hemorrhoid    Other malignant lymphomas, unspecified site, extranodal and solid organ sites 2013    PAST SURGICAL HISTORY: Past Surgical History:  Procedure Laterality Date   BIOPSY  08/20/2012   Procedure: BIOPSY;  Surgeon: Elon CHRISTELLA Pacini, MD;  Location: [facility] OR;  Service: General;  Laterality: N/A;  Laparoscopic biopsy of abdominal mesenteric mass   COLONOSCOPY     LAPAROSCOPY  08/20/2012   Procedure: LAPAROSCOPY DIAGNOSTIC;  Surgeon: Elon CHRISTELLA Pacini, MD;  Location: [facility] OR;  Service: General;  Laterality: N/A;   VASECTOMY  1998    FAMILY HISTORY: Family History  Problem Relation Age of Onset   Cancer Mother        Breast   Colon polyps Father    Breast cancer Brother    Colon cancer Neg Hx    Esophageal cancer Neg Hx    Pancreatic cancer Neg Hx    Stomach cancer Neg Hx    Liver disease Neg Hx    Transient ischemic attack Neg Hx    Stroke Neg Hx     SOCIAL HISTORY: Social History   Socioeconomic History   Marital status: Married    Spouse name: Not on file   Number of  children: Not on file   Years of education: Not on file   Highest education level: Not on file  Occupational History   Not on file  Tobacco Use   Smoking status: Never    Passive exposure: Never   Smokeless tobacco: Never  Vaping Use   Vaping status: Never Used  Substance and Sexual Activity   Alcohol use: Yes    Comment: on occassion   Drug use: No   Sexual activity: Never  Other Topics Concern   Not on file  Social History Narrative   Lives at home with wife.     Pt works    Social Drivers of Health   Tobacco Use: Low Risk (08/19/2024)   Patient History    Smoking Tobacco Use: Never    Smokeless Tobacco Use: Never    Passive Exposure: Never  Financial Resource Strain: Not on file  Food Insecurity: No Food Insecurity (03/27/2024)   Epic    Worried About Programme Researcher, Broadcasting/film/video in the Last Year: Never true    Ran Out of Food in the Last Year: Never true   Transportation Needs: No Transportation Needs (03/27/2024)   Epic    Lack of Transportation (Medical): No    Lack of Transportation (Non-Medical): No  Physical Activity: Not on file  Stress: Not on file  Social Connections: Socially Integrated (03/27/2024)   Social Connection and Isolation Panel    Frequency of Communication with Friends and Family: More than three times a week    Frequency of Social Gatherings with Friends and Family: More than three times a week    Attends Religious Services: 1 to 4 times per year    Active Member of [employer] or Organizations: No    Attends Banker Meetings: 1 to 4 times per year    Marital Status: Married  Catering Manager Violence: Not At Risk (03/27/2024)   Epic    Fear of Current or Ex-Partner: No    Emotionally Abused: No    Physically Abused: No    Sexually Abused: No  Depression (PHQ2-9): Not on file  Alcohol Screen: Not on file  Housing: Low Risk (03/27/2024)   Epic    Unable to Pay for Housing in the Last Year: No    Number of Times Moved in the Last Year: 0    Homeless in the Last Year: No  Utilities: Not At Risk (03/27/2024)   Epic    Threatened with loss of utilities: No  Health Literacy: Not on file      PHYSICAL EXAM  Vitals:   08/19/24 0844  BP: (!) 140/69  Pulse: (!) 59  Weight: 224 lb 3.2 oz (101.7 kg)  Height: 5' 11 (1.803 m)   Body mass index is 31.27 kg/m.  Generalized: Well developed, in no acute distress  Chest: Lungs clear to auscultation bilaterally  Neurological examination  Mentation: Alert oriented to time, place, history taking. Follows all commands speech and language fluent Cranial nerve II-XII: Extraocular movements were full, visual field were full on confrontational test Head turning and shoulder shrug  were normal and symmetric. Motor: The motor testing reveals 5 over 5 strength of all 4 extremities. Good symmetric motor tone is noted throughout.  Sensory: Sensory testing is intact to  soft touch on all 4 extremities. No evidence of extinction is noted.  Gait and station: Gait is normal.    DIAGNOSTIC DATA (LABS, IMAGING, TESTING) - I reviewed patient records, labs, notes, testing and imaging myself  where available.  Lab Results  Component Value Date   WBC 5.5 03/27/2024   HGB 14.3 03/27/2024   HCT 42.0 03/27/2024   MCV 92.0 03/27/2024   PLT 171 03/27/2024      Component Value Date/Time   NA 142 03/27/2024 0845   NA 143 01/09/2014 0943   K 4.3 03/27/2024 0845   K 4.3 01/09/2014 0943   CL 108 03/27/2024 0845   CL 108 (H) 01/09/2013 0911   CO2 23 03/27/2024 0842   CO2 22 01/09/2014 0943   GLUCOSE 128 (H) 03/27/2024 0845   GLUCOSE 95 01/09/2014 0943   GLUCOSE 134 (H) 01/09/2013 0911   BUN 17 03/27/2024 0845   BUN 16.7 01/09/2014 0943   CREATININE 1.30 (H) 03/27/2024 0845   CREATININE 1.3 01/09/2014 0943   CALCIUM 8.3 (L) 03/27/2024 0842   CALCIUM 9.1 01/09/2014 0943   PROT 6.0 (L) 03/27/2024 0842   PROT 6.7 01/09/2014 0943   ALBUMIN 3.5 03/27/2024 0842   ALBUMIN 4.2 01/09/2014 0943   AST 26 03/27/2024 0842   AST 24 01/09/2014 0943   ALT 20 03/27/2024 0842   ALT 31 01/09/2014 0943   ALKPHOS 66 03/27/2024 0842   ALKPHOS 81 01/09/2014 0943   BILITOT 1.5 (H) 03/27/2024 0842   BILITOT 0.74 01/09/2014 0943   GFRNONAA 54 (L) 03/27/2024 0842   GFRAA >90 12/19/2012 1107   Lab Results  Component Value Date   CHOL 110 03/28/2024   HDL 38 (L) 03/28/2024   LDLCALC 48 03/28/2024   TRIG 118 03/28/2024   CHOLHDL 2.9 03/28/2024   Lab Results  Component Value Date   HGBA1C 5.7 (H) 03/27/2024   No results found for: VITAMINB12 Lab Results  Component Value Date   TSH 1.592 03/28/2024      ASSESSMENT AND PLAN 68 y.o. year old male  has a past medical history of Adenomatous colon polyp (05/2000), Barrett's esophagus, Cancer (HCC) (2013), H/O hiatal hernia, Hemorrhoid, and Other malignant lymphomas, unspecified site, extranodal and solid organ sites  (2013). here with:  OSA on CPAP History of stroke  - CPAP compliance excellent - Good treatment of AHI  - Encourage patient to use CPAP nightly and > 4 hours each night -Encouraged him to change out supplies regularly - Keep appointment in June to follow-up for stroke  Duwaine Simpson, MSN, NP-C 08/19/2024, 8:45 AM [facility] [address], [address] [place], [place] [address] [phone]  The patient's condition requires frequent monitoring and adjustments in the treatment plan, reflecting the ongoing complexity of care.  This provider is the continuing focal point for all needed services for this condition.       [1] No Known Allergies  "

## 2024-08-19 NOTE — Patient Instructions (Signed)
 Continue using CPAP nightly and greater than 4 hours each night Change after mask monthly. If your symptoms worsen or you develop new symptoms please let us  know.

## 2025-01-22 ENCOUNTER — Ambulatory Visit: Admitting: Adult Health
